# Patient Record
Sex: Male | Born: 1950 | Race: White | Hispanic: No | Marital: Married | Smoking: Current every day smoker
Health system: Southern US, Community
[De-identification: ages and names within clinical notes are randomized; demographics above are authoritative.]

## PROBLEM LIST (undated history)

## (undated) DIAGNOSIS — Z720 Tobacco use: Secondary | ICD-10-CM

## (undated) DIAGNOSIS — Z9289 Personal history of other medical treatment: Secondary | ICD-10-CM

## (undated) DIAGNOSIS — I4510 Unspecified right bundle-branch block: Secondary | ICD-10-CM

## (undated) DIAGNOSIS — K219 Gastro-esophageal reflux disease without esophagitis: Secondary | ICD-10-CM

## (undated) DIAGNOSIS — N200 Calculus of kidney: Secondary | ICD-10-CM

## (undated) DIAGNOSIS — K8309 Other cholangitis: Secondary | ICD-10-CM

## (undated) DIAGNOSIS — M1990 Unspecified osteoarthritis, unspecified site: Secondary | ICD-10-CM

## (undated) DIAGNOSIS — R55 Syncope and collapse: Secondary | ICD-10-CM

## (undated) DIAGNOSIS — E785 Hyperlipidemia, unspecified: Secondary | ICD-10-CM

## (undated) DIAGNOSIS — I2510 Atherosclerotic heart disease of native coronary artery without angina pectoris: Secondary | ICD-10-CM

## (undated) DIAGNOSIS — E119 Type 2 diabetes mellitus without complications: Secondary | ICD-10-CM

## (undated) DIAGNOSIS — T63311A Toxic effect of venom of black widow spider, accidental (unintentional), initial encounter: Secondary | ICD-10-CM

## (undated) DIAGNOSIS — R7309 Other abnormal glucose: Secondary | ICD-10-CM

## (undated) HISTORY — PX: EYE SURGERY: SHX253

## (undated) HISTORY — DX: Hyperlipidemia, unspecified: E78.5

## (undated) HISTORY — DX: Atherosclerotic heart disease of native coronary artery without angina pectoris: I25.10

## (undated) HISTORY — DX: Personal history of other medical treatment: Z92.89

## (undated) HISTORY — DX: Morbid (severe) obesity due to excess calories: E66.01

## (undated) HISTORY — DX: Type 2 diabetes mellitus without complications: E11.9

## (undated) HISTORY — DX: Unspecified right bundle-branch block: I45.10

## (undated) HISTORY — DX: Calculus of kidney: N20.0

## (undated) HISTORY — DX: Tobacco use: Z72.0

## (undated) HISTORY — DX: Gastro-esophageal reflux disease without esophagitis: K21.9

## (undated) HISTORY — DX: Other abnormal glucose: R73.09

## (undated) HISTORY — DX: Toxic effect of venom of black widow spider, accidental (unintentional), initial encounter: T63.311A

## (undated) HISTORY — DX: Other cholangitis: K83.09

## (undated) HISTORY — DX: Unspecified osteoarthritis, unspecified site: M19.90

---

## 1977-08-23 DIAGNOSIS — T63311A Toxic effect of venom of black widow spider, accidental (unintentional), initial encounter: Secondary | ICD-10-CM

## 1977-08-23 HISTORY — DX: Toxic effect of venom of black widow spider, accidental (unintentional), initial encounter: T63.311A

## 1997-12-21 DIAGNOSIS — E785 Hyperlipidemia, unspecified: Secondary | ICD-10-CM | POA: Insufficient documentation

## 1997-12-21 DIAGNOSIS — H9190 Unspecified hearing loss, unspecified ear: Secondary | ICD-10-CM | POA: Insufficient documentation

## 1997-12-21 DIAGNOSIS — E782 Mixed hyperlipidemia: Secondary | ICD-10-CM | POA: Insufficient documentation

## 1997-12-21 HISTORY — DX: Hyperlipidemia, unspecified: E78.5

## 1998-05-09 ENCOUNTER — Emergency Department (HOSPITAL_COMMUNITY): Admission: EM | Admit: 1998-05-09 | Discharge: 1998-05-09 | Payer: Self-pay | Admitting: Emergency Medicine

## 1998-07-23 HISTORY — PX: OTHER SURGICAL HISTORY: SHX169

## 1998-08-12 ENCOUNTER — Ambulatory Visit (HOSPITAL_COMMUNITY): Admission: RE | Admit: 1998-08-12 | Discharge: 1998-08-12 | Payer: Self-pay | Admitting: Family Medicine

## 1998-08-12 ENCOUNTER — Encounter: Payer: Self-pay | Admitting: Family Medicine

## 2004-05-23 ENCOUNTER — Encounter: Payer: Self-pay | Admitting: Family Medicine

## 2004-05-23 LAB — CONVERTED CEMR LAB: Hgb A1c MFr Bld: 5.5 %

## 2004-05-27 DIAGNOSIS — E119 Type 2 diabetes mellitus without complications: Secondary | ICD-10-CM | POA: Insufficient documentation

## 2004-06-08 ENCOUNTER — Observation Stay (HOSPITAL_COMMUNITY): Admission: EM | Admit: 2004-06-08 | Discharge: 2004-06-09 | Payer: Self-pay | Admitting: Emergency Medicine

## 2004-06-08 HISTORY — PX: OTHER SURGICAL HISTORY: SHX169

## 2006-07-29 ENCOUNTER — Ambulatory Visit: Payer: Self-pay | Admitting: Family Medicine

## 2006-08-23 ENCOUNTER — Encounter: Payer: Self-pay | Admitting: Family Medicine

## 2006-08-23 LAB — CONVERTED CEMR LAB: PSA: 0.29 ng/mL

## 2006-08-31 ENCOUNTER — Ambulatory Visit: Payer: Self-pay | Admitting: Family Medicine

## 2006-09-01 ENCOUNTER — Encounter: Payer: Self-pay | Admitting: Family Medicine

## 2006-09-01 LAB — CONVERTED CEMR LAB: Glucose, Bld: 84 mg/dL (ref 70–99)

## 2006-09-21 ENCOUNTER — Ambulatory Visit: Payer: Self-pay | Admitting: Family Medicine

## 2006-10-26 DIAGNOSIS — G5600 Carpal tunnel syndrome, unspecified upper limb: Secondary | ICD-10-CM | POA: Insufficient documentation

## 2007-01-13 ENCOUNTER — Ambulatory Visit: Payer: Self-pay | Admitting: Family Medicine

## 2007-01-13 DIAGNOSIS — J209 Acute bronchitis, unspecified: Secondary | ICD-10-CM

## 2007-07-11 ENCOUNTER — Ambulatory Visit (HOSPITAL_COMMUNITY): Admission: RE | Admit: 2007-07-11 | Discharge: 2007-07-11 | Payer: Self-pay | Admitting: Specialist

## 2007-12-06 ENCOUNTER — Encounter (INDEPENDENT_AMBULATORY_CARE_PROVIDER_SITE_OTHER): Payer: Self-pay | Admitting: Internal Medicine

## 2008-05-10 DIAGNOSIS — K625 Hemorrhage of anus and rectum: Secondary | ICD-10-CM

## 2008-05-15 ENCOUNTER — Ambulatory Visit: Payer: Self-pay | Admitting: Family Medicine

## 2008-05-15 DIAGNOSIS — N200 Calculus of kidney: Secondary | ICD-10-CM

## 2008-05-29 LAB — CONVERTED CEMR LAB
ALT: 28 units/L (ref 0–53)
AST: 21 units/L (ref 0–37)
Albumin: 4.2 g/dL (ref 3.5–5.2)
Alkaline Phosphatase: 65 units/L (ref 39–117)
BUN: 15 mg/dL (ref 6–23)
Basophils Absolute: 0.1 10*3/uL (ref 0.0–0.1)
Basophils Relative: 1 % (ref 0.0–3.0)
Bilirubin, Direct: 0.2 mg/dL (ref 0.0–0.3)
CO2: 30 meq/L (ref 19–32)
Calcium: 9.6 mg/dL (ref 8.4–10.5)
Chloride: 111 meq/L (ref 96–112)
Cholesterol: 173 mg/dL (ref 0–200)
Creatinine, Ser: 1.1 mg/dL (ref 0.4–1.5)
Eosinophils Absolute: 0.4 10*3/uL (ref 0.0–0.7)
Eosinophils Relative: 5.5 % — ABNORMAL HIGH (ref 0.0–5.0)
GFR calc Af Amer: 88 mL/min
GFR calc non Af Amer: 73 mL/min
Glucose, Bld: 105 mg/dL — ABNORMAL HIGH (ref 70–99)
HCT: 48.4 % (ref 39.0–52.0)
HDL: 28.8 mg/dL — ABNORMAL LOW (ref >39.0)
Hemoglobin: 17.3 g/dL — ABNORMAL HIGH (ref 13.0–17.0)
LDL Cholesterol: 125 mg/dL — ABNORMAL HIGH (ref 0–99)
Lymphocytes Relative: 18.4 % (ref 12.0–46.0)
MCHC: 35.6 g/dL (ref 30.0–36.0)
MCV: 94.9 fL (ref 78.0–100.0)
Monocytes Absolute: 0.7 10*3/uL (ref 0.1–1.0)
Monocytes Relative: 9.2 % (ref 3.0–12.0)
Neutro Abs: 4.6 10*3/uL (ref 1.4–7.7)
Neutrophils Relative %: 65.9 % (ref 43.0–77.0)
PSA: 0.54 ng/mL (ref 0.10–4.00)
Platelets: 144 10*3/uL — ABNORMAL LOW (ref 150–400)
Potassium: 4.7 meq/L (ref 3.5–5.1)
RBC: 5.11 M/uL (ref 4.22–5.81)
RDW: 12.2 % (ref 11.5–14.6)
Sodium: 143 meq/L (ref 135–145)
TSH: 1.85 microintl units/mL (ref 0.35–5.50)
Total Bilirubin: 1 mg/dL (ref 0.3–1.2)
Total CHOL/HDL Ratio: 6
Total Protein: 7.1 g/dL (ref 6.0–8.3)
Triglycerides: 95 mg/dL (ref 0–149)
VLDL: 19 mg/dL (ref 0–40)
WBC: 7.1 10*3/uL (ref 4.5–10.5)

## 2008-05-30 ENCOUNTER — Encounter: Payer: Self-pay | Admitting: Family Medicine

## 2008-05-30 DIAGNOSIS — F17200 Nicotine dependence, unspecified, uncomplicated: Secondary | ICD-10-CM

## 2008-06-03 ENCOUNTER — Encounter: Admission: RE | Admit: 2008-06-03 | Discharge: 2008-06-03 | Payer: Self-pay | Admitting: Internal Medicine

## 2008-11-01 ENCOUNTER — Inpatient Hospital Stay (HOSPITAL_COMMUNITY): Admission: EM | Admit: 2008-11-01 | Discharge: 2008-11-05 | Payer: Self-pay | Admitting: Emergency Medicine

## 2008-11-01 ENCOUNTER — Encounter (INDEPENDENT_AMBULATORY_CARE_PROVIDER_SITE_OTHER): Payer: Self-pay | Admitting: General Surgery

## 2008-11-25 ENCOUNTER — Encounter (INDEPENDENT_AMBULATORY_CARE_PROVIDER_SITE_OTHER): Payer: Self-pay | Admitting: Internal Medicine

## 2009-01-27 ENCOUNTER — Encounter (INDEPENDENT_AMBULATORY_CARE_PROVIDER_SITE_OTHER): Payer: Self-pay | Admitting: Internal Medicine

## 2009-02-13 ENCOUNTER — Ambulatory Visit (HOSPITAL_COMMUNITY): Admission: RE | Admit: 2009-02-13 | Discharge: 2009-02-14 | Payer: Self-pay | Admitting: Gastroenterology

## 2009-03-18 ENCOUNTER — Encounter (INDEPENDENT_AMBULATORY_CARE_PROVIDER_SITE_OTHER): Payer: Self-pay | Admitting: Internal Medicine

## 2009-04-10 ENCOUNTER — Ambulatory Visit (HOSPITAL_COMMUNITY): Admission: RE | Admit: 2009-04-10 | Discharge: 2009-04-10 | Payer: Self-pay | Admitting: Gastroenterology

## 2010-07-23 DIAGNOSIS — K8309 Other cholangitis: Secondary | ICD-10-CM

## 2010-07-23 HISTORY — DX: Other cholangitis: K83.09

## 2010-08-11 ENCOUNTER — Inpatient Hospital Stay (HOSPITAL_COMMUNITY)
Admission: EM | Admit: 2010-08-11 | Discharge: 2010-08-16 | Payer: Self-pay | Source: Home / Self Care | Attending: Internal Medicine | Admitting: Internal Medicine

## 2010-08-24 ENCOUNTER — Encounter: Payer: Self-pay | Admitting: Family Medicine

## 2010-09-19 NOTE — H&P (Signed)
NAME:  Carl Barnes, Carl Barnes NO.:  [id number]  MEDICAL RECORD NO.:  [id number]          PATIENT TYPE:  EMS  LOCATION:  MAJO                         FACILITY:  [facility]  PHYSICIAN:  Michiel Cowboy, MDDATE OF BIRTH:  03/18/1951  DATE OF ADMISSION:  08/12/2010 DATE OF DISCHARGE:                             HISTORY & PHYSICAL   PRIMARY PROVIDER:  Dr. Ether Griffins, she is not at [facility].  No primary physician in [place].  The patient has been seen by [facility].  CHIEF COMPLAINT:  Abdominal pain and feeling weak.  The patient is a 60 year old gentleman, status post cholecystectomy, but recurrent biliary obstruction requiring ERCP, history of kidney stones and gastroesophageal reflux.  The patient had been doing well up until yesterday when he developed low grade fevers, chills, intermittent left upper quadrant pain that rapidly resolved, pains and aches all over his body, myalgias and overall malaise, nausea and vomiting, although no diarrhea, no headache.  He had a little bit of sneezing and mild cough, but otherwise no other complaints.  Nobody in his family has been ill. He presented to the emergency department.  He states that the pain that he experienced yesterday did not seem to be similar to the pain he felt before when he had a biliary obstruction.  It did not seem to be similar to his biliary colic.  REVIEW OF SYSTEMS:  No chest pains, no shortness of breath, no diarrhea, no constipation.  No neurological complaints.  Otherwise, review of systems are negative.  PAST MEDICAL HISTORY: 1. Status post cholecystectomy. 2. Status post ERCP in August 2010, secondary to multiple common bile     duct stones. 3. History of GERD. 4. Remote history of kidney stone.  SOCIAL HISTORY:  The patient does not abuse drugs, but he does smoke about half pack a day.  He drinks about three drinks per week with his last drink being on Thursday.  FAMILY HISTORY:  Significant  for hypertension.  ALLERGIES:  CODEINE MAKES HIM NAUSEOUS.  MEDICATIONS:  He does not take any medications or supplements.  PHYSICAL EXAMINATION:  VITALS:  Temperature 97.9, blood pressure 97/55. His blood pressure reached 119/71, but it has drifted down since then, the latest one was 91/58.  Pulse 85, respirations 20.  Saturating 97% on room air. GENERAL:  The patient appears to be in no acute distress. HEENT:  Head nontraumatic.  Slightly dry mucous membranes.  Normal skin turgor. LUNGS:  Clear to auscultation bilaterally. HEART:  Regular rate and rhythm.  No murmurs appreciated. ABDOMEN:  Slightly obese, completely nontender, nondistended.  Normal bowel sounds appreciated.  LOWER EXTREMITIES:  Without clubbing, cyanosis or edema. NEUROLOGICALLY:  Grossly intact. SKIN:  Somewhat jaundiced, but mildly so.  LABORATORY DATA:  White blood cell count 14.6, hemoglobin 16.4, platelets 269.  Sodium 136, potassium 4.0, creatinine 1.6, INR 1.76, total bili of 5.2, direct of 3.1, AST 76, ALT 133, alk phos 79, albumin3.1, calcium 9.7, lactic acid 1.4.  UA is significant for nitrates and evidence of infection.  Blood cultures pending.  CT scan showing mild biliary dilatation, but otherwise no acute findings.  EKG showing heart rate of 75, perhaps early repolarization seen in lead V2.  Otherwise, no significant changes from prior.  ASSESSMENT/PLAN:  This is a 60 year old gentleman with a prior history of cholecystectomy, but recurrent biliary stones, who presented with mild abdominal pain, but overall malaise and abdominal pain completely resolved right now.  He was found to have a urinary tract infection and slightly dilated biliary ducts.  1. Abnormal liver function tests.  He has had transaminitis for quite     some time.  Interestingly, his alkaline phosphatase has not been     elevated.  He never really had a complete obstructive picture.  His     albumin had been low in the past and  today his INR is also high, as     well as he had some low platelets.  I am wondering if he has some     underlying liver disease.  He has had hepatic steatosis showing on     an ultrasound from a few years back.  We will for completion check     hepatitis serologies for his platelets and INR.  He may need to be     investigated by hematology as his platelets have been low for quite     some time now, although 69 is the lowest number it has gotten. 2. Urinary tract infection may have caused his current symptoms of     malaise.  We will cover with Zosyn in case he does have any biliary     system infection as well that should cover both urinary tract     infection and GI system.  We will give IV fluids and follow     him clinically. 3. History of biliary stones.  At this point, he has no Murphy's sign.     His abdomen is nontender.  He looks very comfortable.  He has no     fever.  I doubt that he has cholangitis, but his liver function     test abnormalities are surely there.  He will need a GI consult in     the a.m.  Dr. Matthias Hughs knows about him. 4. Slightly elevated creatinine.  We will give IV fluids and follow.     He does appear to be slightly dehydrated. 5. Generalized malaise and fatigue.  I wonder if this is related to     dehydration, though a viral infection could not be completely     excluded. 6. Hypertension.  We will give IV fluids and see how he responds.  We     will monitor in stepdown.  He does not appear to be toxic.  He is     not febrile or tachycardic at this point. 7. Prophylaxis.  We will write for Protonix and sequential compression     devices.  Be careful with Lovenox given his thrombocytopenia.     Michiel Cowboy, MD     AVD/MEDQ  D:  08/12/2010  T:  08/12/2010  Job:  [id number]  cc:   Bernette Redbird, M.D.  Electronically Signed by Therisa Doyne MD on 09/19/2010 06:12:14 AM

## 2010-09-24 NOTE — Miscellaneous (Signed)
  Clinical Lists Changes  Observations: Added new observation of PAST MED HX: Hyperlipidemia (12/21/1997) [facility] admission 12/11 for cholangitis with ERCP for stone removal per GI (08/24/2010 16:53)      Past History:  Past Medical History: Hyperlipidemia (12/21/1997) [facility] admission 12/11 for cholangitis with ERCP for stone removal per GI

## 2010-10-19 ENCOUNTER — Emergency Department (HOSPITAL_COMMUNITY)
Admission: EM | Admit: 2010-10-19 | Discharge: 2010-10-19 | Disposition: A | Discharge status: Home / Self Care | Payer: 59 | Attending: Emergency Medicine | Admitting: Emergency Medicine

## 2010-10-19 DIAGNOSIS — T63391A Toxic effect of venom of other spider, accidental (unintentional), initial encounter: Secondary | ICD-10-CM | POA: Insufficient documentation

## 2010-10-19 DIAGNOSIS — R42 Dizziness and giddiness: Secondary | ICD-10-CM | POA: Insufficient documentation

## 2010-10-19 DIAGNOSIS — T6391XA Toxic effect of contact with unspecified venomous animal, accidental (unintentional), initial encounter: Secondary | ICD-10-CM | POA: Insufficient documentation

## 2010-10-19 DIAGNOSIS — Y929 Unspecified place or not applicable: Secondary | ICD-10-CM | POA: Insufficient documentation

## 2010-10-19 DIAGNOSIS — K219 Gastro-esophageal reflux disease without esophagitis: Secondary | ICD-10-CM | POA: Insufficient documentation

## 2010-10-19 DIAGNOSIS — M79609 Pain in unspecified limb: Secondary | ICD-10-CM | POA: Insufficient documentation

## 2010-10-19 LAB — CBC
HCT: 48.5 % (ref 39.0–52.0)
Hemoglobin: 16.9 g/dL (ref 13.0–17.0)
MCH: 32.6 pg (ref 26.0–34.0)
MCHC: 34.8 g/dL (ref 30.0–36.0)
MCV: 93.6 fL (ref 78.0–100.0)
Platelets: 150 10*3/uL (ref 150–400)
RBC: 5.18 MIL/uL (ref 4.22–5.81)
RDW: 13.4 % (ref 11.5–15.5)
WBC: 8.3 10*3/uL (ref 4.0–10.5)

## 2010-10-19 LAB — DIFFERENTIAL
Basophils Absolute: 0.1 10*3/uL (ref 0.0–0.1)
Basophils Relative: 1 % (ref 0–1)
Eosinophils Absolute: 0.4 10*3/uL (ref 0.0–0.7)
Eosinophils Relative: 5 % (ref 0–5)
Lymphocytes Relative: 21 % (ref 12–46)
Lymphs Abs: 1.7 10*3/uL (ref 0.7–4.0)
Monocytes Absolute: 0.7 10*3/uL (ref 0.1–1.0)
Monocytes Relative: 9 % (ref 3–12)
Neutro Abs: 5.4 10*3/uL (ref 1.7–7.7)
Neutrophils Relative %: 66 % (ref 43–77)

## 2010-10-19 LAB — BASIC METABOLIC PANEL
Calcium: 9.8 mg/dL (ref 8.4–10.5)
Creatinine, Ser: 0.99 mg/dL (ref 0.4–1.5)
GFR calc Af Amer: >60 mL/min (ref >60)
GFR calc non Af Amer: >60 mL/min (ref >60)
Sodium: 141 mEq/L (ref 135–145)

## 2010-11-02 LAB — COMPREHENSIVE METABOLIC PANEL
ALT: 136 U/L — ABNORMAL HIGH (ref 0–53)
ALT: 43 U/L (ref 0–53)
ALT: 61 U/L — ABNORMAL HIGH (ref 0–53)
AST: 21 U/L (ref 0–37)
AST: 23 U/L (ref 0–37)
AST: 24 U/L (ref 0–37)
Albumin: 2.6 g/dL — ABNORMAL LOW (ref 3.5–5.2)
Albumin: 2.6 g/dL — ABNORMAL LOW (ref 3.5–5.2)
Albumin: 2.7 g/dL — ABNORMAL LOW (ref 3.5–5.2)
Alkaline Phosphatase: 72 U/L (ref 39–117)
Alkaline Phosphatase: 73 U/L (ref 39–117)
Alkaline Phosphatase: 80 U/L (ref 39–117)
BUN: 13 mg/dL (ref 6–23)
BUN: 13 mg/dL (ref 6–23)
BUN: 23 mg/dL (ref 6–23)
BUN: 30 mg/dL — ABNORMAL HIGH (ref 6–23)
CO2: 24 mEq/L (ref 19–32)
CO2: 24 mEq/L (ref 19–32)
CO2: 29 mEq/L (ref 19–32)
Calcium: 8.1 mg/dL — ABNORMAL LOW (ref 8.4–10.5)
Calcium: 8.5 mg/dL (ref 8.4–10.5)
Calcium: 8.6 mg/dL (ref 8.4–10.5)
Chloride: 104 mEq/L (ref 96–112)
Chloride: 105 mEq/L (ref 96–112)
Creatinine, Ser: 0.94 mg/dL (ref 0.4–1.5)
Creatinine, Ser: 1.04 mg/dL (ref 0.4–1.5)
GFR calc Af Amer: >60 mL/min (ref >60)
GFR calc Af Amer: >60 mL/min (ref >60)
GFR calc non Af Amer: 45 mL/min — ABNORMAL LOW (ref >60)
GFR calc non Af Amer: >60 mL/min (ref >60)
GFR calc non Af Amer: >60 mL/min (ref >60)
Glucose, Bld: 122 mg/dL — ABNORMAL HIGH (ref 70–99)
Glucose, Bld: 83 mg/dL (ref 70–99)
Potassium: 3.7 mEq/L (ref 3.5–5.1)
Potassium: 4.1 mEq/L (ref 3.5–5.1)
Potassium: 4.4 mEq/L (ref 3.5–5.1)
Sodium: 134 mEq/L — ABNORMAL LOW (ref 135–145)
Sodium: 136 mEq/L (ref 135–145)
Sodium: 138 mEq/L (ref 135–145)
Sodium: 138 mEq/L (ref 135–145)
Total Bilirubin: 2.5 mg/dL — ABNORMAL HIGH (ref 0.3–1.2)
Total Bilirubin: 5.3 mg/dL — ABNORMAL HIGH (ref 0.3–1.2)
Total Protein: 5.3 g/dL — ABNORMAL LOW (ref 6.0–8.3)
Total Protein: 5.7 g/dL — ABNORMAL LOW (ref 6.0–8.3)
Total Protein: 6 g/dL (ref 6.0–8.3)
Total Protein: 6.3 g/dL (ref 6.0–8.3)

## 2010-11-02 LAB — CBC
HCT: 37.2 % — ABNORMAL LOW (ref 39.0–52.0)
HCT: 37.5 % — ABNORMAL LOW (ref 39.0–52.0)
HCT: 38.1 % — ABNORMAL LOW (ref 39.0–52.0)
HCT: 39 % (ref 39.0–52.0)
HCT: 40 % (ref 39.0–52.0)
HCT: 41.3 % (ref 39.0–52.0)
HCT: 47 % (ref 39.0–52.0)
Hemoglobin: 12.6 g/dL — ABNORMAL LOW (ref 13.0–17.0)
Hemoglobin: 13.1 g/dL (ref 13.0–17.0)
Hemoglobin: 14.1 g/dL (ref 13.0–17.0)
Hemoglobin: 16.4 g/dL (ref 13.0–17.0)
MCH: 32.3 pg (ref 26.0–34.0)
MCH: 32.4 pg (ref 26.0–34.0)
MCHC: 33.6 g/dL (ref 30.0–36.0)
MCHC: 34 g/dL (ref 30.0–36.0)
MCHC: 34.1 g/dL (ref 30.0–36.0)
MCHC: 34.1 g/dL (ref 30.0–36.0)
MCHC: 34.9 g/dL (ref 30.0–36.0)
MCV: 94.4 fL (ref 78.0–100.0)
MCV: 94.7 fL (ref 78.0–100.0)
MCV: 95.6 fL (ref 78.0–100.0)
MCV: 95.7 fL (ref 78.0–100.0)
MCV: 96.4 fL (ref 78.0–100.0)
Platelets: 128 10*3/uL — ABNORMAL LOW (ref 150–400)
Platelets: 59 10*3/uL — ABNORMAL LOW (ref 150–400)
Platelets: 80 10*3/uL — ABNORMAL LOW (ref 150–400)
RBC: 3.89 MIL/uL — ABNORMAL LOW (ref 4.22–5.81)
RBC: 3.89 MIL/uL — ABNORMAL LOW (ref 4.22–5.81)
RBC: 4.06 MIL/uL — ABNORMAL LOW (ref 4.22–5.81)
RBC: 4.36 MIL/uL (ref 4.22–5.81)
RDW: 13.3 % (ref 11.5–15.5)
RDW: 13.4 % (ref 11.5–15.5)
RDW: 13.5 % (ref 11.5–15.5)
RDW: 13.7 % (ref 11.5–15.5)
WBC: 10.7 K/uL — ABNORMAL HIGH (ref 4.0–10.5)
WBC: 14.6 10*3/uL — ABNORMAL HIGH (ref 4.0–10.5)
WBC: 6.7 10*3/uL (ref 4.0–10.5)
WBC: 7 10*3/uL (ref 4.0–10.5)
WBC: 8.1 10*3/uL (ref 4.0–10.5)

## 2010-11-02 LAB — DIFFERENTIAL
Basophils Absolute: 0 10*3/uL (ref 0.0–0.1)
Basophils Absolute: 0 10*3/uL (ref 0.0–0.1)
Basophils Absolute: 0 10*3/uL (ref 0.0–0.1)
Basophils Absolute: 0 K/uL (ref 0.0–0.1)
Basophils Absolute: 0.1 10*3/uL (ref 0.0–0.1)
Basophils Relative: 0 % (ref 0–1)
Basophils Relative: 0 % (ref 0–1)
Basophils Relative: 0 % (ref 0–1)
Eosinophils Absolute: 0.2 10*3/uL (ref 0.0–0.7)
Eosinophils Absolute: 0.3 K/uL (ref 0.0–0.7)
Eosinophils Relative: 3 % (ref 0–5)
Eosinophils Relative: 3 % (ref 0–5)
Eosinophils Relative: 3 % (ref 0–5)
Eosinophils Relative: 6 % — ABNORMAL HIGH (ref 0–5)
Lymphocytes Relative: 11 % — ABNORMAL LOW (ref 12–46)
Lymphocytes Relative: 12 % (ref 12–46)
Lymphocytes Relative: 6 % — ABNORMAL LOW (ref 12–46)
Lymphs Abs: 0.6 10*3/uL — ABNORMAL LOW (ref 0.7–4.0)
Monocytes Absolute: 0.5 10*3/uL (ref 0.1–1.0)
Monocytes Absolute: 0.8 K/uL (ref 0.1–1.0)
Monocytes Relative: 14 % — ABNORMAL HIGH (ref 3–12)
Monocytes Relative: 7 % (ref 3–12)
Neutro Abs: 12.8 10*3/uL — ABNORMAL HIGH (ref 1.7–7.7)
Neutro Abs: 4.7 10*3/uL (ref 1.7–7.7)
Neutro Abs: 4.9 10*3/uL (ref 1.7–7.7)
Neutro Abs: 6.3 10*3/uL (ref 1.7–7.7)
Neutro Abs: 8.9 10*3/uL — ABNORMAL HIGH (ref 1.7–7.7)
Neutrophils Relative %: 67 % (ref 43–77)
Neutrophils Relative %: 84 % — ABNORMAL HIGH (ref 43–77)
Neutrophils Relative %: 88 % — ABNORMAL HIGH (ref 43–77)

## 2010-11-02 LAB — LIPID PANEL
Cholesterol: 112 mg/dL (ref 0–200)
HDL: 26 mg/dL — ABNORMAL LOW (ref >39)
LDL Cholesterol: 43 mg/dL (ref 0–99)
Total CHOL/HDL Ratio: 4.3 ratio
Triglycerides: 217 mg/dL — ABNORMAL HIGH (ref <150)
VLDL: 43 mg/dL — ABNORMAL HIGH (ref 0–40)

## 2010-11-02 LAB — BASIC METABOLIC PANEL
BUN: 29 mg/dL — ABNORMAL HIGH (ref 6–23)
Calcium: 8.5 mg/dL (ref 8.4–10.5)
Calcium: 9.7 mg/dL (ref 8.4–10.5)
Chloride: 102 mEq/L (ref 96–112)
Chloride: 107 mEq/L (ref 96–112)
Creatinine, Ser: 0.9 mg/dL (ref 0.4–1.5)
Creatinine, Ser: 1.59 mg/dL — ABNORMAL HIGH (ref 0.4–1.5)
GFR calc Af Amer: 54 mL/min — ABNORMAL LOW (ref >60)
GFR calc Af Amer: >60 mL/min (ref >60)
GFR calc non Af Amer: 45 mL/min — ABNORMAL LOW (ref >60)

## 2010-11-02 LAB — HEPATIC FUNCTION PANEL
AST: 24 U/L (ref 0–37)
Albumin: 2.4 g/dL — ABNORMAL LOW (ref 3.5–5.2)
Albumin: 3.1 g/dL — ABNORMAL LOW (ref 3.5–5.2)
Alkaline Phosphatase: 79 U/L (ref 39–117)
Alkaline Phosphatase: 83 U/L (ref 39–117)
Indirect Bilirubin: 2.1 mg/dL — ABNORMAL HIGH (ref 0.3–0.9)
Total Protein: 5.7 g/dL — ABNORMAL LOW (ref 6.0–8.3)
Total Protein: 6 g/dL (ref 6.0–8.3)

## 2010-11-02 LAB — COMPREHENSIVE METABOLIC PANEL WITH GFR
ALT: 95 U/L — ABNORMAL HIGH (ref 0–53)
AST: 45 U/L — ABNORMAL HIGH (ref 0–37)
Alkaline Phosphatase: 70 U/L (ref 39–117)
CO2: 24 meq/L (ref 19–32)
Chloride: 108 meq/L (ref 96–112)
Creatinine, Ser: 1.16 mg/dL (ref 0.4–1.5)
GFR calc Af Amer: >60 mL/min (ref >60)
GFR calc non Af Amer: >60 mL/min (ref >60)
Potassium: 4 meq/L (ref 3.5–5.1)
Total Bilirubin: 3.5 mg/dL — ABNORMAL HIGH (ref 0.3–1.2)

## 2010-11-02 LAB — URINE MICROSCOPIC-ADD ON

## 2010-11-02 LAB — CULTURE, BLOOD (ROUTINE X 2): Culture  Setup Time: 201112210841

## 2010-11-02 LAB — HEPATITIS A ANTIBODY, IGM: Hep A IgM: NEGATIVE

## 2010-11-02 LAB — URINALYSIS, ROUTINE W REFLEX MICROSCOPIC
Nitrite: POSITIVE — AB
Protein, ur: NEGATIVE mg/dL
Specific Gravity, Urine: 1.027 (ref 1.005–1.030)
Urobilinogen, UA: 1 mg/dL (ref 0.0–1.0)

## 2010-11-02 LAB — GAMMA GT: GGT: 150 U/L — ABNORMAL HIGH (ref 7–51)

## 2010-11-02 LAB — PROTIME-INR
INR: 1.7 — ABNORMAL HIGH (ref 0.00–1.49)
INR: 1.76 — ABNORMAL HIGH (ref 0.00–1.49)
Prothrombin Time: 20.2 seconds — ABNORMAL HIGH (ref 11.6–15.2)
Prothrombin Time: 20.7 seconds — ABNORMAL HIGH (ref 11.6–15.2)

## 2010-11-02 LAB — HEPATITIS B E ANTIBODY: Hep B E Ab: NONREACTIVE

## 2010-11-02 LAB — MAGNESIUM: Magnesium: 2 mg/dL (ref 1.5–2.5)

## 2010-11-02 LAB — LACTIC ACID, PLASMA: Lactic Acid, Venous: 1.4 mmol/L (ref 0.5–2.2)

## 2010-11-02 LAB — APTT: aPTT: 33 seconds (ref 24–37)

## 2010-11-02 LAB — MRSA PCR SCREENING: MRSA by PCR: NEGATIVE

## 2010-11-02 LAB — HEPATITIS B E ANTIGEN: Hep B E Ag: NONREACTIVE

## 2010-11-02 LAB — TYPE AND SCREEN: Antibody Screen: NEGATIVE

## 2010-11-02 LAB — LIPASE, BLOOD: Lipase: 25 U/L (ref 11–59)

## 2010-11-02 LAB — TSH: TSH: 4.449 u[IU]/mL (ref 0.350–4.500)

## 2010-11-02 LAB — HEPATITIS B CORE ANTIBODY, TOTAL: Hep B Core Total Ab: NEGATIVE

## 2010-11-02 LAB — HEPATITIS C ANTIBODY: HCV Ab: NEGATIVE

## 2010-11-02 LAB — PHOSPHORUS: Phosphorus: 2.8 mg/dL (ref 2.3–4.6)

## 2010-11-30 LAB — COMPREHENSIVE METABOLIC PANEL
ALT: 34 U/L (ref 0–53)
ALT: 72 U/L — ABNORMAL HIGH (ref 0–53)
Albumin: 2.9 g/dL — ABNORMAL LOW (ref 3.5–5.2)
Alkaline Phosphatase: 98 U/L (ref 39–117)
BUN: 10 mg/dL (ref 6–23)
CO2: 28 mEq/L (ref 19–32)
Calcium: 8.9 mg/dL (ref 8.4–10.5)
Chloride: 105 mEq/L (ref 96–112)
Chloride: 111 mEq/L (ref 96–112)
Creatinine, Ser: 0.9 mg/dL (ref 0.4–1.5)
GFR calc non Af Amer: >60 mL/min (ref >60)
Glucose, Bld: 100 mg/dL — ABNORMAL HIGH (ref 70–99)
Potassium: 4 mEq/L (ref 3.5–5.1)
Sodium: 136 mEq/L (ref 135–145)
Total Bilirubin: 1.4 mg/dL — ABNORMAL HIGH (ref 0.3–1.2)
Total Bilirubin: 1.5 mg/dL — ABNORMAL HIGH (ref 0.3–1.2)
Total Protein: 5.4 g/dL — ABNORMAL LOW (ref 6.0–8.3)

## 2010-11-30 LAB — CBC
HCT: 42.2 % (ref 39.0–52.0)
Hemoglobin: 14.6 g/dL (ref 13.0–17.0)
Hemoglobin: 15.8 g/dL (ref 13.0–17.0)
MCHC: 34.2 g/dL (ref 30.0–36.0)
MCV: 94.1 fL (ref 78.0–100.0)
Platelets: 111 10*3/uL — ABNORMAL LOW (ref 150–400)
RBC: 4.91 MIL/uL (ref 4.22–5.81)
WBC: 8 10*3/uL (ref 4.0–10.5)

## 2010-12-03 LAB — URINALYSIS, ROUTINE W REFLEX MICROSCOPIC
Hgb urine dipstick: NEGATIVE
Leukocytes, UA: NEGATIVE
Nitrite: NEGATIVE
Specific Gravity, Urine: 1.028 (ref 1.005–1.030)
Urobilinogen, UA: 1 mg/dL (ref 0.0–1.0)

## 2010-12-03 LAB — COMPREHENSIVE METABOLIC PANEL
ALT: 49 U/L (ref 0–53)
ALT: 72 U/L — ABNORMAL HIGH (ref 0–53)
ALT: 88 U/L — ABNORMAL HIGH (ref 0–53)
AST: 27 U/L (ref 0–37)
AST: 62 U/L — ABNORMAL HIGH (ref 0–37)
Albumin: 2.4 g/dL — ABNORMAL LOW (ref 3.5–5.2)
Albumin: 2.7 g/dL — ABNORMAL LOW (ref 3.5–5.2)
Alkaline Phosphatase: 101 U/L (ref 39–117)
Alkaline Phosphatase: 106 U/L (ref 39–117)
BUN: 8 mg/dL (ref 6–23)
BUN: 15 mg/dL (ref 6–23)
CO2: 26 mEq/L (ref 19–32)
CO2: 23 mEq/L (ref 19–32)
CO2: 23 mEq/L (ref 19–32)
CO2: 25 mEq/L (ref 19–32)
Calcium: 8.6 mg/dL (ref 8.4–10.5)
Chloride: 102 mEq/L (ref 96–112)
Chloride: 105 mEq/L (ref 96–112)
Creatinine, Ser: 0.98 mg/dL (ref 0.4–1.5)
Creatinine, Ser: 0.88 mg/dL (ref 0.4–1.5)
GFR calc Af Amer: >60 mL/min (ref >60)
GFR calc Af Amer: >60 mL/min (ref >60)
GFR calc non Af Amer: >60 mL/min (ref >60)
GFR calc non Af Amer: >60 mL/min (ref >60)
GFR calc non Af Amer: >60 mL/min (ref >60)
GFR calc non Af Amer: >60 mL/min (ref >60)
Glucose, Bld: 91 mg/dL (ref 70–99)
Glucose, Bld: 102 mg/dL — ABNORMAL HIGH (ref 70–99)
Potassium: 4 mEq/L (ref 3.5–5.1)
Potassium: 4.3 mEq/L (ref 3.5–5.1)
Sodium: 138 mEq/L (ref 135–145)
Sodium: 135 mEq/L (ref 135–145)
Sodium: 136 mEq/L (ref 135–145)
Sodium: 137 mEq/L (ref 135–145)
Total Bilirubin: 14 mg/dL — ABNORMAL HIGH (ref 0.3–1.2)
Total Bilirubin: 4.4 mg/dL — ABNORMAL HIGH (ref 0.3–1.2)
Total Bilirubin: 9.6 mg/dL — ABNORMAL HIGH (ref 0.3–1.2)
Total Protein: 5.8 g/dL — ABNORMAL LOW (ref 6.0–8.3)
Total Protein: 5.6 g/dL — ABNORMAL LOW (ref 6.0–8.3)

## 2010-12-03 LAB — HEPATIC FUNCTION PANEL
ALT: 69 U/L — ABNORMAL HIGH (ref 0–53)
AST: 47 U/L — ABNORMAL HIGH (ref 0–37)
Albumin: 3.6 g/dL (ref 3.5–5.2)
Bilirubin, Direct: 0.4 mg/dL — ABNORMAL HIGH (ref 0.0–0.3)

## 2010-12-03 LAB — CULTURE, BLOOD (ROUTINE X 2): Culture: NO GROWTH

## 2010-12-03 LAB — CBC
HCT: 36.5 % — ABNORMAL LOW (ref 39.0–52.0)
Hemoglobin: 12.7 g/dL — ABNORMAL LOW (ref 13.0–17.0)
Hemoglobin: 16 g/dL (ref 13.0–17.0)
Platelets: 103 10*3/uL — ABNORMAL LOW (ref 150–400)
Platelets: 128 10*3/uL — ABNORMAL LOW (ref 150–400)
RBC: 3.66 MIL/uL — ABNORMAL LOW (ref 4.22–5.81)
RBC: 3.83 MIL/uL — ABNORMAL LOW (ref 4.22–5.81)
RBC: 4.07 MIL/uL — ABNORMAL LOW (ref 4.22–5.81)
RBC: 4.81 MIL/uL (ref 4.22–5.81)
RDW: 12.8 % (ref 11.5–15.5)
RDW: 13.6 % (ref 11.5–15.5)
WBC: 10.1 10*3/uL (ref 4.0–10.5)
WBC: 6.7 10*3/uL (ref 4.0–10.5)

## 2010-12-03 LAB — POCT CARDIAC MARKERS
CKMB, poc: <1 ng/mL — ABNORMAL LOW (ref 1.0–8.0)
Troponin i, poc: <0.05 ng/mL (ref 0.00–0.09)

## 2010-12-03 LAB — ABO/RH: ABO/RH(D): A NEG

## 2010-12-03 LAB — DIFFERENTIAL
Basophils Absolute: 0 10*3/uL (ref 0.0–0.1)
Lymphocytes Relative: 6 % — ABNORMAL LOW (ref 12–46)
Monocytes Absolute: 0.9 10*3/uL (ref 0.1–1.0)
Neutro Abs: 7.1 10*3/uL (ref 1.7–7.7)

## 2010-12-03 LAB — BASIC METABOLIC PANEL
Calcium: 8.8 mg/dL (ref 8.4–10.5)
GFR calc Af Amer: >60 mL/min (ref >60)
GFR calc non Af Amer: >60 mL/min (ref >60)
Glucose, Bld: 165 mg/dL — ABNORMAL HIGH (ref 70–99)
Sodium: 135 mEq/L (ref 135–145)

## 2010-12-03 LAB — URINE MICROSCOPIC-ADD ON

## 2010-12-03 LAB — TYPE AND SCREEN

## 2011-01-05 NOTE — Discharge Summary (Signed)
NAME:  [name], Carl Barnes                ACCOUNT NO.:  [id number]   MEDICAL RECORD NO.:  [id number]          PATIENT TYPE:  INP   LOCATION:  5153                         FACILITY:  [facility]   PHYSICIAN:  Currie Paris, M.D.DATE OF BIRTH:  26-Sep-1950   DATE OF ADMISSION:  10/31/2008  DATE OF DISCHARGE:                               DISCHARGE SUMMARY   ADDENDUM  The patient also has a JP drain in place.  Upon examination of this at  time of discharge, the output and the drain seems somewhat bilious and  therefore we will leave the JP drain in place and have the patient  follow up with Dr. Johna Sheriff in 1 week instead of 2 weeks for possible  drain removal at that time.      Letha Cape, PA      Currie Paris, M.D.  Electronically Signed    KEO/MEDQ  D:  11/05/2008  T:  11/06/2008  Job:  [id number]   cc:   Lorne Skeens. Hoxworth, M.D.  Dr. Madilyn Fireman

## 2011-01-05 NOTE — Op Note (Signed)
NAME:  Carl Barnes, Carl Barnes                ACCOUNT NO.:  [id number]   MEDICAL RECORD NO.:  [id number]          PATIENT TYPE:  INP   LOCATION:  5153                         FACILITY:  [facility]   PHYSICIAN:  John C. Madilyn Fireman, M.D.    DATE OF BIRTH:  May 30, 1951   DATE OF PROCEDURE:  11/03/2008  DATE OF DISCHARGE:                               OPERATIVE REPORT   SURGEON:  John C. Madilyn Fireman, MD   OPERATIONS:  Endoscopic retrograde cholangiopancreatography with  sphincterotomy, stone extraction, and stent placement.   INDICATIONS FOR PROCEDURE:  Retained common bile duct stones seen at the  time of laparoscopic cholecystectomy.  ERCP yesterday was only partially  successful, but the patient became combative and we could not remove a  fairly large stone that was seen.  Procedure is repeated today under  general anesthesia.   PROCEDURE:  The patient was placed in the prone position and placed on  the pulse monitor and was under continuous OR monitoring.  He was  intubated and sedated with general anesthesia.  The Olympus video side-  viewing endoscope was advanced blindly into the oropharynx, esophagus,  and stomach.  The pylorus was traversed and the papilla of Vater located  on the medial duodenal wall.  There was evidence of yesterday's  sphincterotomy.  The common duct was cannulated with a Wilson-Cook  sphincterotome, and a cholangiogram was obtained.  This showed what  appeared to be single large filling defect.  The guidewire was anchored  and balloon catheter was used to try to remove the stone, but as of  yesterday, this was unsuccessful.  We then used 2 size baskets but had  difficulty trapping the stones and could not keep the stone trapped.  There were one or two times where it was felt to be within the basket  but when the basket was pulled through, the stone was still in the duct.  Finally,  we enlarged this sphincterotomy and then repeated a 12-15 mm  balloon sweep and removed 2 or 3 dark,  black, hard fragments but the  main body of the stone remained in place.  Once more, we used a basket  catheter and eventually used a 4, 5, and 6-cm catheters all without  further success in removing the stone.  Finally, it was elected to place  a plastic stent for drainage and this was done with a 8.5 French 5 cm  stent with good bile drainage at the time of the termination of the  procedure.  The scope was then withdrawn, and the patient returned to  the recovery room in stable condition.  He tolerated the procedure well.  There were no immediate complications.   IMPRESSION:  Retained common bile duct stone, could not completely  remove all stones status post stent placement.   PLAN:  We will treat with Actigall and repeat ERCP in 2-3 months and try  to remove the remaining stones.           ______________________________  Everardo All Madilyn Fireman, M.D.     JCH/MEDQ  D:  11/03/2008  T:  11/04/2008  Job:  [id number]   cc:   Lorne Skeens. Hoxworth, M.D.

## 2011-01-05 NOTE — Op Note (Signed)
NAME:  Carl Barnes, Carl Barnes                ACCOUNT NO.:  [id number]   MEDICAL RECORD NO.:  [id number]          PATIENT TYPE:  OBV   LOCATION:  5120                         FACILITY:  [facility]   PHYSICIAN:  Sharlet Salina T. Hoxworth, M.D.DATE OF BIRTH:  Nov 29, 1950   DATE OF PROCEDURE:  11/01/2008  DATE OF DISCHARGE:                               OPERATIVE REPORT   PREOPERATIVE DIAGNOSES:  Cholelithiasis and acute cholecystitis.   POSTOPERATIVE DIAGNOSES:  Cholelithiasis and acute cholecystitis.   SURGICAL PROCEDURES:  Laparoscopic cholecystectomy with intraoperative  cholangiogram.   SURGEON:  Sharlet Salina T. Hoxworth, MD   ASSISTANT:  Gabrielle Dare. Janee Morn, MD   ANESTHESIA:  General.   BRIEF HISTORY:  Carl Barnes is a 60 year old male who presents with what  he states just about a week of intermittent epigastric and right upper  quadrant pain.  This became more constant and severe over the past 24  hours and he presented to the emergency room.  Workup included a  gallbladder ultrasound showing cholelithiasis and probably some  thickening of the gallbladder wall consistent with cholecystitis.  CT  scan of the abdomen revealed an abnormal-appearing gallbladder with some  thickening and probable stones and also there was an indeterminate  approximately 2-3 cm posterior right liver lesion.  The patient  continues to have pain.  A HIDA scan was performed earlier this morning  that showed nonvisualization of the gallbladder after an hour and a  half.  I have recommended proceeding with laparoscopic cholecystectomy  with cholangiogram for apparent acute cholecystitis.  He also has mildly  dilated common bile duct and mildly elevated LFTs and cholangiogram will  be obtained to rule out common duct stones.  The nature of procedure,  indications, risks of bleeding, infection, bile leak, bile duct injury,  possible need for open procedure were discussed and understood.  He was  now brought to the operating room  for this procedure.   DESCRIPTION OF OPERATION:  The patient was brought to the operating  room, placed in supine position on the operating table and general  endotracheal anesthesia was induced.  The abdomen was widely sterilely  prepped and draped.  He received preoperative IV antibiotics.  Correct  patient procedure were verified.  A curvilinear incision was made  beneath the umbilicus.  There was a small umbilical hernia and I  dissected the umbilical skin up off the hernia sac.  The hernia sac was  excised.  The fascial edge was exposed in all directions.  Two 0 Vicryl  sutures were placed on either end of the transverse opening and the  Hasson trocar inserted, pneumoperitoneum established.  Under direct  vision, an 11-mm trocar was placed in the subxiphoid area and two 5-mm  trocar was placed on the right subcostal margin.  The omentum was  adherent up to the gallbladder and as we stripped this away, the very  top of the fundus of the gallbladder was essentially necrotic and  severely, acutely inflamed.  We were able to grasp this and elevated and  then the gallbladder was both acutely and severely subacutely inflamed  with  induration and very firm.  However, we were able to strip the  omentum off of somewhat more normal-appearing body of the gallbladder  and then this was able to be grasped and elevated.  Beginning of what  appeared to be well up on the gallbladder wall, we dissected through  some peritoneum and fibrofatty tissue.  There was essentially encasing  the entire gallbladder along with severe inflammation and dissected down  onto the gallbladder wall.  This was then carefully followed distally  staying on the gallbladder wall with slow careful dissection.  However,  as I continued to dissect distally even though we were what appeared to  be well up on the liver, the duodenum was adherent up to the area and I  was concerned that possibly we were dissecting down onto the  common bile  duct.  We went back up onto what look-like gallbladder wall and  dissecting into the gallbladder wall over toward the liver, I was able  to encircle a dilated cystic duct.  The gallbladder was actually very  foreshortened, pulling the common bile duct up and we had dissected down  for about a centimeter or centimeter and half onto the anterior wall of  the common bile duct, which was dilated, but there did not appear to be  any injury to the common bile duct at all.  At this point, we were able  to encircle a very short dilated cystic duct.  I then obtained an  operative cholangiogram by making a small opening up on the cystic duct  toward the gallbladder and placing a Reddick catheter with slight  inflation on the balloon and a clip.  The cholangiogram revealed a  moderately dilated intrahepatic and common bile duct with no flow into  the duodenum and a large distal common bile duct stone.  I then  attempted a transcystic duct exploration with a Fogarty catheter, which  was able to be easily passed through the cystic duct into the common  bile duct and distally through the ampulla and with several pull  throughs, I was not able to retrieve the stone, which appeared quite  large.  I then repeated the cholangiogram, which showed the stone have  been pulled back into the common hepatic duct just proximal to the  cystic duct, but I was unable to retrieve it.  I elected to leave this  for removal with ERCP.  The cystic duct was little bit too broad to clip  and therefore, I divided it up at the gallbladder and then with Dr.  Janee Morn retractor it laterally, it was encircled with a PDS Endoloop  and securely tied, essentially flush with the common bile duct.  Following this, the gallbladder was dissected free from its bed.  It was  extremely indurated, hard, chronically and acutely inflamed, but the  dissection progressed relatively well.  It was detached from the liver,  placed  an EndoCatch bag and brought out through the umbilicus.  The  right upper quadrant was then thoroughly irrigated and hemostasis  obtained in the gallbladder bed.  I did leave a closed suction drain in  the gallbladder bed and down to [employer].  Surgicel pack was left  in the gallbladder bed.  Following this, all CO2 was evacuated and  trocar was removed.  The fascial defect in the umbilicus was closed with  the previously placed sutures plus a couple of additional 0 Prolenes  placed transversely.  Skin incisions were closed with staples.  Sponge  and needle counts were correct.  Dry dressings were applied.  The  patient was taken to recovery in good condition.      Lorne Skeens. Hoxworth, M.D.  Electronically Signed     BTH/MEDQ  D:  11/01/2008  T:  11/02/2008  Job:  [id number]

## 2011-01-05 NOTE — H&P (Signed)
NAME:  Carl Barnes, Carl Barnes NO.:  [id number]   MEDICAL RECORD NO.:  [id number]          PATIENT TYPE:  EMS   LOCATION:  MAJO                         FACILITY:  [facility]   PHYSICIAN:  Lennie Muckle, MD      DATE OF BIRTH:  March 10, 1951   DATE OF ADMISSION:  10/31/2008  DATE OF DISCHARGE:                              HISTORY & PHYSICAL   CHIEF COMPLAINT:  Chest and abdominal pain.   HISTORY OF PRESENT ILLNESS:  [name] is a 60 year old male who had  onset of right lower quadrant pain as well as chest tightness and back  pain on Monday.  This was approximately 5 days ago.  He had been around  a sick child at home and accompanies that.  He had associated nausea and  vomiting on Monday.  He had no aggravating factors.  He was better when  he received the narcotics in the emergency department.  He has had no  fevers or chills at home.  He has had some arthralgias and headache.  CT  scan was performed, which revealed some hyperemia around the gallbladder  fossa and a mass in the posterior right lobe of the liver.  Ultrasound  was also performed, which showed thickened wall.  No Murphy sign, but he  did have stones and had intra and extrahepatic ductal dilatation.  He  states he called his primary care doctor yesterday, and was given Cipro  as well as narcotics and Phenergan.  He has had multiple episodes of  having doubled over due to his pain.   PAST MEDICAL HISTORY:  Nephrolithiasis.   FAMILY HISTORY:  Pancreatic cancer.   SOCIAL HISTORY:  Smokes approximately a pack a day.  Occasional alcohol.   ALLERGIES:  DEMEROL causes hallucinations.   MEDICATIONS:  Phenergan, Cipro, and pain medicine.   REVIEW OF SYSTEMS:  Negative.   PHYSICAL EXAMINATION:  VITAL SIGNS:  Temperature is 100.1, pulse 70,  respiratory rate 20, and blood pressure 112/68.  HEENT:  Head is normocephalic.  Sclerae are clear.  Extraocular muscles  are intact.  Oral mucosa is moist.  CHEST:  Clear to  auscultation bilaterally.  CARDIOVASCULAR:  Regular rate and rhythm.  ABDOMEN:  Protuberant  He has some tenderness to palpation in the right  upper quadrant and in the right lower quadrant.  No pain in the left  lower quadrant.  No peritoneal signs.  EXTREMITIES:  No deformity.  SKIN:  No jaundice or rashes are seen.  NEUROLOGIC:  I see no deficits on examination.   IMAGING:  CT revealed there is a somewhat thickened wall with some edema  noted around the gallbladder.  There is a lesion on the right posterior  lobe of the liver.  Ultrasound is also with some mild thickening of the  gallbladder wall.   LABORATORY DATA:  White count is normal at 8.6, hemoglobin and  hematocrit 16 and 48.  Bilirubin is mildly elevated at 11.3.  Liver  enzymes, AST and ALT are 47 and 69.  BUN and creatinine 12 and 1.2.   ASSESSMENT AND PLAN:  Abdominal pain with perhaps an etiology of  cholecystitis.  We will plan on admitting the patient for observation.  We will obtain a HIDA scan to see if this indeed does not feel more  towards cholelithiasis.  I discussed with the patient and his wife that  his mass lesion  is not an emergency and therefore could wait until  after his surgery.  If his HIDA is negative, then we will likely proceed  to the operating room.  I will discuss the case with Dr. Johna Sheriff, and  see if he has any thoughts on the process.  I do not think he needs any  coverage with antibiotics at the present time, but we will wait on his  HIDA.  We will attempt to have VIR assess with possible liver biopsy.      Lennie Muckle, MD  Electronically Signed     ALA/MEDQ  D:  11/01/2008  T:  11/01/2008  Job:  [id number]

## 2011-01-05 NOTE — Discharge Summary (Signed)
NAME:  Carl Barnes, Carl Barnes                ACCOUNT NO.:  [id number]   MEDICAL RECORD NO.:  [id number]          PATIENT TYPE:  INP   LOCATION:  5153                         FACILITY:  [facility]   PHYSICIAN:  Currie Paris, M.D.DATE OF BIRTH:  08-Feb-1951   DATE OF ADMISSION:  10/31/2008  DATE OF DISCHARGE:  11/05/2008                               DISCHARGE SUMMARY   DISCHARGING PHYSICIAN:  Currie Paris, M.D.   PROCEDURES:  Laparoscopic cholecystectomy with intraoperative  cholangiogram by Dr. Johna Sheriff on November 01, 2008, as well as an ERCP with  sphincterotomy, stone extraction, and stent placement by Dr. Madilyn Fireman on  November 03, 2008.   CONSULTANTS:  John C. Madilyn Fireman, M.D., Gastroenterology.   REASON FOR ADMISSION:  [name] is a 60 year old male who had an acute  onset of right lower quadrant abdominal pain with chest tightness and  pain in his back.  This was approximately 5 days prior to admission.  He  had been around a sick child and so associated his nausea and vomiting  with GI above.  However, he continued to have pain and therefore  presented to the emergency department where he had a CT scan performed,  which revealed some hyperemia around the gallbladder fossa and a mass in  the posterior right lobe of the liver.  He then had an ultrasound, which  showed a thickened gallbladder wall with stones and some intra and  extrahepatic ductal dilation.  At this time, we will call to admit the  patient.  Please admitting history and physical for further details.   ADMITTING DIAGNOSES:  1. Acute cholecystitis.  2. Hepatic lesion found on CT scan.   HOSPITAL COURSE:  At this time, the patient was admitted and placed on  IV Zosyn and made n.p.o.  Later that day, the patient was taken to the  operating room where a laparoscopic cholecystectomy was performed.  An  IOC was performed, which showed a common bile duct stone.  The patient  tolerated this procedure well and status post  procedure, the  Gastroenterology was consulted for a postoperative ERCP.  On  postoperative day 1, the patient was having a minimal pain and doing  fairly well.  He then underwent his ERCP, through which the stones could  not be extracted due to the patient bucking and being combative during  the procedure.  At this time, on the following day, another ERCP was  scheduled, for which he underwent this with general endotracheal  anesthesia.  At this time, the ERCP went fairly well and several of the  stones were extracted; however, there was one large stone that could not  be removed, and at this time, a stent was placed.  Also of note on this  day, the patient's total bilirubin had increased to 14.  By the  following day, which was postoperative day 3, the patient was doing well  without any complaints except for soreness.  At this time, he was  tolerating a clear liquid diet.  All of his liver function tests were  normal except for his total bilirubin, which  at this point had come down  to 4.4.  At this time, the patient's diet was advanced as tolerated and  by the following day postoperative day 4, the patient was otherwise  doing well and tolerating a regular diet with a total bilirubin now of  3.4.  At this time, the patient was felt stable for discharge home by  the Surgery and Gastroenterology.   On admission, the patient was found to have a liver lesion on CT scan  and during his hospitalization, the MRI was done to further clarify this  etiology.  Currently, at the time of discharge, the MRI has been done,  however, not read and results are pending.   DISCHARGE DIAGNOSES:  1. Acute cholecystitis.  2. Choledocholithiasis.  3. Cholelithiasis.  4. Status post laparoscopic cholecystectomy with intraoperative      cholangiogram.  5. Status post 2 ERCPs.  6. Liver lesion, unknown pathology.   DISCHARGE MEDICATIONS:  The patient is informed that he may resume  taking his Protonix  40 mg p.o. daily as needed as well as his Phenergan  25 mg as needed for nausea.  He is informed to stop taking his Cipro 500  mg.  He is given a prescription for Percocet 5/325 1-2 p.o. q.4 h.  p.r.n. pain.   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  He does not need to do any heavy lifting greater than 15 pounds for the  next 2 weeks.  He may shower, however, he is not to bathe for the next 2  weeks.  He can increase his activities slowly and he may walk up steps.  He has no dietary restrictions and no wound care.  He is to call our  office for fever greater than 101.5 or worsening abdominal pain.  He is  to return to see Dr. Madilyn Fireman with [facility] on November 25, 2008,  and he is to call to be seen by Dr. Johna Sheriff in the office in 2 weeks.  Of note, [facility], Dr. Johna Sheriff, to follow up on MRI  results for the patient's liver lesion.      Carl Cape, PA      Currie Paris, M.D.  Electronically Signed    KEO/MEDQ  D:  11/05/2008  T:  11/06/2008  Job:  [id number]   cc:   Everardo All. Madilyn Fireman, M.D.  Lorne Skeens. Hoxworth, M.D.

## 2011-01-05 NOTE — Op Note (Signed)
NAME:  Carl Barnes, Carl Barnes                ACCOUNT NO.:  [id number]   MEDICAL RECORD NO.:  [id number]          PATIENT TYPE:  AMB   LOCATION:  ENDO                         FACILITY:  [facility]   PHYSICIAN:  John C. Madilyn Fireman, M.D.    DATE OF BIRTH:  12-29-1950   DATE OF PROCEDURE:  04/10/2009  DATE OF DISCHARGE:                               OPERATIVE REPORT   PROCEDURE:  Endoscopic retrograde cholangiopancreatography with stent  removal and stone extraction.   INDICATIONS FOR PROCEDURE:  Retained common bile duct stones, unable to  remove them all on the previous procedure 2 months ago.   SURGEON:  John C. Madilyn Fireman, M.D.   PROCEDURE:  The patient was placed in the prone position and placed on  the pulse monitor with continuous low-flow oxygen delivered by nasal  cannula.  He was sedated under general anesthesia per anesthesiology.  The Olympus side-viewing endoscope was advanced blindly in the  oropharynx, esophagus, and stomach.  The pylorus was traversed and the  papillae vallatae located on the medial duodenal wall.  There was a  blue,  previously placed 5-cm stent placed and this was removed with  snare.  The sphincterotome was then introduced cholangiogram obtained  showed multiple fragments of stones throughout the common bile duct and  common hepatic duct.  The guidewire was advanced and the 15-mm balloon  catheter was advanced high into the common hepatic duct and dragged  down.  Multiple large crumbly stone fragments were retrieved on several  balloon sweeps.  Finally, on one of the sweeps, a hard black stone  looking distinctly different from the other stones came through the  duct.  This likely represents a single stone that was felt to be  retained on the previous two procedures.  After this was removed 2 or 3  more sweeps delivered only tiny debris and the last two balloon sweeps  revealed no further debris or stones.  An occlusion cholangiogram pinned  to the procedure revealed no  obvious further filling defects.  The scope  was then withdrawn and the patient returned to the recovery room in  stable condition.  He tolerated the procedure well.  There were no  immediate complications.   IMPRESSION:  1. Multiple common bile duct stones removed after stent removed.   PLAN:  Will monitor clinically for evidence of recurrence of any stones.  To duct appears to be clean of significant stones at present.           ______________________________  Everardo All. Madilyn Fireman, M.D.     JCH/MEDQ  D:  04/10/2009  T:  04/10/2009  Job:  [id number]

## 2011-01-05 NOTE — Consult Note (Signed)
NAME:  Carl Barnes, Carl Barnes                ACCOUNT NO.:  [id number]   MEDICAL RECORD NO.:  [id number]          PATIENT TYPE:  INP   LOCATION:                               FACILITY:  [facility]   PHYSICIAN:  John C. Madilyn Fireman, M.D.    DATE OF BIRTH:  June 16, 1951   DATE OF CONSULTATION:  11/01/2008  DATE OF DISCHARGE:                                 CONSULTATION   We were asked to see Carl Barnes today in consultation for retained common  bile duct stone by Dr. Johna Sheriff of the surgery service.   HISTORY OF PRESENT ILLNESS:  This is a 60 year old gentleman who had  right lower quadrant pain and chest tightness for about 5 days before he  came to the ED today, November 01, 2008.  He was found to have cystic duct  obstruction and is now immediately status post cholecystectomy.  During  cholecystectomy, he was found to have a retained distal common bile duct  stone on IOC.   His past medical history is significant for:  1. Nephrolithiasis.  2. Osteoarthritis.  3. Hyperlipidemia.  4. He is status post knee surgery.   CURRENT MEDICATIONS:  Cipro, Protonix, and Phenergan.   He has an allergy to CODEINE.   Social history, family history, and review of systems is unobtainable as  the patient is currently found asleep in PACU.   PHYSICAL EXAMINATION:  GENERAL:  The patient is very drowsy, awakens  momentarily when I shake him and speak to him.  HEART:  Regular rate and rhythm.  LUNGS:  Clear.  ABDOMEN:  Distended and quiet.  VITAL SIGNS:  Pulse 79, blood pressure is 141/90.   LABORATORY DATA:  Hemoglobin of 16.0, hematocrit 45.3, white count 8.6,  platelets 100,000.  LFTs are as follows, AST 47, ALT 69, alk phos 82,  total bilirubin 1.3.  BMETs within normal limits other than a glucose of  165.   CT scan done today shows:  1. Acute cholecystitis with stones.  2. Dilated CBD between 9-10 mm.  3. Right hepatic lobe mass.  4. 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  5. Possible esophagitis.  6.  Diverticulosis.   This gentleman also had an ultrasound today that showed a CBD at 9-10  mm, and he had a positive IOC during surgery.   ASSESSMENT:  Dr. Dorena Cookey has seen and examined the patient, collected  a history, and reviewed his chart.  His impression is, this is a 60-year-  old gentleman with retained common bile duct stones status post  cholecystectomy.   Plan for endoscopic retrograde cholangiopancreatography tomorrow at  11:00 a.m. pending the patient's consent.  We will talk to the family  and the patient when he is more alert.  Thanks very much for this  consultation.      Stephani Police, PA    ______________________________  Everardo All Madilyn Fireman, M.D.    MLY/MEDQ  D:  11/01/2008  T:  11/02/2008  Job:  [id number]   cc:   Lorne Skeens. Hoxworth, M.D.

## 2011-01-05 NOTE — Op Note (Signed)
NAME:  Carl Barnes, Carl Barnes                ACCOUNT NO.:  [id number]   MEDICAL RECORD NO.:  [id number]          PATIENT TYPE:  OIB   LOCATION:  1343                         FACILITY:  [facility]   PHYSICIAN:  John C. Madilyn Fireman, M.D.    DATE OF BIRTH:  November 30, 1950   DATE OF PROCEDURE:  02/13/2009  DATE OF DISCHARGE:                               OPERATIVE REPORT   Endoscopic retrograde cholangiopancreatography with sphincterotomy,  stone extraction and stent placement.   INDICATIONS FOR PROCEDURE:  Common bile duct stone unable to extract all  on previous ERCP 2 months ago.  The patient has had a stent placed and  has been treated with Actigall for 2 months.  The procedure is to  attempt to clear the duct of residual stones.   PROCEDURE:  The patient was placed in the prone position and placed on a  pulse monitor with continuous low-flow oxygen delivered by nasal  cannula.  He was sedated with general anesthesia.  The Olympus side-  viewing endoscope was advanced blindly into the oropharynx, esophagus  and stomach.  For some reason there was extreme difficulty in getting  the scope to advance through the pylorus and approximately 45 minutes  were spent doing this, eventually switching to a larger caliber side-  viewing scope to get this accomplished.  The papilla of Vater was  located on the medial duodenal wall.  The existing stent had migrated  distally and the majority of it was out in the duodenal lumen.  It was  grasped with the snare and removed.  A Wilson-Cook sphincterotome was  then advanced into the duct and a cholangiogram obtained.  This showed  multiple irregular filling defects throughout the common bile duct and  common hepatic duct with intra and extrahepatic dilatation.  The  sphincterotomy was extended significantly and an 18 mm adjustable  balloon was passed in multiple sweeps delivering a large volume of some  sludgy stones and debris.  This took multiple sweeps and finally on the  last cholangiogram there appeared to be one small harder stone,  approximately 3-4 mm that had been persistent despite success in  clearing the duct of other stones and debris.  I was anticipating using  a basket catheter try to extract this, but suddenly there appeared to be  frank hemobilia with blood streaming from the papilla.  This eventually  clotted, but obscured visualization the rest the way and I was hesitant  to perform any more therapeutic maneuvers due to the bleeding.  At this  point, I elected to place another 5 cm, 10-French stent in place of the  old, both to ensure drainage into the cyst and possible hemostasis.  This was accomplished and there was bile that seemed to exit from the  stent and once I  washed off all of the old blood I did not see any  further evidence of active bleeding.  The scope was then withdrawn and  the patient returned to the recovery room in stable condition.  He  tolerated the procedure well and there were no immediate complications.   IMPRESSION:  Multiple common duct stones with probably one remaining.  The therapeutic maneuvers limited by hemobilia which seemed to stop by  the time of the procedure.   PLAN/:  We will monitor liver function tests and follow and probably  continued Actigall while performing MRCP or ERCP at some point in the  future.  Since he has had difficulty with sedation in the past we may  perform an MRCP and if negative, simply remove the stent without an ERCP  and do it under conscious sedation rather general anesthesia which he  has required past.           ______________________________  Everardo All. Madilyn Fireman, M.D.     JCH/MEDQ  D:  02/13/2009  T:  02/13/2009  Job:  [id number]

## 2011-01-05 NOTE — Op Note (Signed)
NAME:  [name], Carl Barnes                ACCOUNT NO.:  [id number]   MEDICAL RECORD NO.:  [id number]          PATIENT TYPE:  OBV   LOCATION:  5120                         FACILITY:  [facility]   PHYSICIAN:  John C. Madilyn Fireman, M.D.    DATE OF BIRTH:  February 08, 1951   DATE OF PROCEDURE:  11/02/2008  DATE OF DISCHARGE:                               OPERATIVE REPORT   PROCEDURE:  Endoscopic retrograde cholangiopancreatography with  sphincterotomy.   INDICATION FOR PROCEDURE:  Common bile duct stones seen on  intraoperative cholangiogram at the time of cholecystectomy.   PROCEDURE:  The patient was placed in the left lateral decubitus  position and placed on the pulse monitor with continuous low-flow oxygen  delivered by nasal cannula.  He was sedated with 150 mcg IV fentanyl, 14  mg IV Versed, and 25 mg IV Benadryl.  The Olympus video side-viewing  endoscope was advanced blindly into the oropharynx, esophagus, and  stomach.  The pylorus was traversed and papilla of Vater located on the  medial duodenal wall.  It had somewhat normal appearance and bile was  seen to exit from it.  It was cannulated with Wilson-Cook  sphincterotome.  Initial passage of the wire and dye entered the  pancreatic duct.  With repositioning, the common bile duct was  selectively cannulated and showed a tapered, dilated common hepatic duct  with a single 5-6 mm mobile filling defect.  The guidewire was passed  high into the common hepatic duct and a large sphincterotomy was  performed.  Using an adjustable 12-15 mm balloon catheter, I made  attempts to remove the stone, but would not come through the papilla.  I  then enlarged the sphincterotomy and continued balloon extraction  attempts, but they were unsuccessful.  The patient became increasingly  combative and difficult to keep still during the procedure.  We tried to  advance a  ERCP stone basket over the guidewire, but it would not  advance very far and the patient began to  became more combative.  I  decided, it would probably be easier to place temporary stent and come  back to do a repeat study later.  Therefore, the basket was removed and  we made preparations to advance the stent.  However, the patient became  increasingly combative and at the time that we were about to pass the  stent with the guide catheter in the duct, the patient came off the  table with his head turned sharply to the left, we could no longer  control him and at that point the procedure had to be aborted.  The  scope was then withdrawn and the patient returned to the recovery room  in stable condition.  He did not tolerate the procedure well in the  latter part.  There were no immediate complications.   IMPRESSION:  Common duct stone, status post large sphincterotomy with  unsuccessful attempts of removing stone.   PLAN:  We will observe overnight on antibiotics.  Check liver function  tests tomorrow and probably repeat ERCP tomorrow possibly with general  anesthesia assisting.  ______________________________  Everardo All Madilyn Fireman, M.D.     JCH/MEDQ  D:  11/02/2008  T:  11/03/2008  Job:  [id number]

## 2011-01-08 NOTE — Discharge Summary (Signed)
NAMEMarland Barnes  KRATOS, RUSCITTI                ACCOUNT NO.:  [id number]   MEDICAL RECORD NO.:  [id number]          PATIENT TYPE:  INP   LOCATION:  2035                         FACILITY:  [facility]   PHYSICIAN:  Learta Codding, M.D. LHCDATE OF BIRTH:  [birth year]   DATE OF ADMISSION:  06/07/2004  DATE OF DISCHARGE:  06/09/2004                                 DISCHARGE SUMMARY   PROCEDURES:  1.  Adenosine Cardiolite.  2.  CT of the chest.   HOSPITAL COURSE:  [name] is a 60 year old male with no known history of  coronary artery disease.  He had significant substernal chest pain on the  day of admission that lasted about six hours.  He had some improvement with  pain medications, but no improvement with nitroglycerin.  He was admitted  for further evaluation and treatment.   His enzymes were negative for MI.  He continued to complain of some chest  burning at a 3/10.  A Cardiolite was performed which showed no scar or  ischemia and an EF of 66%.  A D-dimer had been ordered and was slightly  elevated at 0.66 so a chest CT was performed as well.  The chest CT showed  no pulmonary embolus.  There was a scar/atelectasis in the right perihilar  lung with mildly prominent right hilar lymph nodes and he will need a follow-  up CT in three months.   The patient stated that the pain which had initially been in the substernal  area was now mainly focused in the right upper quadrant.  He had an amylase  and a lipase and LFTs performed.  The amylase and lipase were within normal  limits.  His total bilirubin was 1.7 with an indirect bilirubin of 1.3 and a  direct bilirubin of 0.4.  Alkaline phosphatase was normal at 71 but SGOT and  SGPT were mildly elevated at 54 and 75, respectively.  Albumin was 5.9,  total protein 3.3.  His abdomen is not tender and he has active bowel  sounds.   [name] also ran a fever overnight with a temperature that reached 101.6.  However, there was no infiltrate on the chest CT  and the urinalysis was  negative and the fever resolved to 98.4 the next morning.   As part of his evaluation he had a lipid profile performed which showed an  LDL of 114 and an HDL of 35, other values within normal limits.  Diet  changes are recommended.  Additionally, he was hyperglycemic with a blood  sugar one that was 155 and one that was fasting at 124.  A hemoglobin A1C  was within normal limits at 5.5 but the patient was advised to avoid  processed sugars and follow up with Dr. Hetty Ely.   By June 09, 2004 [name] was ambulating without chest pain or shortness  of breath.  He is tentatively considered stable for discharge on June 09, 2004 and is to follow up with Dr. Hetty Ely and with Dr. Andee Lineman on a p.r.n.  basis.   DISCHARGE DIAGNOSES:  1.  Chest pain.  No significant abnormality by Cardiolite or by chest CT R/O      esophagitis vs early pericarditis.  2.  Ongoing tobacco use.  3.  Mild obesity.  4.  History of sinusitis.  5.  Mild hyperlipidemia.  6.  Allergy or intolerance to codeine.  7.  Hyperglycemia with a normal hemoglobin A1C.  8.  History of nephrolithiasis.  9.  History of left knee surgery.  10. Status post black widow spider bite.  11. Osteoarthritis.   DISCHARGE INSTRUCTIONS:  His activity level is to be as tolerated.  He is to  stick to a low fat and sugar diet.  He is to follow up with Dr. Andee Lineman on a  p.r.n. basis and he is to see Dr. Hetty Ely within about a week.   DISCHARGE MEDICATIONS:  1.  Aspirin 81 mg daily.  2.  Protonix 40 mg daily.      Rhon   RB/MEDQ  D:  06/09/2004  T:  06/09/2004  Job:  [id number]   cc:   Laurita Quint, M.D.  [address] [place]  [place] [address]  Fax: [phone]

## 2011-01-08 NOTE — H&P (Signed)
NAME:  NAVEEN, CLARDY NO.:  [id number]   MEDICAL RECORD NO.:  [id number]          PATIENT TYPE:  EMS   LOCATION:  MAJO                         FACILITY:  [facility]   PHYSICIAN:  Verne Grain, MD   DATE OF BIRTH:  1951-06-01   DATE OF ADMISSION:  06/07/2004  DATE OF DISCHARGE:                                HISTORY & PHYSICAL   Primary cardiologist:  None.  Primary care physician:  Laurita Quint, M.D. [facility]).   CHIEF COMPLAINT:  Chest pain.   HISTORY OF PRESENT ILLNESS:  A 60 year old male ongoing smoker (no  hypertension, no hyperlipidemia, no diabetes, no family history of coronary  artery disease), mildly overweight, reports walking extensively at work and  playing with grandkids.  Denies shortness of breath, denies chest pain at  baseline.  Was feeling great until this morning.  Went to the park with  grandkids and had a hot dog and hamburger, after which the patient had the  onset of epigastric discomfort that moved up into the chest.  The patient  describes the discomfort that moved into the chest as feeling like  pressure/heaviness that was present for six hours and was unaffected by  attempts at nitroglycerin therapy, including nitroglycerin drip.  There was  some improvement with morphine and eventual resolution of discomfort with  Dilaudid.  The patient reports after six hours the pain has for the most  part resolved, but there is some residual discomfort in the pit of  stomach.  The patient has two sets of cardiac markers are negative but,  interestingly, has an EKG that shows evidence of a right bundle branch block  with right axis deviation and nondiagnostic ST-T wave abnormalities.  The  duration of these abnormalities is unclear as the patient has no previous  EKG for comparison.  The patient again reports no shortness of breath with  the chest pain or afterwards.  He reports that his back is somewhat sore  after lying in the  ER stretcher for several hours, and he is otherwise  without complaint currently.  He did have some mild nausea and diaphoresis  with the chest discomfort but denied any shortness of breath or respiratory  complaint at any time.   ALLERGIES/ADVERSE REACTIONS:  CODEINE causes nausea.   MEDICATIONS:  Aspirin 81 mg p.o. daily.   PAST MEDICAL HISTORY:  1.  Ongoing tobacco use (11 cigarettes per day times 20 years).  2.  Mild to moderately overweight.  3.  History of sinusitis.  4.  No hypertension, no hyperlipidemia, no diabetes, no family history of      coronary artery disease, no cardiac history, no history of cardiac      catheterization, stress test, or other cardiac evaluation.   SOCIAL HISTORY:  The patient lives in [place] with his wife of nine  years.  He works for the [employer].  He has ongoing tobacco use at  11 cigarettes per day x20 years (per patient).  He reports consuming liquor,  approximately one fifth every two weeks.  He reports no drug use,  specifically denies heroin, crack cocaine, or other illicit drugs.   FAMILY HISTORY:  The patient's mother is alive at age 69 with some  respiratory problems and history of pneumonia, and the patient's father is  alive at age 38 with no known health problems.  The patient has two  brothers, an older brother who is age 27 who is on disability with some sort  of encephalitis/TIA syndrome, and another brother who is also on disability  for a back injury at age 51, neither of which have any cardiac problems.   REVIEW OF SYSTEMS:  The patient reports some mild diaphoresis with the onset  of his chest pain but otherwise denies any fevers, chills.  Reports no acute  auditory or visual changes.  He reports no new rashes or lesions.  Denies  any shortness of breath, dyspnea on exertion, orthopnea, PND, edema,  palpitations, presyncope, syncope, cough, or wheezing.  He does occasionally  have somewhat of a cough, but this is  not unchanged from his baseline and is  for the most part nonproductive.  The patient has no bowel or bladder  complaints.  His neuropsychiatric status is stable.  He did have mild nausea  with the onset of his chest pain but denies any vomiting or diarrhea.  The  patient reports no heat or cold intolerance.  Reports no acute hair changes  to suggest endocrinologic abnormality.  All other systems are negative.   PHYSICAL EXAMINATION:  VITAL SIGNS:  Temperature 97, heart rate 56-64,  respiratory rate 16-24, blood pressure 102/59 to 139/98, oxygen saturation  100% on 2 L nasal cannula.  GENERAL:  The patient is alert, answers questions appropriately.  He is  pleasant and cooperative.  HEENT:  He is normocephalic, atraumatic.  Extraocular eye movements are  intact.  Oropharynx is pink and moist without lesions.  NECK:  Supple.  There are no bruits.  There is no jugular venous distention.  CARDIOVASCULAR:  Regular S1 and a regular S2.  There is no murmur.  CHEST:  Lung fields are occasionally clear bilaterally.  There is an  occasional scattered rhonchi that appears to clear with cough.  SKIN:  No acute rash or other lesion.  ABDOMEN:  Obese, soft, nontender, nondistended, positive bowel sounds.  EXTREMITIES:  Femoral examination reveals 2+ femoral pulse with no evidence  of femoral bruits.  There is no edema in the lower extremities.  Distal  pulses are 2+ and symmetric in all four extremities.  NEUROLOGIC:  Brief neurologic exam is grossly nonfocal as the patient is  able to move all four extremities without difficulty.  The patient is alert  and oriented x3.   Chest x-ray:  Mild bibasilar interstitial edema/infiltrates, and  borderline cardiomegaly, without priors for comparison.  EKG:  Sinus  rhythm with a rate of 40, right bundle branch block with right axis  deviation, ST-T wave abnormalities, and poor R-wave progression are also  noted.  LABORATORY DATA:  White blood cell count  9.5, hematocrit 44, platelet count  152.  Sodium 134, potassium 3.7, chloride 107, bicarb 22, BUN 12, creatinine  0.9, glucose 155.  CK-MB 2.0, 2.6, troponin I less than 0.05 x2, myoglobin  73/83.  Calcium 8.8.   ASSESSMENT AND PLAN:  A 60 year old male with ongoing tobacco (no  hypertension, no hyperlipidemia, no diabetes, no family history of coronary  artery disease) with epigastric discomfort that became chest discomfort  following consumption of hot dog and hamburger earlier this morning with  discomfort lasting for six hours without relief from nitrates and findings  of cardiac markers that have been negative x2 so far.  Electrocardiogram  with right bundle branch block and right axis deviation.  No shortness of  breath.  Blood glucose 155 on Chem-7.  No prior laboratory values or  electrocardiograms for comparison.  Chest x-ray report mild bibasilar  interstitial edema/infiltrates with borderline cardiomegaly with no previous  chest x-rays for comparison.   1.  Chest discomfort.  The patient's only risk factors are      gender/age/tobacco.  He has some features of his pain that sound      typical, but preceding epigastric discomfort associated with food and      pain refractory to nitroglycerin drip for six hours with two sets of      negative cardiac markers argue somewhat for a noncardiac/perhaps      gastrointestinal source of discomfort.  We will complete rule out      myocardial infarction with serial cardiac markers, EKG, telemetry.  It      is interesting that the patient has a right axis deviation with right      bundle branch block that could possibly be related to underlying lung      disease from ongoing tobacco use or other etiology; however, the patient      currently reports no acute changes in his respiratory status.  He does      not report sudden onset of symptoms.  He has no pleuritic chest pain.      He had had no lengthy trips.  He does not endorse other  symptoms that      could be attributable to pulmonary embolism.  His oxygen saturation is      100% on 2 L nasal cannula in the ER.  There is no evidence of tachypnea.      Overall I believe that there is a low suspicion for pulmonary embolism      in this patient.  We can check a D-dimer with next set of cardiac      markers to further support this assertion.  Will also check an amylase,      lipase, and LFTs with next set of cardiac markers to again evaluate for      possible gastrointestinal source of discomfort or atypical presentation      of a gastrointestinal source of chest discomfort.  Will keep the patient      NPO in anticipation of a treadmill stress echocardiogram, which will      allow risk stratification from an ischemic heart disease standpoint as      well as an evaluation of the patient's myocardial structure and function      to further investigate the finding of right bundle branch block/right      axis deviation as previously described. 2.  Tobacco.  We will continue to encourage the importance of tobacco      cessation.  3.  Obesity with blood glucose of 155 on Chem-7 (no priors for comparison).      We will check a hemoglobin A1C with next set of laboratory values to      rule out occult hyperglycemia.  Continue to encourage weight loss and      activity following risk stratification with treadmill stress echo as      described above.  4.  Prevention.  Will also check a lipid profile with morning labs, again to  exclude any occult hyperlipidemia that the patient is unaware of.       DDH/MEDQ  D:  06/08/2004  T:  06/08/2004  Job:  [id number]   cc:   Laurita Quint, M.D.  [address] [place]  [place] [address]  Fax: [phone]

## 2011-01-11 ENCOUNTER — Ambulatory Visit (HOSPITAL_COMMUNITY)
Admission: RE | Admit: 2011-01-11 | Discharge: 2011-01-11 | Disposition: A | Discharge status: Home / Self Care | Payer: 59 | Source: Ambulatory Visit | Attending: Specialist | Admitting: Specialist

## 2011-01-11 ENCOUNTER — Other Ambulatory Visit (HOSPITAL_COMMUNITY): Payer: Self-pay | Admitting: Specialist

## 2011-01-11 DIAGNOSIS — Z01818 Encounter for other preprocedural examination: Secondary | ICD-10-CM | POA: Insufficient documentation

## 2011-01-11 DIAGNOSIS — Z77018 Contact with and (suspected) exposure to other hazardous metals: Secondary | ICD-10-CM

## 2011-01-22 HISTORY — PX: CHOLECYSTECTOMY: SHX55

## 2011-05-10 ENCOUNTER — Ambulatory Visit: Payer: Self-pay

## 2011-05-10 ENCOUNTER — Other Ambulatory Visit: Payer: Self-pay | Admitting: Occupational Medicine

## 2011-05-10 DIAGNOSIS — R52 Pain, unspecified: Secondary | ICD-10-CM

## 2012-02-03 ENCOUNTER — Encounter: Payer: Self-pay | Admitting: Family Medicine

## 2012-02-07 ENCOUNTER — Encounter: Payer: Self-pay | Admitting: Family Medicine

## 2012-02-07 ENCOUNTER — Ambulatory Visit (INDEPENDENT_AMBULATORY_CARE_PROVIDER_SITE_OTHER): Payer: 59 | Admitting: Family Medicine

## 2012-02-07 ENCOUNTER — Encounter: Payer: Self-pay | Admitting: Internal Medicine

## 2012-02-07 VITALS — BP 102/58 | HR 68 | Temp 98.2°F | Ht 68.75 in | Wt 224.0 lb

## 2012-02-07 DIAGNOSIS — E785 Hyperlipidemia, unspecified: Secondary | ICD-10-CM

## 2012-02-07 DIAGNOSIS — Z0000 Encounter for general adult medical examination without abnormal findings: Secondary | ICD-10-CM

## 2012-02-07 DIAGNOSIS — F17200 Nicotine dependence, unspecified, uncomplicated: Secondary | ICD-10-CM

## 2012-02-07 DIAGNOSIS — Z131 Encounter for screening for diabetes mellitus: Secondary | ICD-10-CM

## 2012-02-07 DIAGNOSIS — E7800 Pure hypercholesterolemia, unspecified: Secondary | ICD-10-CM

## 2012-02-07 DIAGNOSIS — Z1211 Encounter for screening for malignant neoplasm of colon: Secondary | ICD-10-CM

## 2012-02-07 NOTE — Progress Notes (Signed)
CPE- See plan.  Routine anticipatory guidance given to patient.  See health maintenance. Tetanus shot done 2012 in hospitalization.   He'll check on zostavax injection, he may have had prev.  PNA shot done in 2012 in hospitalization.   Flu shot done yearly.   D/w patient [id number] for colon cancer screening, including IFOB vs. colonoscopy.  Risks and benefits of both were discussed and patient voiced understanding.  Pt elects for: colonoscopy.  Prostate cancer screening.  PSA options were discussed along with recent recs.  No clear indication for psa at this point, since patient is low risk and there is no FH of prosate CA.  He declined testing of PSA.  We can consider rechecking next year.    Planning on retiring soon from [employer].    PMH and SH reviewed  Meds, vitals, and allergies reviewed.   ROS: See HPI.  Otherwise negative.    GEN: nad, alert and oriented HEENT: mucous membranes moist NECK: supple w/o LA CV: rrr. PULM: ctab, no inc wob ABD: soft, +bs EXT: no edema SKIN: no acute rash Prostate gland firm and smooth, no enlargement, nodularity, tenderness, mass, asymmetry or induration.

## 2012-02-07 NOTE — Patient Instructions (Addendum)
See Shirlee Limerick about your referral before you leave today. Check to see if you have had the shingles shot.  If you haven't had the shot, check with your insurance to see if they will cover it.   Come back for fasting labs.  Set up the lab visit on the way out.   Take care.  Let me know if I can help you quit smoking.

## 2012-02-08 DIAGNOSIS — Z0000 Encounter for general adult medical examination without abnormal findings: Secondary | ICD-10-CM | POA: Insufficient documentation

## 2012-02-08 NOTE — Assessment & Plan Note (Signed)
D/w pt about cessation.  

## 2012-02-08 NOTE — Assessment & Plan Note (Signed)
H/o, return for labs.  D/w pt about diet,exercise, weight.

## 2012-02-14 ENCOUNTER — Other Ambulatory Visit: Payer: Self-pay

## 2012-02-18 ENCOUNTER — Encounter: Payer: Self-pay | Admitting: *Deleted

## 2012-02-18 ENCOUNTER — Other Ambulatory Visit (INDEPENDENT_AMBULATORY_CARE_PROVIDER_SITE_OTHER): Payer: 59

## 2012-02-18 DIAGNOSIS — Z131 Encounter for screening for diabetes mellitus: Secondary | ICD-10-CM

## 2012-02-18 DIAGNOSIS — E7800 Pure hypercholesterolemia, unspecified: Secondary | ICD-10-CM

## 2012-02-18 LAB — GLUCOSE, RANDOM: Glucose, Bld: 99 mg/dL (ref 70–99)

## 2012-02-18 LAB — LIPID PANEL
LDL Cholesterol: 106 mg/dL — ABNORMAL HIGH (ref 0–99)
Total CHOL/HDL Ratio: 5
Triglycerides: 93 mg/dL (ref 0.0–149.0)

## 2012-03-28 ENCOUNTER — Ambulatory Visit (AMBULATORY_SURGERY_CENTER): Payer: 59

## 2012-03-28 VITALS — Ht 69.0 in | Wt 222.0 lb

## 2012-03-28 DIAGNOSIS — Z1211 Encounter for screening for malignant neoplasm of colon: Secondary | ICD-10-CM

## 2012-03-28 MED ORDER — MOVIPREP 100 G PO SOLR: 1.0000 | Freq: Once | ORAL | Status: DC

## 2012-04-11 ENCOUNTER — Ambulatory Visit (AMBULATORY_SURGERY_CENTER): Payer: 59 | Admitting: Internal Medicine

## 2012-04-11 ENCOUNTER — Encounter: Payer: Self-pay | Admitting: Internal Medicine

## 2012-04-11 VITALS — BP 111/61 | HR 61 | Temp 97.9°F | Resp 24 | Ht 69.0 in | Wt 222.0 lb

## 2012-04-11 DIAGNOSIS — Z1211 Encounter for screening for malignant neoplasm of colon: Secondary | ICD-10-CM

## 2012-04-11 DIAGNOSIS — K648 Other hemorrhoids: Secondary | ICD-10-CM

## 2012-04-11 DIAGNOSIS — D126 Benign neoplasm of colon, unspecified: Secondary | ICD-10-CM

## 2012-04-11 DIAGNOSIS — K5730 Diverticulosis of large intestine without perforation or abscess without bleeding: Secondary | ICD-10-CM

## 2012-04-11 MED ORDER — SODIUM CHLORIDE 0.9 % IV SOLN: 500.0000 mL | INTRAVENOUS | Status: DC

## 2012-04-11 NOTE — Patient Instructions (Addendum)
Two very small polyps were removed - I will let you know the results. You also have diverticulosis and small hemorrhoids.  Please read the handouts provided.  Thank you for choosing me and  [facility].  Iva Boop, MD, FACG YOU HAD AN ENDOSCOPIC PROCEDURE TODAY AT THE [facility]: Refer to the procedure report that was given to you for any specific questions about what was found during the examination.  If the procedure report does not answer your questions, please call your gastroenterologist to clarify.  If you requested that your care partner not be given the details of your procedure findings, then the procedure report has been included in a sealed envelope for you to review at your convenience later.  YOU SHOULD EXPECT: Some feelings of bloating in the abdomen. Passage of more gas than usual.  Walking can help get rid of the air that was put into your GI tract during the procedure and reduce the bloating. If you had a lower endoscopy (such as a colonoscopy or flexible sigmoidoscopy) you may notice spotting of blood in your stool or on the toilet paper. If you underwent a bowel prep for your procedure, then you may not have a normal bowel movement for a few days.  DIET: Your first meal following the procedure should be a light meal and then it is ok to progress to your normal diet.  A half-sandwich or bowl of soup is an example of a good first meal.  Heavy or fried foods are harder to digest and may make you feel nauseous or bloated.  Likewise meals heavy in dairy and vegetables can cause extra gas to form and this can also increase the bloating.  Drink plenty of fluids but you should avoid alcoholic beverages for 24 hours.  ACTIVITY: Your care partner should take you home directly after the procedure.  You should plan to take it easy, moving slowly for the rest of the day.  You can resume normal activity the day after the procedure however you should NOT DRIVE or use  heavy machinery for 24 hours (because of the sedation medicines used during the test).    SYMPTOMS TO REPORT IMMEDIATELY: A gastroenterologist can be reached at any hour.  During normal business hours, 8:30 AM to 5:00 PM Monday through Friday, call [phone].  After hours and on weekends, please call the GI answering service at [phone] who will take a message and have the physician on call contact you.   Following lower endoscopy (colonoscopy or flexible sigmoidoscopy):  Excessive amounts of blood in the stool  Significant tenderness or worsening of abdominal pains  Swelling of the abdomen that is new, acute  Fever of 100F or higher  Following upper endoscopy (EGD)  Vomiting of blood or coffee ground material  New chest pain or pain under the shoulder blades  Painful or persistently difficult swallowing  New shortness of breath  Fever of 100F or higher  Black, tarry-looking stools  FOLLOW UP: If any biopsies were taken you will be contacted by phone or by letter within the next 1-3 weeks.  Call your gastroenterologist if you have not heard about the biopsies in 3 weeks.  Our staff will call the home number listed on your records the next business day following your procedure to check on you and address any questions or concerns that you may have at that time regarding the information given to you following your procedure. This is a courtesy call and  so if there is no answer at the home number and we have not heard from you through the emergency physician on call, we will assume that you have returned to your regular daily activities without incident.  SIGNATURES/CONFIDENTIALITY: You and/or your care partner have signed paperwork which will be entered into your electronic medical record.  These signatures attest to the fact that that the information above on your After Visit Summary has been reviewed and is understood.  Full responsibility of the confidentiality of this  discharge information lies with you and/or your care-partner.

## 2012-04-11 NOTE — Progress Notes (Signed)
Patient did not experience any of the following events: a burn prior to discharge; a fall within the facility; wrong site/side/patient/procedure/implant event; or a hospital transfer or hospital admission upon discharge from the facility. (G8907) Patient did not have preoperative order for IV antibiotic SSI prophylaxis. (G8918)  

## 2012-04-11 NOTE — Op Note (Signed)
[facility] [address] [place], [address]   COLONOSCOPY PROCEDURE REPORT  PATIENT: Carl Barnes, Carl Barnes  MR#: [id number] BIRTHDATE: Dec 11, 1950 , 61  yrs. old GENDER: Male ENDOSCOPIST: Iva Boop, MD, [facility] REFERRED [id number] Para March, M.D. PROCEDURE DATE:  04/11/2012 PROCEDURE:   Colonoscopy with biopsy ASA CLASS:   Class II INDICATIONS:average risk screening. MEDICATIONS: propofol (Diprivan) 230 mg IV, MAC sedation, administered by CRNA, and These medications were titrated to patient response per physician's verbal order  DESCRIPTION OF PROCEDURE:   After the risks benefits and alternatives of the procedure were thoroughly explained, informed consent was obtained.  A digital rectal exam revealed the prostate was not enlarged.   The LB CF-[id number]  endoscope was introduced through the anus and advanced to the cecum, which was identified by both the appendix and ileocecal valve. No adverse events experienced.   The quality of the prep was excellent, using MoviPrep  The instrument was then slowly withdrawn as the colon was fully examined.      COLON FINDINGS: Two diminutive sessile polyps were found at the cecum and in the descending colon.  A polypectomy was performed with cold forceps.  The resection was complete and the polyp tissue was completely retrieved.   There was severe diverticulosis noted in the sigmoid colon with associated colonic narrowing.   Mild diverticulosis was noted throughout the entire examined colon. The colon mucosa was otherwise normal.   Right colon retroflexion was performed. Retroflexed views revealed internal hemorrhoid.  The time to cecum = 1:09 minutes  Withdrawal time= 11:39 minutes and the procedure completed. COMPLICATIONS: There were no complications. ENDOSCOPIC IMPRESSION: 1.   Two diminutive sessile polyps were found at the cecum and in the descending colon; polypectomy was performed with cold forceps 2.   There  was severe diverticulosis noted in the sigmoid colon 3.   Mild diverticulosis was noted throughout the remainder of the examined colon 4.   The colon mucosa was otherwise normal with excellent prep  RECOMMENDATIONS: Await pathology results  eSigned:  Iva Boop, MD, [facility] 04/11/2012 9:48 AM   [id number] Para March, MD The Patient   PATIENT NAME:  [name], [name] MR#: [id number]

## 2012-04-12 ENCOUNTER — Telehealth: Payer: Self-pay | Admitting: *Deleted

## 2012-04-12 NOTE — Telephone Encounter (Signed)
No answer, message left for the patient. 

## 2012-04-14 ENCOUNTER — Encounter: Payer: Self-pay | Admitting: Family Medicine

## 2012-04-14 DIAGNOSIS — K5790 Diverticulosis of intestine, part unspecified, without perforation or abscess without bleeding: Secondary | ICD-10-CM | POA: Insufficient documentation

## 2012-04-20 ENCOUNTER — Encounter: Payer: Self-pay | Admitting: Internal Medicine

## 2012-04-20 NOTE — Progress Notes (Signed)
Quick Note:  Not polyps - routine repeat colonoscopy in 10 years ______

## 2012-09-03 ENCOUNTER — Encounter: Payer: Self-pay | Admitting: Family Medicine

## 2012-09-03 DIAGNOSIS — K8050 Calculus of bile duct without cholangitis or cholecystitis without obstruction: Secondary | ICD-10-CM | POA: Insufficient documentation

## 2015-10-22 ENCOUNTER — Telehealth: Payer: Self-pay | Admitting: Family Medicine

## 2015-10-22 NOTE — Telephone Encounter (Signed)
Pt has appt with Dr Damita Dunnings on 10/24/15 at 10:30.

## 2015-10-22 NOTE — Telephone Encounter (Signed)
[facility]  Patient Name: Carl Barnes  DOB: 05-Aug-1951    Initial Comment caller states husband got choked last night, was coughing and fainted   Nurse Assessment  Nurse: Wayne Sever, RN, Tillie Rung Date/Time (Eastern Time): 10/22/2015 2:45:33 PM  Confirm and document reason for call. If symptomatic, describe symptoms. You must click the next button to save text entered. ---Wife states he was coughing last night and passed out. He gets choked frequently when eating per wife. Wife states ice went down the wrong way last night and then he turned purple and passed out. Wife states he was fine after that. She states he does this sometimes and has been been evaluated for it.  Has the patient traveled out of the country within the last 30 days? ---Not Applicable  Does the patient have any new or worsening symptoms? ---Yes  Will a triage be completed? ---Yes  Related visit to physician within the last 2 weeks? ---No  Does the PT have any chronic conditions? (i.e. diabetes, asthma, etc.) ---No  Is this a behavioral health or substance abuse call? ---No     Guidelines    Guideline Title Affirmed Question Affirmed Notes  Fainting Simple fainting is a chronic symptom (has occurred multiple times)    Final Disposition User   See PCP within [place], RN, Tillie Rung    Comments  Scheduled caller for appointment on 03/03 at 1030am. He really did not want to come see MD anyway despite this happening multiple times. Encouraged wife to talk him into coming and they agreed to this appointment time and date   Referrals  REFERRED TO PCP OFFICE   Disagree/Comply: Comply

## 2015-10-23 NOTE — Telephone Encounter (Signed)
Will see at OV 

## 2015-10-24 ENCOUNTER — Other Ambulatory Visit (HOSPITAL_COMMUNITY): Payer: Self-pay | Admitting: Family Medicine

## 2015-10-24 ENCOUNTER — Encounter: Payer: Self-pay | Admitting: Family Medicine

## 2015-10-24 ENCOUNTER — Ambulatory Visit (INDEPENDENT_AMBULATORY_CARE_PROVIDER_SITE_OTHER): Payer: Medicare Other | Admitting: Family Medicine

## 2015-10-24 VITALS — BP 136/80 | HR 62 | Temp 97.7°F | Wt 229.2 lb

## 2015-10-24 DIAGNOSIS — T17320A Food in larynx causing asphyxiation, initial encounter: Secondary | ICD-10-CM | POA: Diagnosis not present

## 2015-10-24 DIAGNOSIS — R1310 Dysphagia, unspecified: Secondary | ICD-10-CM

## 2015-10-24 NOTE — Progress Notes (Signed)
Pre visit review using our clinic review tool, if applicable. No additional management support is needed unless otherwise documented below in the visit note.  Has happened mult times.  Most recently was eating supper.  Drank some tea and misswallowed some ice.  Started coughing.  Then woke up on the floor.  Was "out" for a few seconds.  Witnessed, turned purple.  First episode was a few years ago.    He was between the stove and the countertop and wife tried to do the heimlich (she wasn't hearing breathing) but the positioning was difficult.  Then he came to.    Per family, he'll often get "strangled" with eating quickly.  He has frequent indigestion.  Burping frequency. Not vomiting.  Occ regurgitation qhs.  Usually doesn't have feeling of food getting stuck with swallowing.    ROS: See HPI, otherwise noncontributory.  Meds, vitals, and allergies reviewed.   GEN: nad, alert and oriented HEENT: mucous membranes moist, OP wnl NECK: supple w/o LA, no stridor CV: rrr PULM: ctab, no inc wob ABD: soft, +bs

## 2015-10-24 NOTE — Patient Instructions (Signed)
Chew well, don't rush to eat.  Don't lay down right after eating.  Carl Barnes will call about your referral. Take care.  Glad to see you.

## 2015-10-24 NOTE — Assessment & Plan Note (Signed)
No sx now, has had recurrent episodes.  Aspiration precautions and heimlich maneuver's d/w pt.   See AVS.   Will get barium swallow done then update patient.  He does have sig indigestion and if MBS is wnl then we may still need to get GI input.   >20 minutes spent in face to face time with patient, >50% spent in counselling or coordination of care

## 2015-10-30 ENCOUNTER — Ambulatory Visit (HOSPITAL_COMMUNITY)
Admission: RE | Admit: 2015-10-30 | Discharge: 2015-10-30 | Disposition: A | Discharge status: Home / Self Care | Payer: Medicare Other | Source: Ambulatory Visit | Attending: Family Medicine | Admitting: Family Medicine

## 2015-10-30 DIAGNOSIS — T17320A Food in larynx causing asphyxiation, initial encounter: Secondary | ICD-10-CM

## 2015-10-30 DIAGNOSIS — X58XXXA Exposure to other specified factors, initial encounter: Secondary | ICD-10-CM | POA: Insufficient documentation

## 2015-10-30 DIAGNOSIS — R1310 Dysphagia, unspecified: Secondary | ICD-10-CM

## 2016-05-09 ENCOUNTER — Other Ambulatory Visit: Payer: Self-pay | Admitting: Family Medicine

## 2016-05-09 DIAGNOSIS — Z125 Encounter for screening for malignant neoplasm of prostate: Secondary | ICD-10-CM

## 2016-05-09 DIAGNOSIS — E786 Lipoprotein deficiency: Secondary | ICD-10-CM

## 2016-05-11 ENCOUNTER — Other Ambulatory Visit (INDEPENDENT_AMBULATORY_CARE_PROVIDER_SITE_OTHER): Payer: Medicare Other

## 2016-05-11 DIAGNOSIS — E786 Lipoprotein deficiency: Secondary | ICD-10-CM | POA: Diagnosis not present

## 2016-05-11 DIAGNOSIS — Z125 Encounter for screening for malignant neoplasm of prostate: Secondary | ICD-10-CM | POA: Diagnosis not present

## 2016-05-11 LAB — LIPID PANEL
CHOL/HDL RATIO: 6
CHOLESTEROL: 147 mg/dL (ref 0–200)
HDL: 25.3 mg/dL — ABNORMAL LOW (ref >39.00)
NONHDL: 121.56
TRIGLYCERIDES: 207 mg/dL — AB (ref 0.0–149.0)
VLDL: 41.4 mg/dL — AB (ref 0.0–40.0)

## 2016-05-11 LAB — COMPREHENSIVE METABOLIC PANEL
ALBUMIN: 3.8 g/dL (ref 3.5–5.2)
ALT: 20 U/L (ref 0–53)
AST: 14 U/L (ref 0–37)
Alkaline Phosphatase: 63 U/L (ref 39–117)
BUN: 16 mg/dL (ref 6–23)
CALCIUM: 9 mg/dL (ref 8.4–10.5)
CO2: 30 mEq/L (ref 19–32)
CREATININE: 1.08 mg/dL (ref 0.40–1.50)
Chloride: 107 mEq/L (ref 96–112)
GFR: 72.68 mL/min (ref >60.00)
Glucose, Bld: 186 mg/dL — ABNORMAL HIGH (ref 70–99)
Potassium: 4.8 mEq/L (ref 3.5–5.1)
Sodium: 141 mEq/L (ref 135–145)
Total Bilirubin: 0.6 mg/dL (ref 0.2–1.2)
Total Protein: 6.4 g/dL (ref 6.0–8.3)

## 2016-05-11 LAB — LDL CHOLESTEROL, DIRECT: Direct LDL: 93 mg/dL

## 2016-05-11 LAB — PSA: PSA: 0.32 ng/mL (ref 0.10–4.00)

## 2016-05-18 ENCOUNTER — Encounter: Payer: Self-pay | Admitting: Family Medicine

## 2016-05-18 ENCOUNTER — Ambulatory Visit (INDEPENDENT_AMBULATORY_CARE_PROVIDER_SITE_OTHER): Payer: Medicare Other | Admitting: Family Medicine

## 2016-05-18 VITALS — BP 118/70 | HR 64 | Temp 97.3°F | Wt 226.0 lb

## 2016-05-18 DIAGNOSIS — R739 Hyperglycemia, unspecified: Secondary | ICD-10-CM | POA: Diagnosis not present

## 2016-05-18 DIAGNOSIS — R7309 Other abnormal glucose: Secondary | ICD-10-CM

## 2016-05-18 DIAGNOSIS — F17200 Nicotine dependence, unspecified, uncomplicated: Secondary | ICD-10-CM

## 2016-05-18 DIAGNOSIS — T17320S Food in larynx causing asphyxiation, sequela: Secondary | ICD-10-CM | POA: Diagnosis not present

## 2016-05-18 DIAGNOSIS — Z7189 Other specified counseling: Secondary | ICD-10-CM

## 2016-05-18 DIAGNOSIS — R55 Syncope and collapse: Secondary | ICD-10-CM

## 2016-05-18 LAB — CBC WITH DIFFERENTIAL/PLATELET
BASOS ABS: 0 10*3/uL (ref 0.0–0.1)
BASOS PCT: 0.6 % (ref 0.0–3.0)
Eosinophils Absolute: 0.3 10*3/uL (ref 0.0–0.7)
Eosinophils Relative: 4.1 % (ref 0.0–5.0)
HEMATOCRIT: 46.3 % (ref 39.0–52.0)
Hemoglobin: 16.1 g/dL (ref 13.0–17.0)
LYMPHS PCT: 17.6 % (ref 12.0–46.0)
Lymphs Abs: 1.4 10*3/uL (ref 0.7–4.0)
MCHC: 34.7 g/dL (ref 30.0–36.0)
MCV: 93.2 fl (ref 78.0–100.0)
MONOS PCT: 8.5 % (ref 3.0–12.0)
Monocytes Absolute: 0.7 10*3/uL (ref 0.1–1.0)
NEUTROS ABS: 5.6 10*3/uL (ref 1.4–7.7)
Neutrophils Relative %: 69.2 % (ref 43.0–77.0)
PLATELETS: 149 10*3/uL — AB (ref 150.0–400.0)
RBC: 4.97 Mil/uL (ref 4.22–5.81)
RDW: 13.7 % (ref 11.5–15.5)
WBC: 8.1 10*3/uL (ref 4.0–10.5)

## 2016-05-18 LAB — HEMOGLOBIN A1C: Hgb A1c MFr Bld: 8.3 % — ABNORMAL HIGH (ref 4.6–6.5)

## 2016-05-18 NOTE — Progress Notes (Signed)
Pre visit review using our clinic review tool, if applicable. No additional management support is needed unless otherwise documented below in the visit note. 

## 2016-05-18 NOTE — Progress Notes (Signed)
Medicare visit tabled due to syncope hx.    Swallowing is better, no episodes of choking now that he isn't taking liquids and solids at the same time.  D/w pt.   Smoking less, down to 3 cigs a day with nicotine gum.  D/w pt.   If incapacitated, would have his wife designated.    [facility].    Syncope hx: passed out in the shower about 1 month ago.  He prev had episodes of near/syncope with choking and once in the midst of spirometry years ago, but those were different than the most recent episode.  Recently was in the shower, but woke up on the floor.  Was lightheaded before it happened, he could feel that change but wasn't able to sit down before syncope.  No CP, then or since.  No BLE edema.  He can occ get lightheaded with position changes, with yardwork.  This is overall rare.  No vertigo sx.  No B/B incontinence with the recent episode.  He doesn't have heart racing.   Was fasting at the time of the event in the shower. Sugar elevation noted on labs.  D/w pt.   PMH and SH reviewed  ROS: Per HPI unless specifically indicated in ROS section   Meds, vitals, and allergies reviewed.   GEN: nad, alert and oriented HEENT: mucous membranes moist NECK: supple w/o LA CV: rrr.  no murmur PULM: ctab, no inc wob ABD: soft, +bs EXT: no edema SKIN: no acute rash CN 2-12 wnl B, S/S/DTR wnl x4  EKG reviewed.  No old for comparison.

## 2016-05-18 NOTE — Patient Instructions (Signed)
Go to the lab on the way out.  We'll contact you with your lab report. Rosaria Ferries will call about your referrals.  See her on the way out.  Take care.  Glad to see you.

## 2016-05-19 ENCOUNTER — Other Ambulatory Visit: Payer: Self-pay | Admitting: Family Medicine

## 2016-05-19 ENCOUNTER — Encounter: Payer: Self-pay | Admitting: Family Medicine

## 2016-05-19 DIAGNOSIS — R55 Syncope and collapse: Secondary | ICD-10-CM | POA: Insufficient documentation

## 2016-05-19 DIAGNOSIS — Z7189 Other specified counseling: Secondary | ICD-10-CM | POA: Insufficient documentation

## 2016-05-19 DIAGNOSIS — E119 Type 2 diabetes mellitus without complications: Secondary | ICD-10-CM

## 2016-05-19 NOTE — Assessment & Plan Note (Signed)
Improved with swallowing precautions, not taking fluids and solids at same time. Continue as is. He agrees.

## 2016-05-19 NOTE — Assessment & Plan Note (Signed)
Needs evaluation for diabetes. See notes on follow-up labs. Unclear if he had an episode of hypoglycemia or relative hypoglycemia that could've caused syncope in the shower. See notes about syncope.

## 2016-05-19 NOTE — Assessment & Plan Note (Signed)
I am counting this as a single event. It happened about a month ago, no events in the meantime. His previous episodes of syncope or near syncope in the past or either associated with choking or intentional hyperventilation with spirometry. This episode was clearly different. He did not have vertigo symptoms. He felt lightheaded before he passed out. This was in the shower. Unclear if the temperature difference in the shower or if relative hypoglycemia could've caused this to happen. At this point he has no new neurologic findings. Check echo and carotid studies. Refer to cardiology. He has a bundle branch block noted on EKG that limits the interpretation of his EKG but it does not appear to have any acute changes otherwise. He does not have chest pain or shortness of breath and he appears to be okay for outpatient follow-up. I appreciate cardiology help. Check hemoglobin today. Other labs that were previously resulted are discussed with patient.

## 2016-05-19 NOTE — Assessment & Plan Note (Signed)
See above re: counseling for cessation. Continue nicotine gum.

## 2016-05-19 NOTE — Assessment & Plan Note (Signed)
If incapacitated, would have his wife designated.  

## 2016-06-08 ENCOUNTER — Encounter: Payer: Self-pay | Admitting: *Deleted

## 2016-06-08 ENCOUNTER — Encounter: Payer: Medicare Other | Attending: Family Medicine | Admitting: *Deleted

## 2016-06-08 VITALS — BP 120/72 | Ht 70.0 in | Wt 225.2 lb

## 2016-06-08 DIAGNOSIS — E119 Type 2 diabetes mellitus without complications: Secondary | ICD-10-CM | POA: Insufficient documentation

## 2016-06-08 DIAGNOSIS — Z713 Dietary counseling and surveillance: Secondary | ICD-10-CM | POA: Diagnosis present

## 2016-06-08 NOTE — Patient Instructions (Signed)
Check blood sugars 2 x day before breakfast and 2 hrs after supper every day  Exercise: Begin walking  for 10-15 minutes  5 days a week and gradually increase to 30 minutes 5 x week  Eat 3 meals day,   1-2 snacks a day Space meals 4-6 hours apart Limit fried foods and desserts/sweets Avoid sugar sweetened drinks (tea, coffee, juices)  Quit smoking  Bring blood sugar records to the next class  Return for classes on:

## 2016-06-08 NOTE — Progress Notes (Signed)
Diabetes Self-Management Education  Visit Type: First/Initial  Appt. Start Time: 1020 Appt. End Time: 1120  06/08/2016  [name], identified by name and date of birth, is a 65 y.o. male with a diagnosis of Diabetes: Type 2.   ASSESSMENT  Blood pressure 120/72, height 5\' 10"  (1.778 m), weight 225 lb 3.2 oz (102.2 kg). Body mass index is 32.31 kg/m.      Diabetes Self-Management Education - 06/08/16 1156      Visit Information   Visit Type First/Initial     Initial Visit   Diabetes Type Type 2   Are you currently following a meal plan? No   Are you taking your medications as prescribed? Yes   Date Diagnosed 3 weeks ago     Health Coping   How would you rate your overall health? Good     Psychosocial Assessment   Patient Belief/Attitude about Diabetes Other (comment)  "worries me some"   Self-care barriers None   Self-management support Doctor's office;Family   Other persons present Spouse/SO   Patient Concerns Nutrition/Meal planning;Monitoring;Glycemic Control   Special Needs None   Preferred Learning Style Hands on   Learning Readiness Ready   How often do you need to have someone help you when you read instructions, pamphlets, or other written materials from your doctor or pharmacy? 1 - Never   What is the last grade level you completed in school? 12 +     Pre-Education Assessment   Patient understands the diabetes disease and treatment process. Needs Instruction   Patient understands incorporating nutritional management into lifestyle. Needs Instruction   Patient undertands incorporating physical activity into lifestyle. Needs Instruction   Patient understands using medications safely. Needs Instruction   Patient understands monitoring blood glucose, interpreting and using results Needs Instruction   Patient understands prevention, detection, and treatment of acute complications. Needs Instruction   Patient understands prevention, detection, and treatment  of chronic complications. Needs Instruction   Patient understands how to develop strategies to address psychosocial issues. Needs Instruction   Patient understands how to develop strategies to promote health/change behavior. Needs Instruction     Complications   Last HgB A1C per patient/outside source 8.3 %  05/18/16   How often do you check your blood sugar? 1-2 times/day   Postprandial Blood glucose range (mg/dL) >200  Pt just got meter and checked 2 readings in the morning after drinking coffee with sugar. Today's reading was 217 mg/dL.    Have you had a dilated eye exam in the past 12 months? Yes   Have you had a dental exam in the past 12 months? Yes   Are you checking your feet? No     Dietary Intake   Breakfast eggs, oatmeal, bacon, sausage, pancakes, sweet rolls, muffins   Lunch sandwich, hot dog, hamburger, soup   Snack (afternoon) cookie, cheese and crackers   Dinner broiled fish, steak, pork chops, potatoes, bread, broccolli, tomatoes, peas, beans, okra   Beverage(s) water, sugar sweetened tea and coffee, juice     Exercise   Exercise Type ADL's     Patient Education   Previous Diabetes Education No   Disease state  Definition of diabetes, type 1 and 2, and the diagnosis of diabetes   Nutrition management  Role of diet in the treatment of diabetes and the relationship between the three main macronutrients and blood glucose level   Physical activity and exercise  Role of exercise on diabetes management, blood pressure control and cardiac  health.   Monitoring Purpose and frequency of SMBG.;Identified appropriate SMBG and/or A1C goals.   Chronic complications Relationship between chronic complications and blood glucose control   Psychosocial adjustment Role of stress on diabetes;Identified and addressed patients feelings and concerns about diabetes   Personal strategies to promote health Review risk of smoking and offered smoking cessation     Individualized Goals (developed  by patient)   Reducing Risk Improve blood sugars Prevent diabetes complications Quit smoking     Outcomes   Expected Outcomes Demonstrated interest in learning. Expect positive outcomes      Individualized Plan for Diabetes Self-Management Training:   Learning Objective:  Patient will have a greater understanding of diabetes self-management. Patient education plan is to attend individual and/or group sessions per assessed needs and concerns.   Plan:   Patient Instructions  Check blood sugars 2 x day before breakfast and 2 hrs after supper every day Exercise: Begin walking  for 10-15 minutes  5 days a week and gradually increase to 30 minutes 5 x week Eat 3 meals day,   1-2 snacks a day Space meals 4-6 hours apart Limit fried foods and desserts/sweets Avoid sugar sweetened drinks (tea, coffee, juices) Quit smoking Bring blood sugar records to the next class   Expected Outcomes:  Demonstrated interest in learning. Expect positive outcomes  Education material provided: General Meal Planning Guidelines Simple Meal Plan  If problems or questions, patient to contact team via:  Johny Drilling, [place], [place], CDE [phone]  Future DSME appointment:  Thursday June 17, 2016 for Diabetes Class 1

## 2016-06-09 ENCOUNTER — Ambulatory Visit (INDEPENDENT_AMBULATORY_CARE_PROVIDER_SITE_OTHER): Payer: Medicare Other

## 2016-06-09 ENCOUNTER — Other Ambulatory Visit: Payer: Self-pay

## 2016-06-09 ENCOUNTER — Ambulatory Visit: Payer: Medicare Other

## 2016-06-09 DIAGNOSIS — R55 Syncope and collapse: Secondary | ICD-10-CM | POA: Diagnosis not present

## 2016-06-15 ENCOUNTER — Ambulatory Visit (INDEPENDENT_AMBULATORY_CARE_PROVIDER_SITE_OTHER): Payer: Medicare Other | Admitting: Cardiology

## 2016-06-15 ENCOUNTER — Encounter: Payer: Self-pay | Admitting: Cardiology

## 2016-06-15 VITALS — BP 120/77 | HR 63 | Ht 71.0 in | Wt 224.5 lb

## 2016-06-15 DIAGNOSIS — F17200 Nicotine dependence, unspecified, uncomplicated: Secondary | ICD-10-CM

## 2016-06-15 DIAGNOSIS — R9431 Abnormal electrocardiogram [ECG] [EKG]: Secondary | ICD-10-CM | POA: Diagnosis not present

## 2016-06-15 DIAGNOSIS — R55 Syncope and collapse: Secondary | ICD-10-CM

## 2016-06-15 NOTE — Patient Instructions (Signed)
Testing/Procedures: [place]  Your caregiver has ordered a Stress Test with nuclear imaging. The purpose of this test is to evaluate the blood supply to your heart muscle. This procedure is referred to as a "Non-Invasive Stress Test." This is because other than having an IV started in your vein, nothing is inserted or "invades" your body. Cardiac stress tests are done to find areas of poor blood flow to the heart by determining the extent of coronary artery disease (CAD). Some patients exercise on a treadmill, which naturally increases the blood flow to your heart, while others who are  unable to walk on a treadmill due to physical limitations have a pharmacologic/chemical stress agent called Lexiscan . This medicine will mimic walking on a treadmill by temporarily increasing your coronary blood flow.   Please note: these test may take anywhere between 2-4 hours to complete  PLEASE REPORT TO [facility] AT THE FIRST DESK WILL DIRECT YOU WHERE TO GO  Date of Procedure:_Tuesday June 22, 2016 at 08:00AM__  Arrival Time for Procedure:__Arrive at 07:45AM to register___  Instructions regarding medication:   _X___ : Hold diabetes medication morning of procedure   PLEASE NOTIFY THE OFFICE AT LEAST 24 HOURS IN ADVANCE IF YOU ARE UNABLE TO KEEP YOUR APPOINTMENT.  [phone] AND  PLEASE NOTIFY NUCLEAR MEDICINE AT [facility] AT LEAST 24 HOURS IN ADVANCE IF YOU ARE UNABLE TO KEEP YOUR APPOINTMENT. [phone]  How to prepare for your Myoview test:  1. Do not eat or drink after midnight 2. No caffeine for 24 hours prior to test 3. No smoking 24 hours prior to test. 4. Your medication may be taken with water.  If your doctor stopped a medication because of this test, do not take that medication. 5. Ladies, please do not wear dresses.  Skirts or pants are appropriate. Please wear a short sleeve shirt. 6. No perfume, cologne or lotion. 7. Wear comfortable walking  shoes. No heels!   Follow-Up: Your physician recommends that you schedule a follow-up appointment after testing with Dr. Yvone Neu.  It was a pleasure seeing you today here in the office. Please do not hesitate to give [place] a call back if you have any further questions. [place], BSN     Cardiac Nuclear Scanning A cardiac nuclear scan is used to check your heart for problems, such as the following:  A portion of the heart is not getting enough blood.  Part of the heart muscle has died, which happens with a heart attack.  The heart wall is not working normally.  In this test, a radioactive dye (tracer) is injected into your bloodstream. After the tracer has traveled to your heart, a scanning device is used to measure how much of the tracer is absorbed by or distributed to various areas of your heart. LET [facility] CARE PROVIDER KNOW ABOUT:  Any allergies you have.  All medicines you are taking, including vitamins, herbs, eye drops, creams, and over-the-counter medicines.  Previous problems you or members of your family have had with the use of anesthetics.  Any blood disorders you have.  Previous surgeries you have had.  Medical conditions you have.  RISKS AND COMPLICATIONS Generally, this is a safe procedure. However, as with any procedure, problems can occur. Possible problems include:   Serious chest pain.  Rapid heartbeat.  Sensation of warmth in your chest. This usually passes quickly. BEFORE THE PROCEDURE Ask your health care provider about changing or  stopping your regular medicines. PROCEDURE This procedure is usually done at a hospital and takes 2-4 hours.  An IV tube is inserted into one of your veins.  Your health care provider will inject a small amount of radioactive tracer through the tube.  You will then wait for 20-40 minutes while the tracer travels through your bloodstream.  You will lie down on an exam table so images of your  heart can be taken. Images will be taken for about 15-20 minutes.  You will exercise on a treadmill or stationary bike. While you exercise, your heart activity will be monitored with an electrocardiogram (ECG), and your blood pressure will be checked.  If you are unable to exercise, you may be given a medicine to make your heart beat faster.  When blood flow to your heart has peaked, tracer will again be injected through the IV tube.  After 20-40 minutes, you will get back on the exam table and have more images taken of your heart.  When the procedure is over, your IV tube will be removed. AFTER THE PROCEDURE  You will likely be able to leave shortly after the test. Unless your health care provider tells you otherwise, you may return to your normal schedule, including diet, activities, and medicines.  Make sure you find out how and when you will get your test results.   This information is not intended to replace advice given to you by your health care provider. Make sure you discuss any questions you have with your health care provider.   Document Released: 09/03/2004 Document Revised: 08/14/2013 Document Reviewed: 07/18/2013 Elsevier Interactive Patient Education [employer].

## 2016-06-15 NOTE — Progress Notes (Signed)
Cardiology Office Note   Date:  06/15/2016   ID:  Elkin, Vaziri [birth year], MRN [id number]  Referring Doctor:  Elsie Stain, MD   Cardiologist:   Wende Bushy, MD   Reason for consultation:  Chief Complaint  Patient presents with  . OTHER    Syncope and fuzzy feeling. Meds reviewed verbally with pt.      History of Present Illness: Carl Barnes is a 65 y.o. male who presents for Evaluation for episode of loss of consciousness, abnormal EKG.  Patient reports an episode of passing out while in the shower. When he woke up that day, he felt well. He went out to get the newspaper. He recalls taking out some chlorine to the swimming pool. He remembers maybe getting a whiff of chlorine. He remembers walking into the shower. He did not have any symptoms prior to falling down to the floor. No chest pain, no shortness of breath, no lightheadedness. The bathroom was a small area and when he fell down the floor, he moved the toilet away from the sink. He did not hit his head anywhere. There was no bleeding or bruising. He did not seek medical evaluation.   He informed his PCP about that event during his and will physical examination. At that visit, he was also diagnosed to have diabetes.  Since that episode, he has had no recurrence of passing out. 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 He denies chest pain and shortness of breath. Denies PND, orthopnea, edema.   ROS:  Please see the history of present illness. Aside from mentioned under HPI, all other systems are reviewed and negative.     Past Medical History:  Diagnosis Date  . Cholangitis 12/11   [facility] admission - ERCP for stone removal per GI  . Diabetes mellitus without complication ([place])   . Elevated glucose 10/16 to 06/09/2004   [facility] admission, mild elevated cholesterol  . Hyperlipidemia 12/21/1997  . Poisoning by black widow spider bite 1979  . Renal stones     Past Surgical History:  Procedure  Laterality Date  . Cardiolite  06/08/2004   WNL  . Chest CT w/contrast  06/08/04   Negative PE  . CHOLECYSTECTOMY  01/2011   Diseased GB.  [facility]  . Head MRI  12/99   Normal (R/O acoustic neuroma)     reports that he has been smoking Cigarettes.  He has a 6.25 pack-year smoking history. He has quit using smokeless tobacco. He reports that he drinks about 2.4 - 3.6 oz of alcohol per week . He reports that he does not use drugs.   family history includes Cancer in his mother; Heart disease in his brother and paternal grandfather; Hyperlipidemia in his mother; Stroke in his brother.   Outpatient Medications Prior to Visit  Medication Sig Dispense Refill  . aspirin 81 MG tablet Take 81 mg by mouth daily.    . cetirizine (ZYRTEC) 10 MG tablet Take 10 mg by mouth daily.    . nicotine polacrilex (NICORETTE) 4 MG gum Take 4 mg by mouth as needed for smoking cessation.     No facility-administered medications prior to visit.      Allergies: Codeine    PHYSICAL EXAM: VS:  BP 120/77 (BP Location: Left Arm, Patient Position: Sitting, Cuff Size: Large)   Pulse 63   Ht 5\' 11"  (1.803 m)   Wt 224 lb 8 oz (101.8 kg)   BMI 31.31 kg/m  ,  Body mass index is 31.31 kg/m. Wt Readings from Last 3 Encounters:  06/15/16 224 lb 8 oz (101.8 kg)  06/08/16 225 lb 3.2 oz (102.2 kg)  05/18/16 226 lb (102.5 kg)     Orthostatic VS for the past 24 hrs:  BP- Lying Pulse- Lying BP- Sitting Pulse- Sitting BP- Standing at 0 minutes Pulse- Standing at 0 minutes  06/15/16 0914 122/78 62 129/78 64 108/72 65      GENERAL:  well developed, well nourished, obese, not in acute distress HEENT: normocephalic, pink conjunctivae, anicteric sclerae, no xanthelasma, normal dentition, oropharynx clear NECK:  no neck vein engorgement, JVP normal, no hepatojugular reflux, carotid upstroke brisk and symmetric, no bruit, no thyromegaly, no lymphadenopathy LUNGS:  good respiratory effort, clear to auscultation  bilaterally CV:  PMI not displaced, no thrills, no lifts, S1 and S2 within normal limits, no palpable S3 or S4, no murmurs, no rubs, no gallops ABD:  Soft, nontender, nondistended, normoactive bowel sounds, no abdominal aortic bruit, no hepatomegaly, no splenomegaly MS: nontender back, no kyphosis, no scoliosis, no joint deformities EXT:  2+ DP/PT pulses, no edema, no varicosities, no cyanosis, no clubbing SKIN: warm, nondiaphoretic, normal turgor, no ulcers NEUROPSYCH: alert, oriented to person, place, and time, sensory/motor grossly intact, normal mood, appropriate affect  Recent Labs: 05/11/2016: ALT 20; BUN 16; Creatinine, Ser 1.08; Potassium 4.8; Sodium 141 05/18/2016: Hemoglobin 16.1; Platelets 149.0   Lipid Panel    Component Value Date/Time   CHOL 147 05/11/2016 0846   TRIG 207.0 (H) 05/11/2016 0846   HDL 25.30 (L) 05/11/2016 0846   CHOLHDL 6 05/11/2016 0846   VLDL 41.4 (H) 05/11/2016 0846   LDLCALC 106 (H) 02/18/2012 0830   LDLDIRECT 93.0 05/11/2016 0846     Other studies Reviewed:  EKG:  The ekg from 06/15/2016 was personally reviewed by me and it revealed sinus rhythm, 63 BPM. LVH. QRS widening to 160 ms. Abnormal EKG.  Additional studies/ records that were reviewed personally reviewed by me today include: Echo 06/09/2016: Left ventricle: The cavity size was normal. Wall thickness was   increased in a pattern of mild LVH. Systolic function was normal.   The estimated ejection fraction was in the range of 55% to 60%.   Wall motion was normal; there were no regional wall motion   abnormalities. Left ventricular diastolic function parameters   were normal for the patient&'s age. - Mitral valve: There was mild regurgitation.  Carotid ultrasound 06/09/2016: Normal carotid arteries, bilaterally. Patent vertebral arteries with antegrade flow. Normal subclavian arteries, bilaterally.   ASSESSMENT AND PLAN:  Loss of consciousness Based on history and orthostatic vital  signs, there is likely be contribution of orthostatic drop in his blood pressure. Advised about taking care and changing positions, doing changing positions slowly.  Abnormal EKG Risk factors for CAD include age, gender, smoking, diabetes Recommend further evaluation with nuclear stress test. We will attempt an exercise nuclear stress test to check for chronotropic competence. if patient is unable to reach target heart rate, we will switch to a pharmacologic nuclear stress test.  Diabetes Recent diagnosis. Ideal recommended LDL goal because of this diagnosis would be an LDL of less than 70.  Tobacco use We discussed the importance of smoking cessation and different strategies for quitting.    Current medicines are reviewed at length with the patient today.  The patient does not have concerns regarding medicines.  Labs/ tests ordered today include:  Orders Placed This Encounter  Procedures  . NM Myocar Multi  W/Spect W/Wall Motion / EF  . EKG 12-Lead    I had a lengthy and detailed discussion with the patient regarding diagnoses, prognosis, diagnostic options, treatment options , and side effects of medications.   I counseled the patient on importance of lifestyle modification including heart healthy diet, regular physical activity once cardiac clearance is completed, and smoking cessation.   Disposition:   FU with undersigned after tests   I spent at least 46minutes with the patient today and more than 50% of the time was spent counseling the patient and coordinating care.     Signed, Wende Bushy, MD  06/15/2016 10:16 AM    [place]  This note was generated in part with voice recognition software and I apologize for any typographical errors that were not detected and corrected.

## 2016-06-17 ENCOUNTER — Encounter: Payer: Self-pay | Admitting: *Deleted

## 2016-06-17 ENCOUNTER — Encounter: Payer: Medicare Other | Admitting: *Deleted

## 2016-06-17 VITALS — Wt 222.5 lb

## 2016-06-17 DIAGNOSIS — Z713 Dietary counseling and surveillance: Secondary | ICD-10-CM | POA: Diagnosis not present

## 2016-06-17 DIAGNOSIS — E119 Type 2 diabetes mellitus without complications: Secondary | ICD-10-CM

## 2016-06-17 NOTE — Progress Notes (Signed)
Appt. Start Time: 0900 Appt. End Time: 1130  Fax sent to Dr Josefine Class office regarding elevated blood sugars. Pt isn't taking any diabetes medications and doesn't have follow up scheduled with MD.   Class 1 Diabetes Overview - define DM; state own type of DM; identify functions of pancreas and insulin; define insulin deficiency vs insulin resistance  Psychosocial - identify DM as a source of stress; state the effects of stress on BG control; verbalize appropriate stress management techniques; identify personal stress issues   Nutritional Management - describe effects of food on blood glucose; identify sources of carbohydrate, protein and fat; verbalize the importance of balance meals in controlling blood glucose; identify meals as well balanced or not; estimate servings of carbohydrate from menus; use food labels to identify servings size, content of carbohydrate, fiber, protein, fat, saturated fat and sodium; recognize food sources of fat, saturated fat, trans fat, sodium and verbalize goals for intake; describe healthful appropriate food choices when dining out   Exercise - describe the effects of exercise on blood glucose and importance of regular exercise in controlling diabetes; state a plan for personal exercise; verbalize contraindications for exercise  Self-Monitoring - state importance of HBGM and demo procedure accurately; use HBGM results to effectively manage diabetes; identify importance of regular HbA1C testing and goals for results  Acute Complications/Sick Day Guidelines - recognize hyperglycemia and hypoglycemia with causes and effects; identify blood glucose results as high, low or in control; list steps in treating and preventing high and low blood glucose; state appropriate measure to manage blood glucose when ill (need for meds, HBGM plan, when to call physician, need for fluids)  Chronic Complications/Foot, Skin, Eye Dental Care - identify possible long-term complications of  diabetes (retinopathy, neuropathy, nephropathy, cardiovascular disease, infections); explain steps in prevention and treatment of chronic complications; state importance of daily self-foot exams; describe how to examine feet and what to look for; explain appropriate eye and dental care  Lifestyle Changes/Goals & Health/Community Resources - state benefits of making appropriate lifestyle changes; identify habits that need to change (meals, tobacco, alcohol); identify strategies to reduce risk factors for personal health; set goals for proper diabetes care; state need for and frequency of healthcare follow-up; describe appropriate community resources for good health (ADA, web sites, apps)   Pregnancy/Sexual Health - define gestational diabetes; state importance of good blood glucose control and birth control prior to pregnancy; state importance of good blood glucose control in preventing sexual problems (impotence, vaginal dryness, infections, loss of desire); state relationship of blood glucose control and pregnancy outcome; describe risk of maternal and fetal complications  Teaching Materials Used: Class 1 Slides/Notebook Diabetes Booklet ID Card  Medic Alert/Medic ID Forms Sleep Evaluation Exercise Handout Daily Food Record Planning a Balanced Meal Goals for Class 1

## 2016-06-21 ENCOUNTER — Telehealth: Payer: Self-pay | Admitting: Cardiology

## 2016-06-21 NOTE — Telephone Encounter (Signed)
Reviewed myoview instructions w/pt who verbalized understanding.  

## 2016-06-22 ENCOUNTER — Encounter
Admission: RE | Admit: 2016-06-22 | Discharge: 2016-06-22 | Disposition: A | Discharge status: Home / Self Care | Payer: Medicare Other | Source: Ambulatory Visit | Attending: Cardiology | Admitting: Cardiology

## 2016-06-22 DIAGNOSIS — R55 Syncope and collapse: Secondary | ICD-10-CM | POA: Diagnosis present

## 2016-06-22 LAB — NM MYOCAR MULTI W/SPECT W/WALL MOTION / EF
CHL CUP NUCLEAR SRS: 7
CHL CUP RESTING HR STRESS: 60 {beats}/min
CSEPEDS: 30 s
CSEPEW: 7 METS
CSEPHR: 99 %
CSEPPHR: 153 {beats}/min
Exercise duration (min): 6 min
LV dias vol: 75 mL (ref 62–150)
LV sys vol: 37 mL
NUC STRESS TID: 0.85
SDS: 1
SSS: 5

## 2016-06-22 MED ORDER — TECHNETIUM TC 99M TETROFOSMIN IV KIT
31.3290 | PACK | Freq: Once | INTRAVENOUS | Status: AC | PRN
  Administered 2016-06-22: 31.329 via INTRAVENOUS

## 2016-06-22 MED ORDER — TECHNETIUM TC 99M TETROFOSMIN IV KIT
13.9300 | PACK | Freq: Once | INTRAVENOUS | Status: AC | PRN
  Administered 2016-06-22: 13.393 via INTRAVENOUS

## 2016-06-23 ENCOUNTER — Ambulatory Visit (INDEPENDENT_AMBULATORY_CARE_PROVIDER_SITE_OTHER): Payer: Medicare Other | Admitting: Cardiology

## 2016-06-23 ENCOUNTER — Encounter: Payer: Self-pay | Admitting: Cardiology

## 2016-06-23 VITALS — BP 110/70 | HR 73 | Ht 71.0 in | Wt 221.1 lb

## 2016-06-23 DIAGNOSIS — R9439 Abnormal result of other cardiovascular function study: Secondary | ICD-10-CM

## 2016-06-23 DIAGNOSIS — F17200 Nicotine dependence, unspecified, uncomplicated: Secondary | ICD-10-CM

## 2016-06-23 DIAGNOSIS — R9431 Abnormal electrocardiogram [ECG] [EKG]: Secondary | ICD-10-CM

## 2016-06-23 DIAGNOSIS — R55 Syncope and collapse: Secondary | ICD-10-CM | POA: Diagnosis not present

## 2016-06-23 DIAGNOSIS — Z01810 Encounter for preprocedural cardiovascular examination: Secondary | ICD-10-CM | POA: Diagnosis not present

## 2016-06-23 NOTE — Patient Instructions (Addendum)
Testing/Procedures: [facility] Cardiac Cath Instructions   You are scheduled for a Cardiac Cath [id number] July 06, 2016 at 08:30AM  Please arrive at 07:30am on the day of your procedure  Please expect a call from our [facility] to pre-register you  Do not eat/drink anything after midnight  Someone will need to drive you home  It is recommended someone be with you for the first 24 hours after your procedure  Wear clothes that are easy to get on/off and wear slip on shoes if possible   Medications bring a current list of all medications with you  _X__ You may take all of your medications the morning of your procedure with enough water to swallow safely    Day of your procedure: Arrive at the [facility] entrance.  Free valet service is available.  After entering the [facility] please check-in at the registration desk (1st desk on your right) to receive your armband. After receiving your armband someone will escort you to the cardiac cath/special procedures waiting area.  The usual length of stay after your procedure is about 2 to 3 hours.  This can vary.  If you have any questions, please call our office at [phone], or you may call the cardiac cath lab at [facility] directly at [phone]   Follow-Up: Your physician recommends that you schedule a follow-up appointment in: 3 weeks with Dr. Yvone Neu.  It was a pleasure seeing you today here in the office. Please do not hesitate to give [place] a call back if you have any further questions. [place], BSN      Coronary Angioplasty Coronary angioplasty is a procedure to widen a narrowed or blocked blood vessel of the heart (coronary arteries). The artery is usually blocked by cholesterol buildup (plaque) in the lining or walls. When a vessel in the heart becomes partially blocked, there is decreased blood flow to that area. This may lead to chest pain or a heart attack (myocardial infarction).    LET [facility] CARE PROVIDER KNOW ABOUT:  Any allergies you have, including allergies to shellfish or contrast dye.  All medicines you are taking, including vitamins, herbs, eye drops, creams, and over-the-counter medicines.  Previous problems you or members of your family have had with the use of anesthetics.  Any blood disorders you have.  Previous surgeries you have had.  Any previous kidney problems or failure you have had.  Medical conditions you have.  Possibility of pregnancy, if this applies. RISKS AND COMPLICATIONS Generally, this is a safe procedure. However, problems can occur and include:   Injury to the blood vessels, including rupture or bleeding.   Infection, bleeding, or bruising at the catheter site.   Allergic reaction to the dye or contrast used.   Kidney damage from the dye or contrast used.   Blood clots that can lead to a stroke or heart attack.   Bleeding into the abdomen (retroperitoneal bleeding). BEFORE THE PROCEDURE  Do not eat or drink anything after midnight on the night before the procedure or as directed by your health care provider.  Ask your health care provider if you can take a sip of water with any approved medicines the morning of the procedure.   Ask your health care provider about changing or stopping your regular medicines. This is especially important if you are taking diabetes medicines or blood thinners. PROCEDURE  You may be given a medicine through an IV tube to help you relax (sedative)  before and during the procedure.   The area where the catheter will be inserted will be washed and shaved. This is usually done in the groin but may be done in the fold of your arm (near your elbow) or in the wrist.   A medicine will be given to numb the area where the catheter will be inserted (local anesthetic).   The catheter will be inserted into an artery using a guide wire. The catheter will be guided to the location of the  narrowed or blocked artery with a type of X-ray called fluoroscopy.   Dye will then be injected and X-rays taken. The dye will help to show where any narrowing or blockages are located.   Once positioned at the narrowed or blocked portion of the blood vessel, a balloon will be inflated to make the artery wider. Expanding the balloon will crush the plaque into the wall of the vessel and improve the blood flow.   Sometimes the artery may be made wider by removing plaque using a laser or other tools.   When the blood flow is better, the balloon will be deflated and the catheter will be removed.   After the catheter is removed, a special dressing will be placed over the insertion site. AFTER THE PROCEDURE  If the procedure is done through the leg, you will be kept in bed lying flat for 6 hours. You will be told to not bend or cross your legs.   The insertion site will be checked often.   The pulse in your feet or wrist will be checked often.   Additional blood tests, X-rays, and an electrocardiogram (or electrocardiography) may be done.    This information is not intended to replace advice given to you by your health care provider. Make sure you discuss any questions you have with your health care provider.   Document Released: 08/06/2000 Document Revised: 08/30/2014 Document Reviewed: 04/16/2013 Elsevier Interactive Patient Education [employer].

## 2016-06-23 NOTE — Progress Notes (Signed)
Cardiology Office Note   Date:  06/23/2016   ID:  Carl Barnes, Carl Barnes 11-13-1950, MRN [id number]  Referring Doctor:  Elsie Stain, MD   Cardiologist:   Wende Bushy, MD   Reason for consultation:  Chief Complaint  Patient presents with  . Follow-up    NO CP, SOB OR SWELLING. NO OTHER COMPLAINTS.      History of Present Illness: Carl Barnes is a 65 y.o. male who presents for Follow-up after testing   She was seen previously for Evaluation for episode of loss of consciousness, abnormal EKG. Patient reported an episode of passing out while in the shower. When he woke up that day, he felt well. He went out to get the newspaper. He recalls taking out some chlorine to the swimming pool. He remembers maybe getting a whiff of chlorine. He remembers walking into the shower. He did not have any symptoms prior to falling down to the floor. No chest pain, no shortness of breath, no lightheadedness. The bathroom was a small area and when he fell down the floor, he moved the toilet away from the sink. He did not hit his head anywhere. There was no bleeding or bruising. He did not seek medical evaluation.   Since last visit, he has had no recurrence of passing out. He denies chest pain and shortness of breath. Denies PND, orthopnea, edema.   ROS:  Please see the history of present illness. Aside from mentioned under HPI, all other systems are reviewed and negative.     Past Medical History:  Diagnosis Date  . Cholangitis 12/11   [facility] admission - ERCP for stone removal per GI  . Diabetes mellitus without complication ([place])   . Elevated glucose 10/16 to 06/09/2004   [facility] admission, mild elevated cholesterol  . Hyperlipidemia 12/21/1997  . Poisoning by black widow spider bite 1979  . Renal stones     Past Surgical History:  Procedure Laterality Date  . Cardiolite  06/08/2004   WNL  . Chest CT w/contrast  06/08/04   Negative PE  . CHOLECYSTECTOMY  01/2011   Diseased GB.  [facility]  .  Head MRI  12/99   Normal (R/O acoustic neuroma)     reports that he has been smoking Cigarettes.  He has a 6.25 pack-year smoking history. He has quit using smokeless tobacco. He reports that he drinks about 2.4 - 3.6 oz of alcohol per week . He reports that he does not use drugs.   family history includes Cancer in his mother; Heart disease in his brother and paternal grandfather; Hyperlipidemia in his mother; Stroke in his brother.   Outpatient Medications Prior to Visit  Medication Sig Dispense Refill  . aspirin 81 MG tablet Take 81 mg by mouth daily.    . cetirizine (ZYRTEC) 10 MG tablet Take 10 mg by mouth daily.    . nicotine polacrilex (COMMIT) 2 MG lozenge Take 2 mg by mouth as needed for smoking cessation.     No facility-administered medications prior to visit.      Allergies: Codeine    PHYSICAL EXAM: VS:  BP 110/70 (BP Location: Left Arm, Patient Position: Sitting, Cuff Size: Normal)   Pulse 73   Ht 5\' 11"  (1.803 m)   Wt 221 lb 1.9 oz (100.3 kg)   SpO2 97%   BMI 30.84 kg/m  , Body mass index is 30.84 kg/m. Wt Readings from Last 3 Encounters:  06/23/16 221 lb 1.9 oz (100.3 kg)  06/17/16 222 lb 8 oz (100.9 kg)  06/15/16 224 lb 8 oz (101.8 kg)     GENERAL:  well developed, well nourished, obese, not in acute distress HEENT: normocephalic, pink conjunctivae, anicteric sclerae, no xanthelasma, normal dentition, oropharynx clear NECK:  no neck vein engorgement, JVP normal, no hepatojugular reflux, carotid upstroke brisk and symmetric, no bruit, no thyromegaly, no lymphadenopathy LUNGS:  good respiratory effort, clear to auscultation bilaterally CV:  PMI not displaced, no thrills, no lifts, S1 and S2 within normal limits, no palpable S3 or S4, no murmurs, no rubs, no gallops ABD:  Soft, nontender, nondistended, normoactive bowel sounds, no abdominal aortic bruit, no hepatomegaly, no splenomegaly MS: nontender back, no kyphosis, no scoliosis, no joint deformities EXT:   2+ DP/PT pulses, no edema, no varicosities, no cyanosis, no clubbing SKIN: warm, nondiaphoretic, normal turgor, no ulcers NEUROPSYCH: alert, oriented to person, place, and time, sensory/motor grossly intact, normal mood, appropriate affect  Recent Labs: 05/11/2016: ALT 20; BUN 16; Creatinine, Ser 1.08; Potassium 4.8; Sodium 141 05/18/2016: Hemoglobin 16.1; Platelets 149.0   Lipid Panel    Component Value Date/Time   CHOL 147 05/11/2016 0846   TRIG 207.0 (H) 05/11/2016 0846   HDL 25.30 (L) 05/11/2016 0846   CHOLHDL 6 05/11/2016 0846   VLDL 41.4 (H) 05/11/2016 0846   LDLCALC 106 (H) 02/18/2012 0830   LDLDIRECT 93.0 05/11/2016 0846     Other studies Reviewed:  EKG:  The ekg from 06/15/2016 was personally reviewed by me and it revealed sinus rhythm, 63 BPM. LVH. QRS widening to 160 ms. Abnormal EKG.  Additional studies/ records that were reviewed personally reviewed by me today include: Echo 06/09/2016: Left ventricle: The cavity size was normal. Wall thickness was   increased in a pattern of mild LVH. Systolic function was normal.   The estimated ejection fraction was in the range of 55% to 60%.   Wall motion was normal; there were no regional wall motion   abnormalities. Left ventricular diastolic function parameters   were normal for the patient&'s age. - Mitral valve: There was mild regurgitation.  Carotid ultrasound 06/09/2016: Normal carotid arteries, bilaterally. Patent vertebral arteries with antegrade flow. Normal subclavian arteries, bilaterally.  Nuclear stress test 06/22/2016:  Abnormal, intermediate risk study.  There is a large in size, moderate in severity, partially reversible inferior/inferoseptal defect consistent with ischmia and possible element of scar.  Left ventricular ejection is preserved with basal inferior and inferoseptal hypokinesis.   ASSESSMENT AND PLAN:  Loss of consciousness Abnormal EKG Abnormal stress test  Based on history and  orthostatic vital signs, there is likely be contribution of orthostatic drop in his blood pressure. Advised about taking care and changing positions, doing changing positions slowly.  Risk factors for CAD include age, gender, smoking, diabetes Stresses revealed chronotropic competence. However, it was abnormal with an intermediate risk result. Due to significant risk factors as mentioned above, discussed the option of proceeding with a definitive test of a left heart catheterization. Risks and benefits of cardiac catheterization have been discussed with the patient.  These include less than 1% chance of major complications including but not limited to bleeding, infection, kidney damage, arrhyhthmia, heart attack, stroke, and death.  The patient understands these risks and is willing to proceed.   Diabetes Recent diagnosis. Ideal recommended LDL goal because of this diagnosis would be an LDL of less than 70.  Tobacco use We discussed the importance of smoking cessation and different strategies for quitting.    Current medicines  are reviewed at length with the patient today.  The patient does not have concerns regarding medicines.  Labs/ tests ordered today include:  Orders Placed This Encounter  Procedures  . DG Chest 2 View  . CBC with Differential/Platelet  . Basic metabolic panel  . INR/PT    I had a lengthy and detailed discussion with the patient regarding diagnoses, prognosis, diagnostic options, treatment options , and side effects of medications.   I counseled the patient on importance of lifestyle modification including heart healthy diet, regular physical activity Once heart cath is completed, and smoking cessation.   Disposition:   FU with undersigned after tests   I spent at least 46minutes with the patient today and more than 50% of the time was spent counseling the patient and coordinating care.     Signed, Wende Bushy, MD  06/23/2016 4:21 PM    [place]  This note was generated in part with voice recognition software and I apologize for any typographical errors that were not detected and corrected.

## 2016-06-24 ENCOUNTER — Encounter: Payer: Medicare Other | Attending: Family Medicine | Admitting: *Deleted

## 2016-06-24 ENCOUNTER — Ambulatory Visit
Admission: RE | Admit: 2016-06-24 | Discharge: 2016-06-24 | Disposition: A | Discharge status: Home / Self Care | Payer: Medicare Other | Source: Ambulatory Visit | Attending: Cardiology | Admitting: Cardiology

## 2016-06-24 ENCOUNTER — Encounter: Payer: Self-pay | Admitting: *Deleted

## 2016-06-24 ENCOUNTER — Telehealth: Payer: Self-pay | Admitting: *Deleted

## 2016-06-24 VITALS — Wt 222.3 lb

## 2016-06-24 DIAGNOSIS — Z01810 Encounter for preprocedural cardiovascular examination: Secondary | ICD-10-CM | POA: Insufficient documentation

## 2016-06-24 DIAGNOSIS — I700 Atherosclerosis of aorta: Secondary | ICD-10-CM | POA: Diagnosis not present

## 2016-06-24 DIAGNOSIS — Z713 Dietary counseling and surveillance: Secondary | ICD-10-CM | POA: Insufficient documentation

## 2016-06-24 DIAGNOSIS — Z9049 Acquired absence of other specified parts of digestive tract: Secondary | ICD-10-CM | POA: Diagnosis not present

## 2016-06-24 DIAGNOSIS — E119 Type 2 diabetes mellitus without complications: Secondary | ICD-10-CM | POA: Diagnosis present

## 2016-06-24 DIAGNOSIS — Z01818 Encounter for other preprocedural examination: Secondary | ICD-10-CM

## 2016-06-24 DIAGNOSIS — R918 Other nonspecific abnormal finding of lung field: Secondary | ICD-10-CM | POA: Diagnosis not present

## 2016-06-24 LAB — CBC WITH DIFFERENTIAL/PLATELET
BASOS ABS: 0.1 10*3/uL (ref 0.0–0.2)
Basos: 1 %
EOS (ABSOLUTE): 0.3 10*3/uL (ref 0.0–0.4)
Eos: 4 %
HEMOGLOBIN: 16.4 g/dL (ref 12.6–17.7)
Hematocrit: 47.3 % (ref 37.5–51.0)
Immature Grans (Abs): 0 10*3/uL (ref 0.0–0.1)
Immature Granulocytes: 0 %
LYMPHS ABS: 1.6 10*3/uL (ref 0.7–3.1)
Lymphs: 21 %
MCH: 32.5 pg (ref 26.6–33.0)
MCHC: 34.7 g/dL (ref 31.5–35.7)
MCV: 94 fL (ref 79–97)
MONOS ABS: 0.7 10*3/uL (ref 0.1–0.9)
Monocytes: 9 %
NEUTROS ABS: 5 10*3/uL (ref 1.4–7.0)
Neutrophils: 65 %
PLATELETS: 159 10*3/uL (ref 150–379)
RBC: 5.04 x10E6/uL (ref 4.14–5.80)
RDW: 13.1 % (ref 12.3–15.4)
WBC: 7.6 10*3/uL (ref 3.4–10.8)

## 2016-06-24 LAB — BASIC METABOLIC PANEL
BUN / CREAT RATIO: 14 (ref 10–24)
BUN: 17 mg/dL (ref 8–27)
CALCIUM: 9.8 mg/dL (ref 8.6–10.2)
CHLORIDE: 101 mmol/L (ref 96–106)
CO2: 25 mmol/L (ref 18–29)
Creatinine, Ser: 1.2 mg/dL (ref 0.76–1.27)
GFR, EST AFRICAN AMERICAN: 72 mL/min/{1.73_m2} (ref >59)
GFR, EST NON AFRICAN AMERICAN: 63 mL/min/{1.73_m2} (ref >59)
Glucose: 200 mg/dL — ABNORMAL HIGH (ref 65–99)
POTASSIUM: 4.3 mmol/L (ref 3.5–5.2)
Sodium: 141 mmol/L (ref 134–144)

## 2016-06-24 LAB — PROTIME-INR

## 2016-06-24 NOTE — Progress Notes (Signed)
Appt. Start Time: 0900 Appt. End Time: 1200  Pt reports he was told to take it easy (no hunting, exercise, etc.) until he has cardiac cath next week. He had stress test on 10/31 and was told he had an MI in the past. He voices concerns regarding potential findings. Discussed complications today in class.   Class 2 Diabetes Overview - define DM; state own type of DM; identify functions of pancreas and insulin; define insulin deficiency vs insulin resistance  Psychosocial - identify DM as a source of stress; state the effects of stress on BG control; verbalize appropriate stress management techniques; identify personal stress issues   Nutritional Management - describe effects of food on blood glucose; identify sources of carbohydrate, protein and fat; verbalize the importance of balance meals in controlling blood glucose; identify meals as well balanced or not; estimate servings of carbohydrate from menus; use food labels to identify servings size, content of carbohydrate, fiber, protein, fat, saturated fat and sodium; recognize food sources of fat, saturated fat, trans fat, sodium and verbalize goals for intake; describe healthful appropriate food choices when dining out   Exercise - describe the effects of exercise on blood glucose and importance of regular exercise in controlling diabetes; state a plan for personal exercise; verbalize contraindications for exercise  Medications - state name, dose, timing of currently prescribed medications; describe types of medications available for diabetes  Self-Monitoring - state importance of HBGM and demo procedure accurately; use HBGM results to effectively manage diabetes; identify importance of regular HbA1C testing and goals for results  Acute Complications/Sick Day Guidelines - recognize hyperglycemia and hypoglycemia with causes and effects; identify blood glucose results as high, low or in control; list steps in treating and preventing high and low  blood glucose; state appropriate measure to manage blood glucose when ill (need for meds, HBGM plan, when to call physician, need for fluids)  Chronic Complications/Foot, Skin, Eye Dental Care - identify possible long-term complications of diabetes (retinopathy, neuropathy, nephropathy, cardiovascular disease, infections); explain steps in prevention and treatment of chronic complications; state importance of daily self-foot exams; describe how to examine feet and what to look for; explain appropriate eye and dental care  Lifestyle Changes/Goals & Health/Community Resources - state benefits of making appropriate lifestyle changes; identify habits that need to change (meals, tobacco, alcohol); identify strategies to reduce risk factors for personal health; set goals for proper diabetes care; state need for and frequency of healthcare follow-up; describe appropriate community resources for good health (ADA, web sites, apps)   Pregnancy/Sexual Health - define gestational diabetes; state importance of good blood glucose control and birth control prior to pregnancy; state importance of good blood glucose control in preventing sexual problems (impotence, vaginal dryness, infections, loss of desire); state relationship of blood glucose control and pregnancy outcome; describe risk of maternal and fetal complications  Teaching Materials Used: Class 2 Slide Packet A1C Pamphlet Foot Care Literature Menu Ideas Goals for Class 2

## 2016-06-24 NOTE — Telephone Encounter (Signed)
Reviewed results with patient and let him know that we would need to have one more lab because they did not have enough to process. Let him know that I would place a lab slip up front for him to pick up and he can go to the [facility] entrance anytime it is convenient for him to have that done prior to his procedure. He verbalized understanding and had no further questions at this time.

## 2016-06-24 NOTE — Telephone Encounter (Signed)
-----   Message from Wende Bushy, MD sent at 06/24/2016  9:25 AM EDT ----- Renal function ok, but gluc elevated. Pls fwd results to PCP ? Blood redraw for INR

## 2016-06-25 ENCOUNTER — Other Ambulatory Visit
Admission: RE | Admit: 2016-06-25 | Discharge: 2016-06-25 | Disposition: A | Discharge status: Home / Self Care | Payer: Medicare Other | Source: Ambulatory Visit | Attending: Cardiology | Admitting: Cardiology

## 2016-06-25 DIAGNOSIS — Z01818 Encounter for other preprocedural examination: Secondary | ICD-10-CM | POA: Insufficient documentation

## 2016-06-25 LAB — PROTIME-INR
INR: 1.09
Prothrombin Time: 14.1 seconds (ref 11.4–15.2)

## 2016-06-30 ENCOUNTER — Ambulatory Visit: Payer: Self-pay | Admitting: Cardiology

## 2016-07-01 ENCOUNTER — Encounter: Payer: Medicare Other | Admitting: Dietician

## 2016-07-01 ENCOUNTER — Encounter: Payer: Self-pay | Admitting: Dietician

## 2016-07-01 VITALS — BP 120/72 | Wt 223.5 lb

## 2016-07-01 DIAGNOSIS — Z713 Dietary counseling and surveillance: Secondary | ICD-10-CM | POA: Diagnosis not present

## 2016-07-01 DIAGNOSIS — E119 Type 2 diabetes mellitus without complications: Secondary | ICD-10-CM

## 2016-07-04 ENCOUNTER — Other Ambulatory Visit: Payer: Self-pay | Admitting: Internal Medicine

## 2016-07-04 DIAGNOSIS — R9439 Abnormal result of other cardiovascular function study: Secondary | ICD-10-CM

## 2016-07-05 NOTE — Progress Notes (Signed)
Appt. Start Time: 9:00am Appt. End Time: 12:00pm  Class 3 Psychosocial - identify DM as a source of stress; state the effects of stress on BG control; verbalize appropriate stress management techniques; identify personal stress issues   Nutritional Management - describe effects of food on blood glucose; identify sources of carbohydrate, protein and fat; verbalize the importance of balance meals in controlling blood glucose; identify meals as well balanced or not; estimate servings of carbohydrate from menus; use food labels to identify servings size, content of carbohydrate, fiber, protein, fat, saturated fat and sodium; recognize food sources of fat, saturated fat, trans fat, sodium and verbalize goals for intake; describe healthful appropriate food choices when dining out   Exercise - describe the effects of exercise on blood glucose and importance of regular exercise in controlling diabetes; state a plan for personal exercise; verbalize contraindications for exercise  Self-Monitoring - state importance of HBGM and demo procedure accurately; use HBGM results to effectively manage diabetes; identify importance of regular HbA1C testing and goals for results  Acute Complications/Sick Day Guidelines - recognize hyperglycemia and hypoglycemia with causes and effects; identify blood glucose results as high, low or in control; list steps in treating and preventing high and low blood glucose; state appropriate measure to manage blood glucose when ill (need for meds, HBGM plan, when to call physician, need for fluids)  Chronic Complications/Foot, Skin, Eye Dental Care - identify possible long-term complications of diabetes (retinopathy, neuropathy, nephropathy, cardiovascular disease, infections); explain steps in prevention and treatment of chronic complications; state importance of daily self-foot exams; describe how to examine feet and what to look for; explain appropriate eye and dental care  Lifestyle  Changes/Goals & Health/Community Resources - state benefits of making appropriate lifestyle changes; identify habits that need to change (meals, tobacco, alcohol); identify strategies to reduce risk factors for personal health; set goals for proper diabetes care; state need for and frequency of healthcare follow-up; describe appropriate community resources for good health (ADA, web sites, apps)   Teaching Materials Used: Class 3 Slide Packet Diabetes Stress Test Stress Management Tools Stress Poem Recipe/Menu Booklet Nutrition Prescription Fast Food Information Goal Setting Worksheet

## 2016-07-06 ENCOUNTER — Encounter: Payer: Self-pay | Admitting: *Deleted

## 2016-07-06 ENCOUNTER — Encounter
Admission: RE | Disposition: A | Discharge status: Home / Self Care | Payer: Self-pay | Source: Ambulatory Visit | Attending: Internal Medicine

## 2016-07-06 ENCOUNTER — Ambulatory Visit
Admission: RE | Admit: 2016-07-06 | Discharge: 2016-07-06 | Disposition: A | Discharge status: Home / Self Care | Payer: Medicare Other | Source: Ambulatory Visit | Attending: Internal Medicine | Admitting: Internal Medicine

## 2016-07-06 DIAGNOSIS — Z7982 Long term (current) use of aspirin: Secondary | ICD-10-CM | POA: Diagnosis not present

## 2016-07-06 DIAGNOSIS — I2510 Atherosclerotic heart disease of native coronary artery without angina pectoris: Secondary | ICD-10-CM | POA: Diagnosis not present

## 2016-07-06 DIAGNOSIS — R9439 Abnormal result of other cardiovascular function study: Secondary | ICD-10-CM

## 2016-07-06 DIAGNOSIS — Z79899 Other long term (current) drug therapy: Secondary | ICD-10-CM | POA: Insufficient documentation

## 2016-07-06 DIAGNOSIS — F17210 Nicotine dependence, cigarettes, uncomplicated: Secondary | ICD-10-CM | POA: Diagnosis not present

## 2016-07-06 DIAGNOSIS — E119 Type 2 diabetes mellitus without complications: Secondary | ICD-10-CM | POA: Insufficient documentation

## 2016-07-06 DIAGNOSIS — R55 Syncope and collapse: Secondary | ICD-10-CM | POA: Diagnosis present

## 2016-07-06 HISTORY — PX: CARDIAC CATHETERIZATION: SHX172

## 2016-07-06 LAB — LIPID PANEL
CHOL/HDL RATIO: 6.1 ratio
CHOLESTEROL: 146 mg/dL (ref 0–200)
HDL: 24 mg/dL — ABNORMAL LOW (ref >40)
LDL Cholesterol: 109 mg/dL — ABNORMAL HIGH (ref 0–99)
TRIGLYCERIDES: 65 mg/dL (ref <150)
VLDL: 13 mg/dL (ref 0–40)

## 2016-07-06 SURGERY — LEFT HEART CATH AND CORONARY ANGIOGRAPHY
Anesthesia: Moderate Sedation | Laterality: Left

## 2016-07-06 MED ORDER — SODIUM CHLORIDE 0.9 % WEIGHT BASED INFUSION: 1.0000 mL/kg/h | INTRAVENOUS | Status: DC

## 2016-07-06 MED ORDER — MIDAZOLAM HCL 2 MG/2ML IJ SOLN
INTRAMUSCULAR | Status: AC
  Filled 2016-07-06: qty 2

## 2016-07-06 MED ORDER — ASPIRIN 81 MG PO CHEW
CHEWABLE_TABLET | ORAL | Status: AC
  Filled 2016-07-06: qty 1

## 2016-07-06 MED ORDER — ATORVASTATIN CALCIUM 20 MG PO TABS: 20.0000 mg | ORAL_TABLET | Freq: Every day | ORAL | 5 refills | Status: DC

## 2016-07-06 MED ORDER — IOPAMIDOL (ISOVUE-300) INJECTION 61%
INTRAVENOUS | Status: DC | PRN
  Administered 2016-07-06: 60 mL via INTRA_ARTERIAL

## 2016-07-06 MED ORDER — HEPARIN (PORCINE) IN NACL 2-0.9 UNIT/ML-% IJ SOLN
INTRAMUSCULAR | Status: AC
  Filled 2016-07-06: qty 500

## 2016-07-06 MED ORDER — VERAPAMIL HCL 2.5 MG/ML IV SOLN
INTRAVENOUS | Status: AC
  Filled 2016-07-06: qty 2

## 2016-07-06 MED ORDER — SODIUM CHLORIDE 0.9 % IV SOLN: 250.0000 mL | INTRAVENOUS | Status: DC | PRN

## 2016-07-06 MED ORDER — VERAPAMIL HCL 2.5 MG/ML IV SOLN
INTRAVENOUS | Status: DC | PRN
  Administered 2016-07-06: 2.5 mg via INTRA_ARTERIAL

## 2016-07-06 MED ORDER — FENTANYL CITRATE (PF) 100 MCG/2ML IJ SOLN
INTRAMUSCULAR | Status: AC
  Filled 2016-07-06: qty 2

## 2016-07-06 MED ORDER — SODIUM CHLORIDE 0.9 % WEIGHT BASED INFUSION: 3.0000 mL/kg/h | INTRAVENOUS | Status: DC

## 2016-07-06 MED ORDER — SODIUM CHLORIDE 0.9 % IV SOLN
250.0000 mL | INTRAVENOUS | Status: DC | PRN
  Administered 2016-07-06: 100 mL via INTRAVENOUS

## 2016-07-06 MED ORDER — SODIUM CHLORIDE 0.9 % IV SOLN: INTRAVENOUS | Status: DC

## 2016-07-06 MED ORDER — SODIUM CHLORIDE 0.9% FLUSH: 3.0000 mL | Freq: Two times a day (BID) | INTRAVENOUS | Status: DC

## 2016-07-06 MED ORDER — HEPARIN SODIUM (PORCINE) 1000 UNIT/ML IJ SOLN
INTRAMUSCULAR | Status: DC | PRN
  Administered 2016-07-06: 5000 [IU] via INTRAVENOUS

## 2016-07-06 MED ORDER — SODIUM CHLORIDE 0.9% FLUSH: 3.0000 mL | INTRAVENOUS | Status: DC | PRN

## 2016-07-06 MED ORDER — ASPIRIN 81 MG PO CHEW
81.0000 mg | CHEWABLE_TABLET | ORAL | Status: AC
Start: 2016-07-07 — End: 2016-07-06
  Administered 2016-07-06: 81 mg via ORAL

## 2016-07-06 MED ORDER — MIDAZOLAM HCL 2 MG/2ML IJ SOLN
INTRAMUSCULAR | Status: DC | PRN
  Administered 2016-07-06: 1 mg via INTRAVENOUS

## 2016-07-06 MED ORDER — FENTANYL CITRATE (PF) 100 MCG/2ML IJ SOLN
INTRAMUSCULAR | Status: DC | PRN
  Administered 2016-07-06: 50 ug via INTRAVENOUS

## 2016-07-06 MED ORDER — HEPARIN SODIUM (PORCINE) 1000 UNIT/ML IJ SOLN
INTRAMUSCULAR | Status: AC
  Filled 2016-07-06: qty 1

## 2016-07-06 SURGICAL SUPPLY — 7 items
CATH 5F 110X4 TIG (CATHETERS) ×3 IMPLANT
DEVICE RAD TR BAND REGULAR (VASCULAR PRODUCTS) ×3 IMPLANT
GLIDESHEATH SLEND SS 6F .021 (SHEATH) ×3 IMPLANT
GUIDEWIRE ANGLED .035X260CM (WIRE) ×3 IMPLANT
KIT MANI 3VAL PERCEP (MISCELLANEOUS) ×3 IMPLANT
PACK CARDIAC CATH (CUSTOM PROCEDURE TRAY) ×3 IMPLANT
WIRE SAFE-T 1.5MM-J .035X260CM (WIRE) ×3 IMPLANT

## 2016-07-06 NOTE — Interval H&P Note (Signed)
History and Physical Interval Note:  07/06/2016 8:16 AM  [name]  has presented today for cardiac catheterization, with the diagnosis of syncope and abnormal stress test. The various methods of treatment have been discussed with the patient and family. After consideration of risks, benefits and other options for treatment, the patient has consented to  Procedure(s): Left Heart Cath and Coronary Angiography (Left) as a surgical intervention .  The patient's history has been reviewed, patient examined, no change in status, stable for surgery.  I have reviewed the patient's chart and labs.  Questions were answered to the patient's satisfaction.    Cath Lab Visit (complete for each Cath Lab visit)  Clinical Evaluation Leading to the Procedure:   ACS: No.  Non-ACS:    Anginal Classification: No Symptoms (unexplained syncope)  Anti-ischemic medical therapy: No Therapy  Non-Invasive Test Results: Intermediate-risk stress test findings: cardiac mortality 1-3%/year  Prior CABG: No previous CABG  Lanny Lipkin

## 2016-07-06 NOTE — H&P (View-Only) (Signed)
Cardiology Office Note   Date:  06/23/2016   ID:  Carl, Barnes April 22, 1951, MRN [id number]  Referring Doctor:  Elsie Stain, MD   Cardiologist:   Wende Bushy, MD   Reason for consultation:  Chief Complaint  Patient presents with  . Follow-up    NO CP, SOB OR SWELLING. NO OTHER COMPLAINTS.      History of Present Illness: Carl Barnes is a 65 y.o. male who presents for Follow-up after testing   She was seen previously for Evaluation for episode of loss of consciousness, abnormal EKG. Patient reported an episode of passing out while in the shower. When he woke up that day, he felt well. He went out to get the newspaper. He recalls taking out some chlorine to the swimming pool. He remembers maybe getting a whiff of chlorine. He remembers walking into the shower. He did not have any symptoms prior to falling down to the floor. No chest pain, no shortness of breath, no lightheadedness. The bathroom was a small area and when he fell down the floor, he moved the toilet away from the sink. He did not hit his head anywhere. There was no bleeding or bruising. He did not seek medical evaluation.   Since last visit, he has had no recurrence of passing out. He denies chest pain and shortness of breath. Denies PND, orthopnea, edema.   ROS:  Please see the history of present illness. Aside from mentioned under HPI, all other systems are reviewed and negative.     Past Medical History:  Diagnosis Date  . Cholangitis 12/11   [facility] admission - ERCP for stone removal per GI  . Diabetes mellitus without complication ([place])   . Elevated glucose 10/16 to 06/09/2004   [facility] admission, mild elevated cholesterol  . Hyperlipidemia 12/21/1997  . Poisoning by black widow spider bite 1979  . Renal stones     Past Surgical History:  Procedure Laterality Date  . Cardiolite  06/08/2004   WNL  . Chest CT w/contrast  06/08/04   Negative PE  . CHOLECYSTECTOMY  01/2011   Diseased GB.  [facility]  .  Head MRI  12/99   Normal (R/O acoustic neuroma)     reports that he has been smoking Cigarettes.  He has a 6.25 pack-year smoking history. He has quit using smokeless tobacco. He reports that he drinks about 2.4 - 3.6 oz of alcohol per week . He reports that he does not use drugs.   family history includes Cancer in his mother; Heart disease in his brother and paternal grandfather; Hyperlipidemia in his mother; Stroke in his brother.   Outpatient Medications Prior to Visit  Medication Sig Dispense Refill  . aspirin 81 MG tablet Take 81 mg by mouth daily.    . cetirizine (ZYRTEC) 10 MG tablet Take 10 mg by mouth daily.    . nicotine polacrilex (COMMIT) 2 MG lozenge Take 2 mg by mouth as needed for smoking cessation.     No facility-administered medications prior to visit.      Allergies: Codeine    PHYSICAL EXAM: VS:  BP 110/70 (BP Location: Left Arm, Patient Position: Sitting, Cuff Size: Normal)   Pulse 73   Ht 5\' 11"  (1.803 m)   Wt 221 lb 1.9 oz (100.3 kg)   SpO2 97%   BMI 30.84 kg/m  , Body mass index is 30.84 kg/m. Wt Readings from Last 3 Encounters:  06/23/16 221 lb 1.9 oz (100.3 kg)  06/17/16 222 lb 8 oz (100.9 kg)  06/15/16 224 lb 8 oz (101.8 kg)     GENERAL:  well developed, well nourished, obese, not in acute distress HEENT: normocephalic, pink conjunctivae, anicteric sclerae, no xanthelasma, normal dentition, oropharynx clear NECK:  no neck vein engorgement, JVP normal, no hepatojugular reflux, carotid upstroke brisk and symmetric, no bruit, no thyromegaly, no lymphadenopathy LUNGS:  good respiratory effort, clear to auscultation bilaterally CV:  PMI not displaced, no thrills, no lifts, S1 and S2 within normal limits, no palpable S3 or S4, no murmurs, no rubs, no gallops ABD:  Soft, nontender, nondistended, normoactive bowel sounds, no abdominal aortic bruit, no hepatomegaly, no splenomegaly MS: nontender back, no kyphosis, no scoliosis, no joint deformities EXT:   2+ DP/PT pulses, no edema, no varicosities, no cyanosis, no clubbing SKIN: warm, nondiaphoretic, normal turgor, no ulcers NEUROPSYCH: alert, oriented to person, place, and time, sensory/motor grossly intact, normal mood, appropriate affect  Recent Labs: 05/11/2016: ALT 20; BUN 16; Creatinine, Ser 1.08; Potassium 4.8; Sodium 141 05/18/2016: Hemoglobin 16.1; Platelets 149.0   Lipid Panel    Component Value Date/Time   CHOL 147 05/11/2016 0846   TRIG 207.0 (H) 05/11/2016 0846   HDL 25.30 (L) 05/11/2016 0846   CHOLHDL 6 05/11/2016 0846   VLDL 41.4 (H) 05/11/2016 0846   LDLCALC 106 (H) 02/18/2012 0830   LDLDIRECT 93.0 05/11/2016 0846     Other studies Reviewed:  EKG:  The ekg from 06/15/2016 was personally reviewed by me and it revealed sinus rhythm, 63 BPM. LVH. QRS widening to 160 ms. Abnormal EKG.  Additional studies/ records that were reviewed personally reviewed by me today include: Echo 06/09/2016: Left ventricle: The cavity size was normal. Wall thickness was   increased in a pattern of mild LVH. Systolic function was normal.   The estimated ejection fraction was in the range of 55% to 60%.   Wall motion was normal; there were no regional wall motion   abnormalities. Left ventricular diastolic function parameters   were normal for the patient&'s age. - Mitral valve: There was mild regurgitation.  Carotid ultrasound 06/09/2016: Normal carotid arteries, bilaterally. Patent vertebral arteries with antegrade flow. Normal subclavian arteries, bilaterally.  Nuclear stress test 06/22/2016:  Abnormal, intermediate risk study.  There is a large in size, moderate in severity, partially reversible inferior/inferoseptal defect consistent with ischmia and possible element of scar.  Left ventricular ejection is preserved with basal inferior and inferoseptal hypokinesis.   ASSESSMENT AND PLAN:  Loss of consciousness Abnormal EKG Abnormal stress test  Based on history and  orthostatic vital signs, there is likely be contribution of orthostatic drop in his blood pressure. Advised about taking care and changing positions, doing changing positions slowly.  Risk factors for CAD include age, gender, smoking, diabetes Stresses revealed chronotropic competence. However, it was abnormal with an intermediate risk result. Due to significant risk factors as mentioned above, discussed the option of proceeding with a definitive test of a left heart catheterization. Risks and benefits of cardiac catheterization have been discussed with the patient.  These include less than 1% chance of major complications including but not limited to bleeding, infection, kidney damage, arrhyhthmia, heart attack, stroke, and death.  The patient understands these risks and is willing to proceed.   Diabetes Recent diagnosis. Ideal recommended LDL goal because of this diagnosis would be an LDL of less than 70.  Tobacco use We discussed the importance of smoking cessation and different strategies for quitting.    Current medicines  are reviewed at length with the patient today.  The patient does not have concerns regarding medicines.  Labs/ tests ordered today include:  Orders Placed This Encounter  Procedures  . DG Chest 2 View  . CBC with Differential/Platelet  . Basic metabolic panel  . INR/PT    I had a lengthy and detailed discussion with the patient regarding diagnoses, prognosis, diagnostic options, treatment options , and side effects of medications.   I counseled the patient on importance of lifestyle modification including heart healthy diet, regular physical activity Once heart cath is completed, and smoking cessation.   Disposition:   FU with undersigned after tests   I spent at least 20minutes with the patient today and more than 50% of the time was spent counseling the patient and coordinating care.     Signed, Wende Bushy, MD  06/23/2016 4:21 PM    [place]  This note was generated in part with voice recognition software and I apologize for any typographical errors that were not detected and corrected.

## 2016-07-06 NOTE — Discharge Instructions (Signed)
The drugs you were given will stay in your system until tomorrow, so for the next 24 hours you should not.  Drive an automobile. Make any legal decisions.  Drink any alcoholic beverages.  Today you should start with liquids and gradually work up to solid foods as your are able to tolerate them  Resume your regular medications as prescribed by your doctor.  Avoid aspirin for 24 hours.    Change the Band-Aid or dressing as needed.  After a 2 days no dressing as needed.  Avoid strenuous activity for the remainder of the day.  Please notify your primary physician immediately if you have any unusual bleeding, trouble breathing, fever >100 degrees or pain not relieved by the medication your doctor prescribed for your doctor prescribed for you physician  Keep right wrist elevated on pillow above the heart for today.  Watch right wrist for evidence of bleeding or hematoma.. If bleeding or hematoma noted, hold pressure over the site for at least 15 minutes and notify the physician.  No blending or flexing of the wrist--no lifting for the remainder of the day or for 2 weeks after your procedure. Notify the physician for evidence of infection or if you get a temperature.

## 2016-07-13 ENCOUNTER — Encounter: Payer: Self-pay | Admitting: Cardiology

## 2016-07-13 ENCOUNTER — Ambulatory Visit (INDEPENDENT_AMBULATORY_CARE_PROVIDER_SITE_OTHER): Payer: Medicare Other | Admitting: Cardiology

## 2016-07-13 VITALS — BP 110/76 | HR 60 | Ht 71.0 in | Wt 220.8 lb

## 2016-07-13 DIAGNOSIS — R55 Syncope and collapse: Secondary | ICD-10-CM

## 2016-07-13 DIAGNOSIS — I2510 Atherosclerotic heart disease of native coronary artery without angina pectoris: Secondary | ICD-10-CM

## 2016-07-13 DIAGNOSIS — R9439 Abnormal result of other cardiovascular function study: Secondary | ICD-10-CM | POA: Diagnosis not present

## 2016-07-13 DIAGNOSIS — R9431 Abnormal electrocardiogram [ECG] [EKG]: Secondary | ICD-10-CM | POA: Diagnosis not present

## 2016-07-13 DIAGNOSIS — F17200 Nicotine dependence, unspecified, uncomplicated: Secondary | ICD-10-CM

## 2016-07-13 MED ORDER — PRAVASTATIN SODIUM 40 MG PO TABS
40.0000 mg | ORAL_TABLET | Freq: Every evening | ORAL | 6 refills | Status: DC
Start: 2016-07-13 — End: 2016-09-29

## 2016-07-13 NOTE — Patient Instructions (Addendum)
Medication Instructions:  Your physician has recommended you make the following change in your medication:  1. STOP Atorvastatin 2. START Pravastatin 40 mg at bedtime   Labwork: Your physician recommends that you return for lab work in: 3 months for lipid and liver labs. Make sure to not eat or drink after midnight the night before coming in.    Follow-Up: Your physician wants you to follow-up in: 6 months with Dr. Yvone Neu. You will receive a reminder letter in the mail two months in advance. If you don't receive a letter, please call our office to schedule the follow-up appointment.  It was a pleasure seeing you today here in the office. Please do not hesitate to give [place] a call back if you have any further questions. [place], BSN     Lipid Profile Test Why am I having this test? The lipid profile test gives results that can help predict the likelihood of developing heart disease. The test is also used to monitor treatment for high cholesterol to see if you are reaching your goals. A lipid profile measures the following:  Total cholesterol. Cholesterol is a waxy fat in your blood. If your total cholesterol is elevated, this can increase your risk of coronary heart disease.  High-density lipoprotein (HDL). This is known as the good cholesterol. Having a high level of HDL is good. Your HDL level may be low if you smoke or do not get enough exercise.  Low-density lipoprotein (LDL). This is known as the bad cholesterol and is responsible for the formation of plaque in the arteries. Having a low level of LDL is best.  Cholesterol to HDL ratio. This is calculated by dividing the total cholesterol by the HDL cholesterol. The ratio is used by health care providers for determining your risk of heart disease. A low ratio is best.  Triglycerides. These are a type of fat in the blood responsible for providing energy to your cells. Low levels are best. What kind of sample is  taken? A blood sample is required for this test. It is usually collected by inserting a needle into a vein. How do I prepare for this test? Do not eat or drink anything after midnight on the night before the test or as directed by your health care provider. What are the reference ranges? Reference ranges are considered healthy ranges established after testing a large group of healthy people. Reference ranges may vary among different people, labs, and hospitals. It is your responsibility to obtain your test results. Ask the lab or department performing the test when and how you will get your results. Reference ranges for the lipid profile test are as follows: Total Cholesterol  Adult or elderly: less than 200 mg/dL or less than 5.20 mmol/L (SI units).  Child: 120-200 mg/dL.  Infant: 70-175 mg/dL.  Newborns: 53-135 mg/dL. HDL  Male: greater than 45 mg/dL or greater than 0.75 mmol/L (SI units).  Male: greater than 55 mg/dL or greater than 0.91 mmol/L (SI units). HDL reference values based on risk of heart disease:  For low risk of heart disease:  Male: 60 mg/dL or 1.55 mmol/L.  Male: 70 mg/dL or 1.81 mmol/L.  For moderate risk of heart disease:  Male: 45 mg/dL or 1.17 mmol/L.  Male: 55 mg/dL or 1.42 mmol/L.  For high risk of heart disease:  Male: 25 mg/dL or 0.65 mmol/L.  Male: 35 mg/dL or 0.90 mmol/L. LDL  Adult: less than 130 mg/dL.  Children: less than  110 mg/dL. Cholesterol to HDL Ratio  Reference values based on risk for coronary heart disease:  Risk that is one half average:  Male: 3.4.  Male: 3.3.  Average risk:  Male: 5.0.  Male: 4.4.  Risk that is two times average (moderate risk):  Male: 10.0.  Male: 7.0.  Risk that is three times average (high risk):    Male: 11.0. Triglycerides  Adult or elderly:  Male: 40-160 mg/dL or 0.45-1.81 mmol/L (SI units).  Male: 35-135 mg/dL or 0.40-1.52 mmol/L (SI units).  Children 71-17  years old:  Male: 30-86 mg/dL.  Male: 32-99 mg/dL.  Children 41-51 years old:  Male: 31-108 mg/dL.  Male: 35-114 mg/dL.  Children 58-31 years old:  Male: 36-138 mg/dL.  Male: 41-138 mg/dL.  Children 58-54 years old:  Male: 40-163 mg/dL.  Male: 40-128 mg/dL. Triglycerides should be less than 400 mg/dL even when you are not fasting. What do the results mean? Talk with your health care provider to discuss your results, treatment options, and if necessary, the need for more tests. Talk with your health care provider if you have any questions about your results. Talk with your health care provider to discuss your results, treatment options, and if necessary, the need for more tests. Talk with your health care provider if you have any questions about your results. This information is not intended to replace advice given to you by your health care provider. Make sure you discuss any questions you have with your health care provider. Document Released: 09/02/2004 Document Revised: 04/14/2016 Document Reviewed: 11/29/2013 Elsevier Interactive Patient Education  2017 [employer].

## 2016-07-13 NOTE — Progress Notes (Signed)
Cardiology Office Note   Date:  07/13/2016   ID:  Press, Galeski 06-11-1951, MRN [id number]  Referring Doctor:  Elsie Stain, MD   Cardiologist:   Wende Bushy, MD   Reason for consultation:  Chief Complaint  Patient presents with  . other    3 wk f/u c/o atorvastatin increase blood sugar. Meds reviewed verbally with pt.      History of Present Illness: Carl Barnes is a 65 y.o. male who presents for Follow-up after LHC   Patient has been doing okay. No recurrence of passing out.  He denies chest pain and shortness of breath. Denies PND, orthopnea, edema.   ROS:  Please see the history of present illness. Aside from mentioned under HPI, all other systems are reviewed and negative.     Past Medical History:  Diagnosis Date  . Cholangitis 12/11   [facility] admission - ERCP for stone removal per GI  . Diabetes mellitus without complication ([place])   . Elevated glucose 10/16 to 06/09/2004   [facility] admission, mild elevated cholesterol  . Hyperlipidemia 12/21/1997  . Poisoning by black widow spider bite 1979  . Renal stones     Past Surgical History:  Procedure Laterality Date  . CARDIAC CATHETERIZATION Left 07/06/2016   Procedure: Left Heart Cath and Coronary Angiography;  Surgeon: Nelva Bush, MD;  Location: [facility];  Service: Cardiovascular;  Laterality: Left;  . Cardiolite  06/08/2004   WNL  . Chest CT w/contrast  06/08/04   Negative PE  . CHOLECYSTECTOMY  01/2011   Diseased GB.  [facility]  . Head MRI  12/99   Normal (R/O acoustic neuroma)     reports that he has been smoking Cigarettes.  He has a 8.75 pack-year smoking history. He has quit using smokeless tobacco. He reports that he drinks about 1.2 oz of alcohol per week . He reports that he does not use drugs.   family history includes Cancer in his mother; Heart disease in his brother and paternal grandfather; Hyperlipidemia in his mother; Stroke in his brother.   Outpatient Medications Prior  to Visit  Medication Sig Dispense Refill  . aspirin 81 MG chewable tablet Chew 81 mg by mouth daily.    . cetirizine (ZYRTEC) 10 MG tablet Take 10 mg by mouth daily as needed (for seasonal allergies with lawn mowing).     Marland Kitchen ibuprofen (ADVIL,MOTRIN) 200 MG tablet Take 600 mg by mouth every 8 (eight) hours as needed (for pain.).    Marland Kitchen nicotine polacrilex (NICORETTE) 2 MG gum Take 2 mg by mouth 3 (three) times daily as needed for smoking cessation.    Marland Kitchen atorvastatin (LIPITOR) 20 MG tablet Take 1 tablet (20 mg total) by mouth daily. 30 tablet 5   No facility-administered medications prior to visit.      Allergies: Codeine    PHYSICAL EXAM: VS:  BP 110/76 (BP Location: Left Arm, Patient Position: Sitting, Cuff Size: Large)   Pulse 60   Ht 5\' 11"  (1.803 m)   Wt 220 lb 12 oz (100.1 kg)   BMI 30.79 kg/m  , Body mass index is 30.79 kg/m. Wt Readings from Last 3 Encounters:  07/13/16 220 lb 12 oz (100.1 kg)  07/06/16 223 lb (101.2 kg)  07/01/16 223 lb 8 oz (101.4 kg)     GENERAL:  well developed, well nourished, obese, not in acute distress HEENT: normocephalic, pink conjunctivae, anicteric sclerae, no xanthelasma, normal dentition, oropharynx clear NECK:  no  neck vein engorgement, JVP normal, no hepatojugular reflux, carotid upstroke brisk and symmetric, no bruit, no thyromegaly, no lymphadenopathy LUNGS:  good respiratory effort, clear to auscultation bilaterally CV:  PMI not displaced, no thrills, no lifts, S1 and S2 within normal limits, no palpable S3 or S4, no murmurs, no rubs, no gallops ABD:  Soft, nontender, nondistended, normoactive bowel sounds, no abdominal aortic bruit, no hepatomegaly, no splenomegaly MS: nontender back, no kyphosis, no scoliosis, no joint deformities EXT:  2+ DP/PT pulses, no edema, no varicosities, no cyanosis, no clubbing SKIN: warm, nondiaphoretic, normal turgor, no ulcers NEUROPSYCH: alert, oriented to person, place, and time, sensory/motor grossly  intact, normal mood, appropriate affect  Recent Labs: 05/11/2016: ALT 20 05/18/2016: Hemoglobin 16.1 06/23/2016: BUN 17; Creatinine, Ser 1.20; Platelets 159; Potassium 4.3; Sodium 141   Lipid Panel    Component Value Date/Time   CHOL 146 07/06/2016 1014   TRIG 65 07/06/2016 1014   HDL 24 (L) 07/06/2016 1014   CHOLHDL 6.1 07/06/2016 1014   VLDL 13 07/06/2016 1014   LDLCALC 109 (H) 07/06/2016 1014   LDLDIRECT 93.0 05/11/2016 0846     Other studies Reviewed:  EKG:  The ekg from 06/15/2016 was personally reviewed by me and it revealed sinus rhythm, 63 BPM. LVH. QRS widening to 160 ms. Abnormal EKG.  Additional studies/ records that were reviewed personally reviewed by me today include: Echo 06/09/2016: Left ventricle: The cavity size was normal. Wall thickness was   increased in a pattern of mild LVH. Systolic function was normal.   The estimated ejection fraction was in the range of 55% to 60%.   Wall motion was normal; there were no regional wall motion   abnormalities. Left ventricular diastolic function parameters   were normal for the patient&'s age. - Mitral valve: There was mild regurgitation.  Carotid ultrasound 06/09/2016: Normal carotid arteries, bilaterally. Patent vertebral arteries with antegrade flow. Normal subclavian arteries, bilaterally.  Nuclear stress test 06/22/2016:  Abnormal, intermediate risk study.  There is a large in size, moderate in severity, partially reversible inferior/inferoseptal defect consistent with ischmia and possible element of scar.  Left ventricular ejection is preserved with basal inferior and inferoseptal hypokinesis.  Left heart cath 07/06/2016: 1. Mild to moderate, non-obstructive coronary artery disease, including 40% mid LAD stenosis and serial 30-40% proximal, mid, and distal RCA lesions. 2. Normal left ventricular filling pressure.  ASSESSMENT AND PLAN:  Loss of consciousness Abnormal EKG Abnormal stress test Mild to  moderate coronary artery disease On heart cath  Based on history and orthostatic vital signs, there is likely contribution of orthostatic drop in his blood pressure. Advised about taking care and changing positions, doing changing positions slowly. For coronary artery disease, medical therapy is recommended. Continue aspirin. We will try him on pravastatin 40 mg by mouth daily at bedtime. He thinks that atorvastatin is bring his sugars up. Repeat FLP and LFTs in 3 months time. Unable to tolerate beta blocker at this point with heart rate and blood pressure. Continue risk factor modification.  Diabetes Recent diagnosis. Ideal recommended LDL goal because of this diagnosis would be an LDL of less than 70.  Tobacco use We discussed the importance of smoking cessation and different strategies for quitting.    Current medicines are reviewed at length with the patient today.  The patient does not have concerns regarding medicines.  Labs/ tests ordered today include:  Orders Placed This Encounter  Procedures  . Lipid Profile  . Hepatic function panel  . EKG  12-Lead    I had a lengthy and detailed discussion with the patient regarding diagnoses, prognosis, diagnostic options, treatment options , and side effects of medications.   I counseled the patient on importance of lifestyle modification including heart healthy diet, regular physical activity , and smoking cessation.   Disposition:   FU with undersigned In 6 months   Signed, Wende Bushy, MD  07/13/2016 12:31 PM    [place]  This note was generated in part with voice recognition software and I apologize for any typographical errors that were not detected and corrected.

## 2016-08-09 LAB — HM DIABETES EYE EXAM

## 2016-08-17 ENCOUNTER — Encounter: Payer: Self-pay | Admitting: Family Medicine

## 2016-09-01 ENCOUNTER — Other Ambulatory Visit: Payer: Self-pay

## 2016-09-09 ENCOUNTER — Other Ambulatory Visit: Payer: Self-pay

## 2016-09-10 ENCOUNTER — Other Ambulatory Visit: Payer: Self-pay | Admitting: Family Medicine

## 2016-09-10 DIAGNOSIS — E119 Type 2 diabetes mellitus without complications: Secondary | ICD-10-CM

## 2016-09-13 ENCOUNTER — Other Ambulatory Visit (INDEPENDENT_AMBULATORY_CARE_PROVIDER_SITE_OTHER): Payer: Medicare Other

## 2016-09-13 DIAGNOSIS — E119 Type 2 diabetes mellitus without complications: Secondary | ICD-10-CM

## 2016-09-13 LAB — COMPREHENSIVE METABOLIC PANEL
ALBUMIN: 4.1 g/dL (ref 3.5–5.2)
ALT: 20 U/L (ref 0–53)
AST: 13 U/L (ref 0–37)
Alkaline Phosphatase: 63 U/L (ref 39–117)
BILIRUBIN TOTAL: 0.4 mg/dL (ref 0.2–1.2)
BUN: 17 mg/dL (ref 6–23)
CALCIUM: 9.4 mg/dL (ref 8.4–10.5)
CHLORIDE: 106 meq/L (ref 96–112)
CO2: 29 mEq/L (ref 19–32)
CREATININE: 1.07 mg/dL (ref 0.40–1.50)
GFR: 73.39 mL/min (ref >60.00)
Glucose, Bld: 192 mg/dL — ABNORMAL HIGH (ref 70–99)
Potassium: 5 mEq/L (ref 3.5–5.1)
Sodium: 138 mEq/L (ref 135–145)
Total Protein: 7 g/dL (ref 6.0–8.3)

## 2016-09-13 LAB — MICROALBUMIN / CREATININE URINE RATIO
CREATININE, U: 245.8 mg/dL
Microalb Creat Ratio: 0.4 mg/g (ref 0.0–30.0)
Microalb, Ur: 0.9 mg/dL (ref 0.0–1.9)

## 2016-09-13 LAB — LIPID PANEL
CHOLESTEROL: 122 mg/dL (ref 0–200)
HDL: 28.1 mg/dL — ABNORMAL LOW (ref >39.00)
LDL CALC: 60 mg/dL (ref 0–99)
NonHDL: 94.19
TRIGLYCERIDES: 171 mg/dL — AB (ref 0.0–149.0)
Total CHOL/HDL Ratio: 4
VLDL: 34.2 mg/dL (ref 0.0–40.0)

## 2016-09-13 LAB — HEMOGLOBIN A1C: Hgb A1c MFr Bld: 7.8 % — ABNORMAL HIGH (ref 4.6–6.5)

## 2016-09-14 ENCOUNTER — Ambulatory Visit (INDEPENDENT_AMBULATORY_CARE_PROVIDER_SITE_OTHER): Payer: Medicare Other | Admitting: Family Medicine

## 2016-09-14 ENCOUNTER — Encounter: Payer: Self-pay | Admitting: Family Medicine

## 2016-09-14 VITALS — BP 122/72 | HR 64 | Temp 98.5°F | Wt 220.0 lb

## 2016-09-14 DIAGNOSIS — I2510 Atherosclerotic heart disease of native coronary artery without angina pectoris: Secondary | ICD-10-CM | POA: Diagnosis not present

## 2016-09-14 DIAGNOSIS — E119 Type 2 diabetes mellitus without complications: Secondary | ICD-10-CM

## 2016-09-14 DIAGNOSIS — E785 Hyperlipidemia, unspecified: Secondary | ICD-10-CM | POA: Diagnosis not present

## 2016-09-14 NOTE — Patient Instructions (Signed)
Recheck A1c before a visit in about 3 months.  Take care.  Glad to see you.  Update me as needed.   Thanks for your effort.

## 2016-09-14 NOTE — Assessment & Plan Note (Signed)
Continue work on weight and sugar with diet and exercise.  Recheck in about 3 months.  His A1c may lag, d/w pt.  If continued improvement, may not need meds.  He agees.

## 2016-09-14 NOTE — Assessment & Plan Note (Signed)
Nonobstructive disease on cath 2017.  No CP, SOB, BLE edema.  LDL at goal, d/w pt.   Continue statin.  Continue work on weight and sugar with diet and exercise.

## 2016-09-14 NOTE — Progress Notes (Signed)
Pre visit review using our clinic review tool, if applicable. No additional management support is needed unless otherwise documented below in the visit note. 

## 2016-09-14 NOTE — Progress Notes (Signed)
Diabetes:  No meds Hypoglycemic episodes: no Hyperglycemic episodes:no Feet problems:no Blood Sugars averaging: 150-200 in the AM usually.   eye exam within last year: yes A1c some better.   Rx done for pt at [facility] for meter and 100 strips/lancets with 3RF on both.   Check sugar daily as needed.  Gave hard copy rx to patient.   Elevated Cholesterol: Using medications without problems:yes Muscle aches: no Diet compliance:yes Exercise: encouraged.  Lipids d/w pt.    CAD.  No CP, SOB, BLE edema.  LDL at goal, d/w pt.    He is trying to quit smoking.  D/w pt.  Hopes to quit this summer.    PMH and SH reviewed  Meds, vitals, and allergies reviewed.   ROS: Per HPI unless specifically indicated in ROS section   GEN: nad, alert and oriented HEENT: mucous membranes moist NECK: supple w/o LA CV: rrr. PULM: ctab, no inc wob ABD: soft, +bs EXT: no edema SKIN: no acute rash  Diabetic foot exam: Normal inspection No skin breakdown but dry skin noted on the B soles (recurrent in wintertime for patient) Calluses noted on B 1st toes Normal DP pulses Normal sensation to light touch and monofilament Nails normal

## 2016-09-14 NOTE — Assessment & Plan Note (Signed)
Continue statin.  Continue work on weight and sugar with diet and exercise.  LDL at goal.  Labs d/w pt.

## 2016-09-29 ENCOUNTER — Other Ambulatory Visit: Payer: Self-pay | Admitting: *Deleted

## 2016-09-29 MED ORDER — PRAVASTATIN SODIUM 40 MG PO TABS: 40.0000 mg | ORAL_TABLET | Freq: Every evening | ORAL | 3 refills | Status: DC

## 2016-12-06 ENCOUNTER — Other Ambulatory Visit: Payer: Self-pay | Admitting: Family Medicine

## 2016-12-06 DIAGNOSIS — E119 Type 2 diabetes mellitus without complications: Secondary | ICD-10-CM

## 2016-12-14 ENCOUNTER — Other Ambulatory Visit (INDEPENDENT_AMBULATORY_CARE_PROVIDER_SITE_OTHER): Payer: Medicare Other

## 2016-12-14 DIAGNOSIS — E119 Type 2 diabetes mellitus without complications: Secondary | ICD-10-CM

## 2016-12-14 LAB — HEMOGLOBIN A1C: HEMOGLOBIN A1C: 11 % — AB (ref 4.6–6.5)

## 2016-12-16 ENCOUNTER — Ambulatory Visit (INDEPENDENT_AMBULATORY_CARE_PROVIDER_SITE_OTHER): Payer: Medicare Other | Admitting: Cardiology

## 2016-12-16 ENCOUNTER — Encounter: Payer: Self-pay | Admitting: Cardiology

## 2016-12-16 VITALS — BP 118/80 | HR 65 | Ht 68.0 in | Wt 209.5 lb

## 2016-12-16 DIAGNOSIS — I2510 Atherosclerotic heart disease of native coronary artery without angina pectoris: Secondary | ICD-10-CM | POA: Diagnosis not present

## 2016-12-16 DIAGNOSIS — F17200 Nicotine dependence, unspecified, uncomplicated: Secondary | ICD-10-CM

## 2016-12-16 NOTE — Progress Notes (Signed)
Cardiology Office Note   Date:  12/16/2016   ID:  Barnes, Carl 07/06/1951, MRN [id number]  Referring Doctor:  Elsie Stain, MD   Cardiologist:   Wende Bushy, MD   Reason for consultation:  Chief Complaint  Patient presents with  . other    6 month follow up. Meds reviewed by the pt. verbally. "doing well.'      History of Present Illness: Carl Barnes is a 66 y.o. male who presents for Follow-up for CAD   Since last visit, he has been doing quite well. No CP, SOB. No PND, edema, orthopnea. He has been trying hard to stick to DM diet.   ROS:  Please see the history of present illness. Aside from mentioned under HPI, all other systems are reviewed and negative.    Past Medical History:  Diagnosis Date  . Cholangitis 12/11   [facility] admission - ERCP for stone removal per GI  . Diabetes mellitus without complication ([place])   . Elevated glucose 10/16 to 06/09/2004   [place] admission, mild elevated cholesterol  . Hyperlipidemia 12/21/1997  . Poisoning by black widow spider bite 1979  . Renal stones     Past Surgical History:  Procedure Laterality Date  . CARDIAC CATHETERIZATION Left 07/06/2016   Procedure: Left Heart Cath and Coronary Angiography;  Surgeon: Nelva Bush, MD;  Location: [facility];  Service: Cardiovascular;  Laterality: Left;  . Cardiolite  06/08/2004   WNL  . Chest CT w/contrast  06/08/04   Negative PE  . CHOLECYSTECTOMY  01/2011   Diseased GB.  [facility]  . Head MRI  12/99   Normal (R/O acoustic neuroma)     reports that he has been smoking Cigarettes.  He has a 8.75 pack-year smoking history. He has quit using smokeless tobacco. He reports that he drinks about 1.2 oz of alcohol per week . He reports that he does not use drugs.   family history includes Cancer in his mother; Heart disease in his brother and paternal grandfather; Hyperlipidemia in his mother; Stroke in his brother.   Outpatient Medications Prior to Visit  Medication  Sig Dispense Refill  . aspirin 81 MG chewable tablet Chew 81 mg by mouth daily.    . cetirizine (ZYRTEC) 10 MG tablet Take 10 mg by mouth daily as needed (for seasonal allergies with lawn mowing).     Marland Kitchen ibuprofen (ADVIL,MOTRIN) 200 MG tablet Take 600 mg by mouth every 8 (eight) hours as needed (for pain.).    Marland Kitchen pravastatin (PRAVACHOL) 40 MG tablet Take 1 tablet (40 mg total) by mouth every evening. 90 tablet 3   No facility-administered medications prior to visit.      Allergies: Chantix [varenicline] and Codeine    PHYSICAL EXAM: VS:  BP 118/80 (BP Location: Left Arm, Patient Position: Sitting, Cuff Size: Normal)   Pulse 65   Ht 5\' 8"  (1.727 m)   Wt 209 lb 8 oz (95 kg)   BMI 31.85 kg/m  , Body mass index is 31.85 kg/m. Wt Readings from Last 3 Encounters:  12/16/16 209 lb 8 oz (95 kg)  09/14/16 220 lb (99.8 kg)  07/13/16 220 lb 12 oz (100.1 kg)     PHYSICAL EXAM: VS:  BP 118/80 (BP Location: Left Arm, Patient Position: Sitting, Cuff Size: Normal)   Pulse 65   Ht 5\' 8"  (1.727 m)   Wt 209 lb 8 oz (95 kg)   BMI 31.85 kg/m  , BMI Body  mass index is 31.85 kg/m. GENERAL:  well developed, well nourished, obese, not in acute distress HEENT: normocephalic, pink conjunctivae, anicteric sclerae, no xanthelasma, normal dentition, oropharynx clear NECK:  no neck vein engorgement, JVP normal, no hepatojugular reflux, carotid upstroke brisk and symmetric, no bruit, no thyromegaly, no lymphadenopathy LUNGS:  good respiratory effort, clear to auscultation bilaterally CV:  PMI not displaced, no thrills, no lifts, S1 and S2 within normal limits, no palpable S3 or S4, no murmurs, no rubs, no gallops ABD:  Soft, nontender, nondistended, normoactive bowel sounds, no abdominal aortic bruit, no hepatomegaly, no splenomegaly MS: nontender back, no kyphosis, no scoliosis, no joint deformities EXT:  2+ DP/PT pulses, no edema, no varicosities, no cyanosis, no clubbing SKIN: warm, nondiaphoretic, normal  turgor, no ulcers NEUROPSYCH: alert, oriented to person, place, and time, sensory/motor grossly intact, normal mood, appropriate affect   Recent Labs: 05/18/2016: Hemoglobin 16.1 06/23/2016: Platelets 159 09/13/2016: ALT 20; BUN 17; Creatinine, Ser 1.07; Potassium 5.0; Sodium 138   Lipid Panel    Component Value Date/Time   CHOL 122 09/13/2016 0817   TRIG 171.0 (H) 09/13/2016 0817   HDL 28.10 (L) 09/13/2016 0817   CHOLHDL 4 09/13/2016 0817   VLDL 34.2 09/13/2016 0817   LDLCALC 60 09/13/2016 0817   LDLDIRECT 93.0 05/11/2016 0846     Other studies Reviewed:  EKG:  The ekg from 06/15/2016 was personally reviewed by me and it revealed sinus rhythm, 63 BPM. LVH. QRS widening to 160 ms. Abnormal EKG.  Additional studies/ records that were reviewed personally reviewed by me today include: Echo 06/09/2016: Left ventricle: The cavity size was normal. Wall thickness was   increased in a pattern of mild LVH. Systolic function was normal.   The estimated ejection fraction was in the range of 55% to 60%.   Wall motion was normal; there were no regional wall motion   abnormalities. Left ventricular diastolic function parameters   were normal for the patient&'s age. - Mitral valve: There was mild regurgitation.  Carotid ultrasound 06/09/2016: Normal carotid arteries, bilaterally. Patent vertebral arteries with antegrade flow. Normal subclavian arteries, bilaterally.  Nuclear stress test 06/22/2016:  Abnormal, intermediate risk study.  There is a large in size, moderate in severity, partially reversible inferior/inferoseptal defect consistent with ischmia and possible element of scar.  Left ventricular ejection is preserved with basal inferior and inferoseptal hypokinesis.  Left heart cath 07/06/2016: 1. Mild to moderate, non-obstructive coronary artery disease, including 40% mid LAD stenosis and serial 30-40% proximal, mid, and distal RCA lesions. 2. Normal left ventricular filling  pressure.  ASSESSMENT AND PLAN:   Mild to moderate coronary artery disease On heart cath  Continue medical therapy: ASA, statin.  Unable to start BB as BP is wnl on lower side as well as HR. Continue risk factor modification - DM control ,smoking cessation.   Tobacco use We discussed the importance of smoking cessation and different strategies for quitting.   Current medicines are reviewed at length with the patient today.  The patient does not have concerns regarding medicines.  Labs/ tests ordered today include:  Orders Placed This Encounter  Procedures  . EKG 12-Lead     I counseled the patient on importance of lifestyle modification including heart healthy diet, regular physical activity , and smoking cessation.   Disposition:   FU with Cardiology In 6 months  I spent at least 25 minutes with the patient today and more than 50% of the time was spent counseling the patient and coordinating  care.     Signed, Wende Bushy, MD  12/16/2016 11:01 PM    [place]  This note was generated in part with voice recognition software and I apologize for any typographical errors that were not detected and corrected.

## 2016-12-16 NOTE — Patient Instructions (Signed)
Follow-Up: Your physician wants you to follow-up in: 6 months with Dr. End. You will receive a reminder letter in the mail two months in advance. If you don't receive a letter, please call our office to schedule the follow-up appointment.  It was a pleasure seeing you today here in the office. Please do not hesitate to give us a call back if you have any further questions. [phone]  Scotti Motter A. RN, BSN   

## 2016-12-17 ENCOUNTER — Encounter: Payer: Self-pay | Admitting: Family Medicine

## 2016-12-17 ENCOUNTER — Ambulatory Visit (INDEPENDENT_AMBULATORY_CARE_PROVIDER_SITE_OTHER): Payer: Medicare Other | Admitting: Family Medicine

## 2016-12-17 VITALS — BP 122/78 | HR 65 | Temp 98.7°F | Wt 209.0 lb

## 2016-12-17 DIAGNOSIS — K644 Residual hemorrhoidal skin tags: Secondary | ICD-10-CM

## 2016-12-17 DIAGNOSIS — E119 Type 2 diabetes mellitus without complications: Secondary | ICD-10-CM

## 2016-12-17 LAB — BASIC METABOLIC PANEL
BUN: 13 mg/dL (ref 6–23)
CO2: 27 mEq/L (ref 19–32)
CREATININE: 0.94 mg/dL (ref 0.40–1.50)
Calcium: 9.8 mg/dL (ref 8.4–10.5)
Chloride: 102 mEq/L (ref 96–112)
GFR: 85.16 mL/min (ref >60.00)
GLUCOSE: 323 mg/dL — AB (ref 70–99)
POTASSIUM: 4.8 meq/L (ref 3.5–5.1)
Sodium: 135 mEq/L (ref 135–145)

## 2016-12-17 MED ORDER — METFORMIN HCL 500 MG PO TABS: 500.0000 mg | ORAL_TABLET | Freq: Two times a day (BID) | ORAL | 3 refills | Status: DC

## 2016-12-17 NOTE — Assessment & Plan Note (Signed)
Some better today, no need to intervene at OV, see AVS.  He agrees.

## 2016-12-17 NOTE — Assessment & Plan Note (Signed)
Recheck labs, start metformin, inc PO fluids, likely will need insulin start depending on response to metformin.  He'll update me re: sugar next week.  At this point still okay for outpatient f/u.  See notes on labs.

## 2016-12-17 NOTE — Progress Notes (Signed)
Diabetes:  No meds. Hypoglycemic episodes: no Hyperglycemic episodes: yes Feet problems: no Blood Sugars averaging: 185-304 recently eye exam within last year:yes He has been working on diet.   He reports being generally on a low carb diet.  A1c much higher than prev.  Labs and options d/w pt.   3-4 sigs a day.  Encouraged cessation.    Pain on L buttock, noted in the last few days.  No sx like this prev.  No fevers.  No draining.  No passed blood.  Pain is some better today than yesterday.   Meds, vitals, and allergies reviewed.   ROS: Per HPI unless specifically indicated in ROS section   GEN: nad, alert and oriented HEENT: mucous membranes moist NECK: supple w/o LA CV: rrr. PULM: ctab, no inc wob ABD: soft, +bs EXT: no edema SKIN: no acute rash Rectal exam with ext hemorrhoid noted w/o bleeding.

## 2016-12-17 NOTE — Progress Notes (Signed)
Pre visit review using our clinic review tool, if applicable. No additional management support is needed unless otherwise documented below in the visit note. 

## 2016-12-17 NOTE — Patient Instructions (Signed)
Go to the lab on the way out.  We'll contact you with your lab report. Drink plenty of water, start metformin once a day.  Gradually work up to 2 tabs twice a day if tolerated.  If you need to, then cut back on the dose (if GI upset). Use prepH and update me about your sugar next week. Take care.  Glad to see you.  Plan on recheck in 3 months, but we'll likely be in touch/back and forth over the next few weeks.

## 2016-12-29 IMAGING — RF DG SWALLOWING FUNCTION
1 series · 17 of 24 positions shown · non-contrast
Comparison: None.

CLINICAL DATA: Dysphagia.

EXAM:
MODIFIED BARIUM SWALLOW
TECHNIQUE: Different consistencies of barium were administered orally to the
patient by the Speech Pathologist. Imaging of the pharynx was
performed in the lateral projection.
FLUOROSCOPY TIME:  Radiation Exposure Index (as provided by the
fluoroscopic device):
If the device does not provide the exposure index:
Fluoroscopy Time:  1 minutes, 38 seconds
Number of Acquired Images:  0

[Series 1: run · 20 acquisitions, 17 frames shown]
[im 1/20]
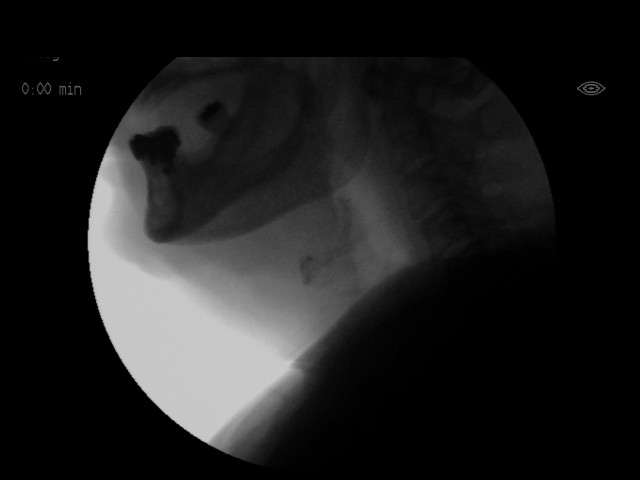
[im 2/20]
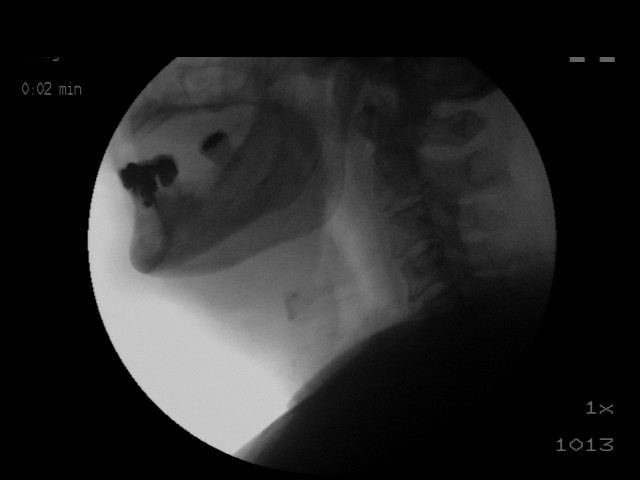
[im 3/20]
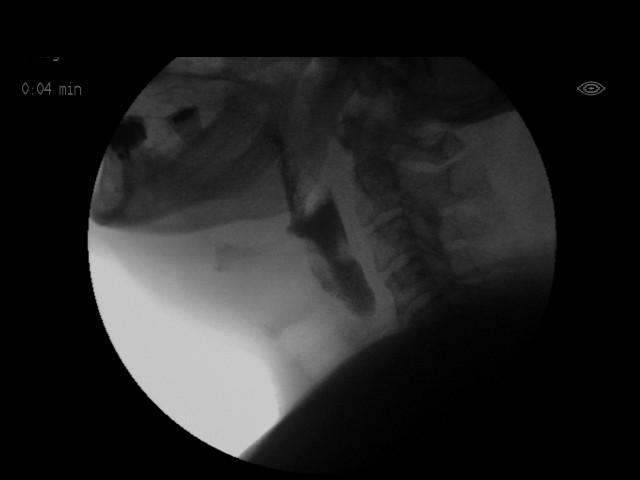
[im 4/20]
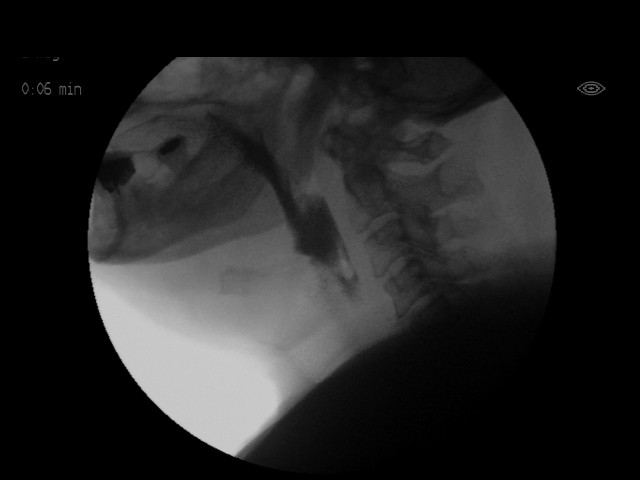
[im 6/20]
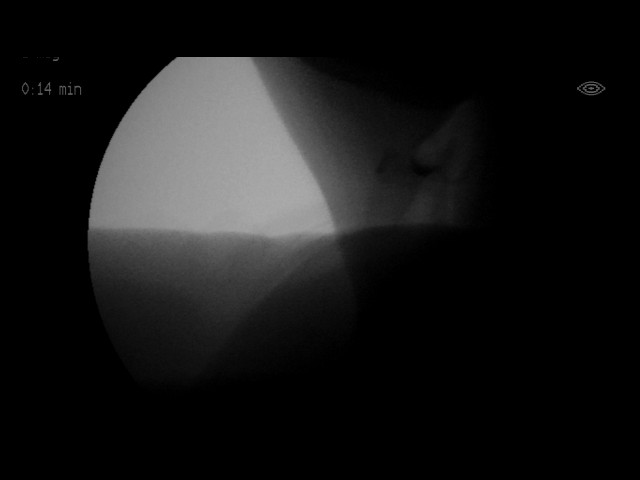
[im 7/20]
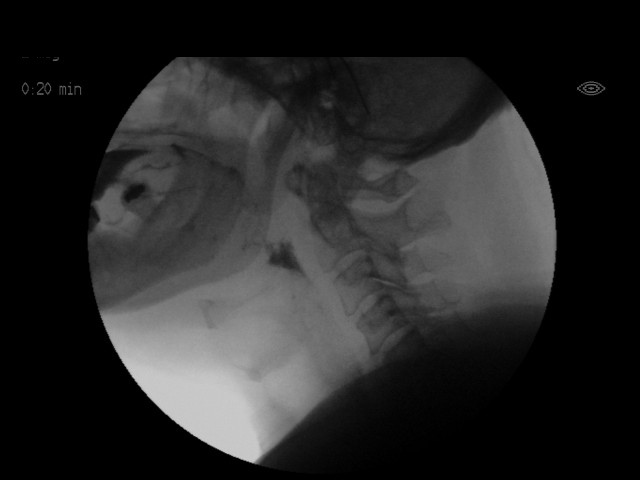
[im 8/20]
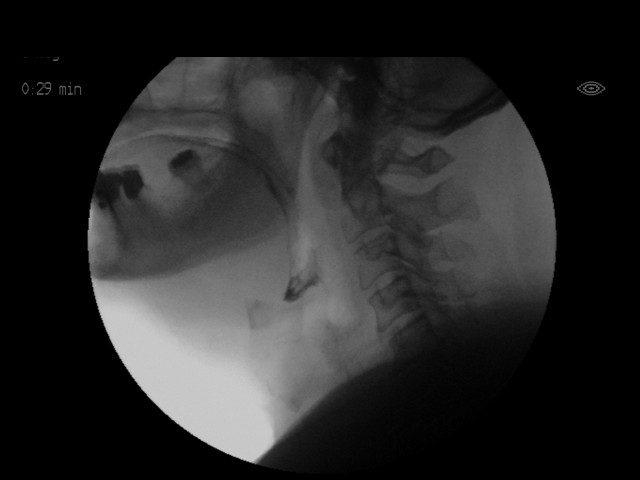
[im 9/20]
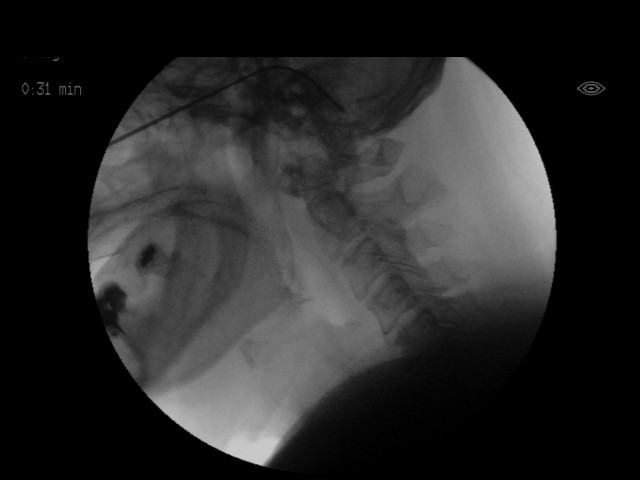
[im 11/20]
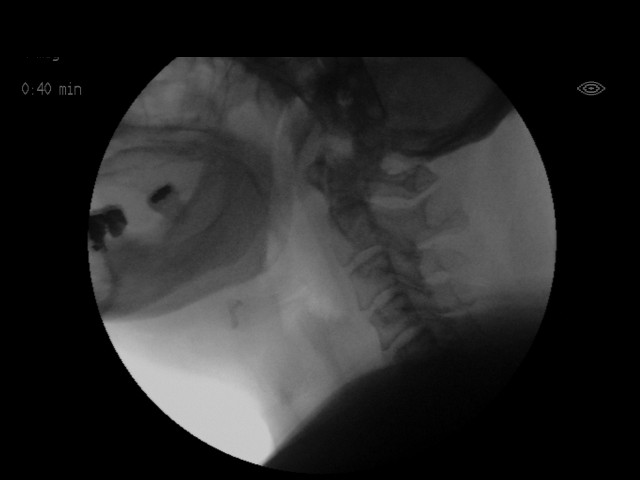
[im 12/20]
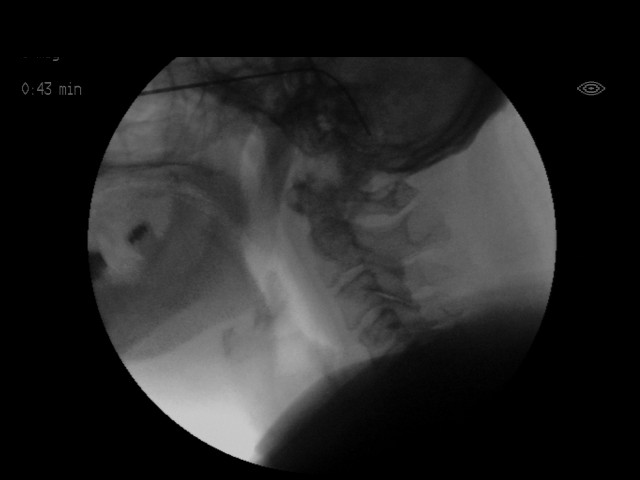
[im 13/20]
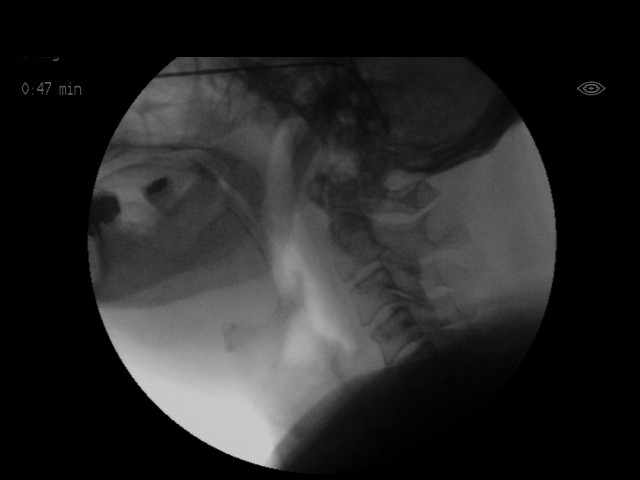
[im 14/20]
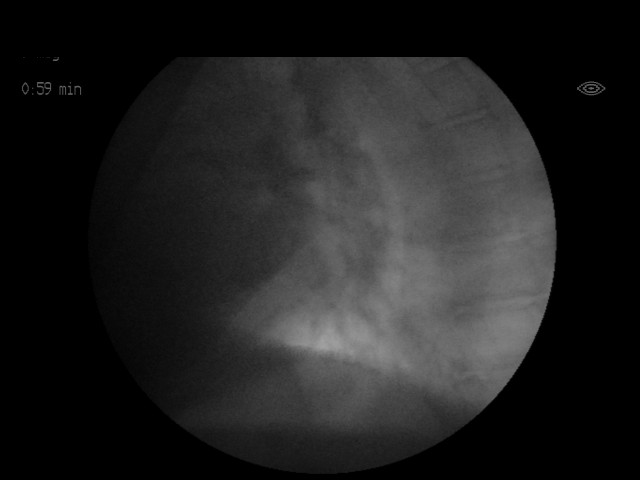
[im 15/20]
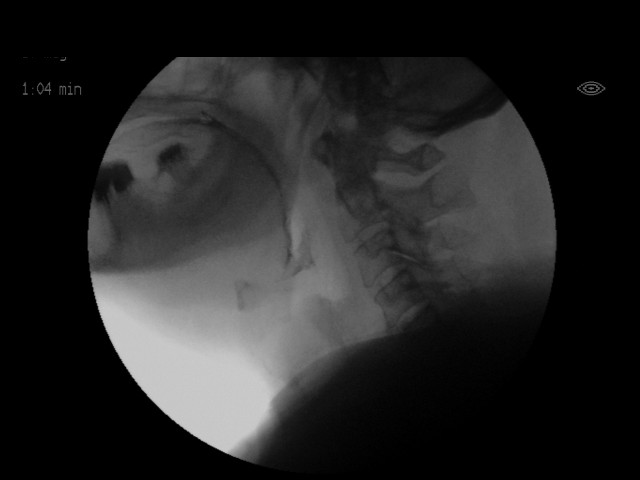
[im 17/20]
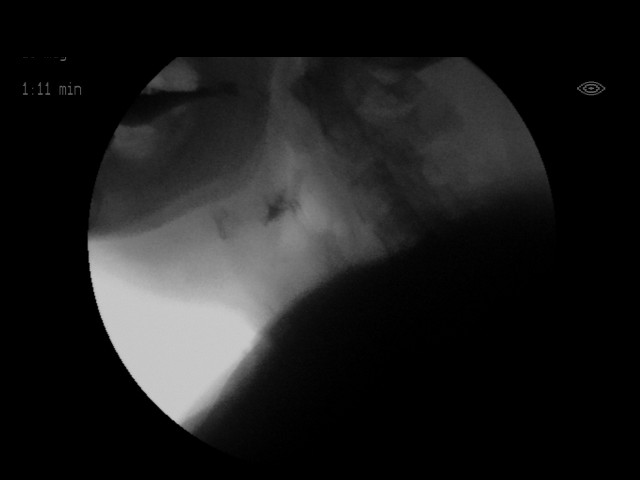
[im 18/20]
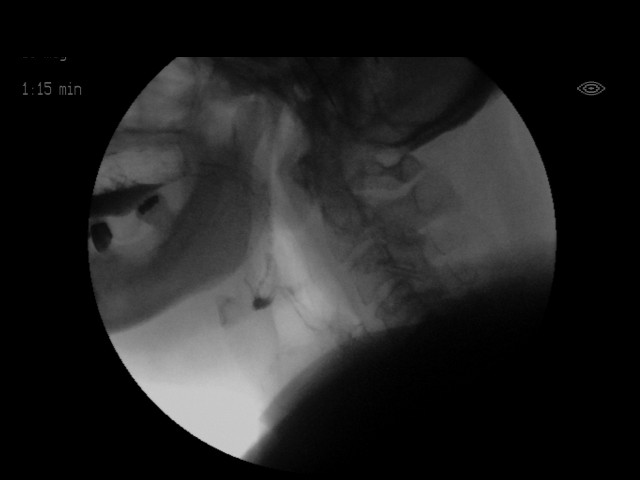
[im 19/20]
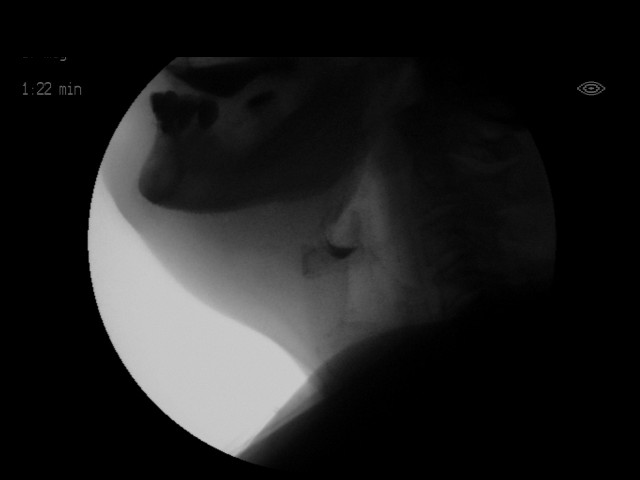
[im 20/20]
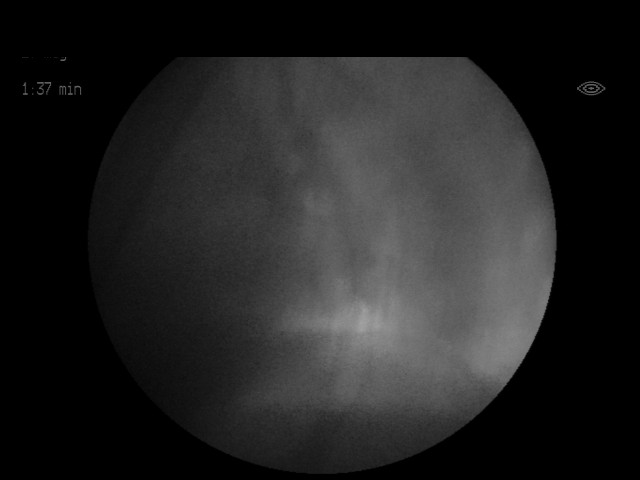

[17 of 24 positions shown; findings below may reference images not displayed]

FINDINGS: Thin liquid- normal with numerous swallows.

Benedetto?Sacko normal with numerous swallows.

Benedetto?Creta with cracker- normal with several swallows.

Barium tablet - the patient initially had difficulty clearing the
pill from the oral cavity. However, the subsequent transit was
normal.
IMPRESSION: Aside from mild difficulty clearing the barium pill from the oral
cavity, today's exam was functionally normal for age.

Please refer to the Speech Pathologists report for complete details
and recommendations.

## 2017-03-18 ENCOUNTER — Ambulatory Visit (INDEPENDENT_AMBULATORY_CARE_PROVIDER_SITE_OTHER): Payer: Medicare Other | Admitting: Family Medicine

## 2017-03-18 ENCOUNTER — Encounter: Payer: Self-pay | Admitting: Family Medicine

## 2017-03-18 VITALS — BP 132/76 | HR 64 | Temp 98.3°F | Wt 214.8 lb

## 2017-03-18 DIAGNOSIS — E119 Type 2 diabetes mellitus without complications: Secondary | ICD-10-CM

## 2017-03-18 LAB — BASIC METABOLIC PANEL
BUN: 14 mg/dL (ref 6–23)
CALCIUM: 9.4 mg/dL (ref 8.4–10.5)
CO2: 24 meq/L (ref 19–32)
Chloride: 107 mEq/L (ref 96–112)
Creatinine, Ser: 0.97 mg/dL (ref 0.40–1.50)
GFR: 82.06 mL/min (ref >60.00)
Glucose, Bld: 143 mg/dL — ABNORMAL HIGH (ref 70–99)
POTASSIUM: 4.1 meq/L (ref 3.5–5.1)
SODIUM: 137 meq/L (ref 135–145)

## 2017-03-18 LAB — HEMOGLOBIN A1C: Hgb A1c MFr Bld: 7.8 % — ABNORMAL HIGH (ref 4.6–6.5)

## 2017-03-18 NOTE — Progress Notes (Signed)
Diabetes:  Using medications without difficulties: tried to take metformin BID but GI upset with that.  Able to get by with 1 pill a day.  Diarrhea on higher dose.   Hypoglycemic episodes: no Hyperglycemic episodes: no Feet problems: no Blood Sugars averaging: ~130 - 140 recently.  He has been working on diet, cut out bread and sweets. More fruits and veggies and salads in the meantime.  Due for labs.  He feels better in general.  Nocturia resolved now with better control of sugar.    He'll call ortho about his L knee pain   Meds, vitals, and allergies reviewed.   ROS: Per HPI unless specifically indicated in ROS section   GEN: nad, alert and oriented HEENT: mucous membranes moist NECK: supple w/o LA CV: rrr. PULM: ctab, no inc wob ABD: soft, +bs EXT: no edema

## 2017-03-18 NOTE — Patient Instructions (Addendum)
Go to the lab on the way out.  We'll contact you with your lab report. Max 500mg  of metformin a day.  Take care.  Glad to see you.  Recheck in about 3 months, labs ahead of time.

## 2017-03-20 NOTE — Assessment & Plan Note (Addendum)
Maximum tolerated dose of metformin is 500 mg a day.  Blood Sugars averaging: ~130 - 140 recently.  He has been working on diet, cut out bread and sweets. More fruits and veggies and salads in the meantime.  Due for labs.  See notes on labs. He feels better in general.  Nocturia resolved now with better control of sugar.   Plan on recheck in about 3-4 months.

## 2017-06-14 ENCOUNTER — Ambulatory Visit: Payer: Medicare Other | Admitting: Internal Medicine

## 2017-06-17 ENCOUNTER — Other Ambulatory Visit (INDEPENDENT_AMBULATORY_CARE_PROVIDER_SITE_OTHER): Payer: Medicare Other

## 2017-06-17 DIAGNOSIS — E119 Type 2 diabetes mellitus without complications: Secondary | ICD-10-CM

## 2017-06-17 DIAGNOSIS — R9439 Abnormal result of other cardiovascular function study: Secondary | ICD-10-CM | POA: Diagnosis not present

## 2017-06-17 LAB — BASIC METABOLIC PANEL
BUN: 13 mg/dL (ref 6–23)
CHLORIDE: 106 meq/L (ref 96–112)
CO2: 28 mEq/L (ref 19–32)
Calcium: 9.3 mg/dL (ref 8.4–10.5)
Creatinine, Ser: 0.88 mg/dL (ref 0.40–1.50)
GFR: 91.75 mL/min (ref >60.00)
Glucose, Bld: 186 mg/dL — ABNORMAL HIGH (ref 70–99)
POTASSIUM: 4.2 meq/L (ref 3.5–5.1)
SODIUM: 139 meq/L (ref 135–145)

## 2017-06-17 NOTE — Addendum Note (Signed)
Addended by: Ellamae Sia on: 06/17/2017 11:07 AM   Modules accepted: Orders

## 2017-06-24 ENCOUNTER — Encounter: Payer: Self-pay | Admitting: Family Medicine

## 2017-06-24 ENCOUNTER — Ambulatory Visit (INDEPENDENT_AMBULATORY_CARE_PROVIDER_SITE_OTHER): Payer: Medicare Other | Admitting: Family Medicine

## 2017-06-24 VITALS — BP 116/74 | HR 64 | Temp 98.4°F | Wt 219.8 lb

## 2017-06-24 DIAGNOSIS — E119 Type 2 diabetes mellitus without complications: Secondary | ICD-10-CM | POA: Diagnosis not present

## 2017-06-24 DIAGNOSIS — Z23 Encounter for immunization: Secondary | ICD-10-CM

## 2017-06-24 LAB — HEMOGLOBIN A1C: Hgb A1c MFr Bld: 7.3 % — ABNORMAL HIGH (ref 4.6–6.5)

## 2017-06-24 NOTE — Progress Notes (Signed)
Diabetes:  Using medications without difficulties: yes Hypoglycemic episodes: no Hyperglycemic episodes: no Feet problems: no Blood Sugars averaging: 120-130s usually, occ higher.   eye exam within last year: yes, due early 2019.   A1c pending.   Diet and exercise d/w pt.   Flu shot today.    He has been taking metformin in the evening. He has been taking 1 pill a day and able to tolerate.  He may be able to take 2 pills a day, see notes on labs.    Smoking cessation d/w pt.  Encouraged.  He failed chantix.  He has used the gum in the past, that seemed to work better.  D/w pt about options.  "I can't get it into my head to quit."  PMH and SH reviewed  Meds, vitals, and allergies reviewed.   ROS: Per HPI unless specifically indicated in ROS section   GEN: nad, alert and oriented HEENT: mucous membranes moist NECK: supple w/o LA CV: rrr. PULM: ctab, no inc wob ABD: soft, +bs EXT: no edema SKIN: no acute rash

## 2017-06-24 NOTE — Patient Instructions (Addendum)
Check with your insurance to see if they will cover the tetanus shot. Go to the lab on the way out.  We'll contact you with your lab report. We may need to get you to try to increase the metformin to twice a day.  Let me see your labs first.  Thanks for your effort.

## 2017-06-26 NOTE — Assessment & Plan Note (Signed)
A1c down to 7.3.  I wouldn't change his meds at his point, would only continue as is and recheck labs prior to visit in about 4 months.  Continue work on diet and exercise.  I thanked patient for his effort.

## 2017-07-12 ENCOUNTER — Ambulatory Visit: Payer: Medicare Other | Admitting: Nurse Practitioner

## 2017-07-12 ENCOUNTER — Encounter: Payer: Self-pay | Admitting: Nurse Practitioner

## 2017-07-12 VITALS — BP 130/68 | HR 66 | Ht 71.0 in | Wt 219.5 lb

## 2017-07-12 DIAGNOSIS — Z720 Tobacco use: Secondary | ICD-10-CM

## 2017-07-12 DIAGNOSIS — E785 Hyperlipidemia, unspecified: Secondary | ICD-10-CM | POA: Diagnosis not present

## 2017-07-12 DIAGNOSIS — I2510 Atherosclerotic heart disease of native coronary artery without angina pectoris: Secondary | ICD-10-CM

## 2017-07-12 MED ORDER — PRAVASTATIN SODIUM 40 MG PO TABS: 40.0000 mg | ORAL_TABLET | Freq: Every evening | ORAL | 3 refills | Status: DC

## 2017-07-12 NOTE — Progress Notes (Signed)
Office Visit    Patient Name: Carl Barnes Date of Encounter: 07/12/2017  Primary Care Provider:  Tonia Ghent, MD Primary Cardiologist:  Formerly A. Yvone Neu, MD   Chief Complaint    66 year old male with a history of diabetes, hyperlipidemia, tobacco abuse, and nonobstructive CAD, who presents for follow-up.  Past Medical History    Past Medical History:  Diagnosis Date  . Cholangitis 12/11   [facility] admission - ERCP for stone removal per GI  . Diabetes mellitus without complication ([place])   . Elevated glucose 10/16 to 06/09/2004   [place] admission, mild elevated cholesterol  . History of echocardiogram    a. 05/2017 Echo: EF 55-60%, mild LVH, no rwma, mild MR.  Marland Kitchen Hyperlipidemia 12/21/1997  . Morbid obesity ([place])   . Non-obstructive CAD (coronary artery disease)    a. 05/2016 Abnl St Test: intermediate risk, large, partially reversible inf/infsept defect consistent w/ ischemia/? scar;  b. 06/2016 Cath: LM nl, LAD 6m, RI nl, LCX nl, OM1/2/3 nl, RCA 40p/m, 30d, RPDA/RPAV nl.  . Poisoning by black widow spider bite 1979  . RBBB   . Renal stones   . Tobacco abuse    Past Surgical History:  Procedure Laterality Date  . Cardiolite  06/08/2004   WNL  . Chest CT w/contrast  06/08/04   Negative PE  . CHOLECYSTECTOMY  01/2011   Diseased GB.  [facility]  . Head MRI  12/99   Normal (R/O acoustic neuroma)  . Left Heart Cath and Coronary Angiography Left 07/06/2016   Performed by Nelva Bush, MD at [facility]    Allergies  Allergies  Allergen Reactions  . Chantix [Varenicline] Other (See Comments)    intolerant  . Codeine Other (See Comments)    REACTION: gi upset  . Metformin And Related Other (See Comments)    Max dose 500mg  a day    History of Present Illness    66 year old ? with the above past medical history including type 2 diabetes mellitus, obesity, tobacco abuse, and hyperlipidemia.  Patient also had an abnormal stress test in October 2017  suggestive of inferior and inferoseptal ischemia.  Subsequent catheterization in early November 2017 revealed minimal nonobstructive CAD.  Since his last visit, he has done quite well.  He remains active, regularly performing yard work, working on his cars, and also hunting without symptoms or limitations.  He denies chest pain, dyspnea, palpitations, PND, orthopnea, dizziness, syncope, edema, or early satiety.  He continues to smoke 5 cigarettes/day.  He realizes that he needs to quit and has previously tried Chantix, patches, gum, and mints.  He thinks he will try gum again.  He has not yet picked a date to quit.  Home Medications    Prior to Admission medications   Medication Sig Start Date End Date Taking? Authorizing Provider  aspirin 81 MG chewable tablet Chew 81 mg by mouth daily.   Yes [provider]  cetirizine (ZYRTEC) 10 MG tablet Take 10 mg by mouth daily as needed (for seasonal allergies with lawn mowing).    Yes [provider]  ibuprofen (ADVIL,MOTRIN) 200 MG tablet Take 600 mg by mouth every 8 (eight) hours as needed (for pain.).   Yes [provider]  metFORMIN (GLUCOPHAGE) 500 MG tablet Take 500 mg by mouth daily with breakfast.   Yes [provider]  pravastatin (PRAVACHOL) 40 MG tablet Take 1 tablet (40 mg total) every evening by mouth. 07/12/17 10/10/17  Rogelia Mire,  NP    Review of Systems    As above, doing well.  He denies chest pain, palpitations, dyspnea, pnd, orthopnea, n, v, dizziness, syncope, edema, weight gain, or early satiety.  All other systems reviewed and are otherwise negative except as noted above.  Physical Exam    VS:  BP 130/68 (BP Location: Left Arm, Patient Position: Sitting, Cuff Size: Large)   Pulse 66   Ht 5\' 11"  (1.803 m)   Wt 219 lb 8 oz (99.6 kg)   BMI 30.61 kg/m  , BMI Body mass index is 30.61 kg/m. GEN: Well nourished, well developed, in no acute distress.  HEENT: normal.  Neck: Supple, no  JVD, carotid bruits, or masses. Cardiac: RRR, no murmurs, rubs, or gallops. No clubbing, cyanosis, edema.  Radials/DP/PT 2+ and equal bilaterally.  Respiratory:  Respirations regular and unlabored, clear to auscultation bilaterally. GI: Soft, nontender, nondistended, BS + x 4. MS: no deformity or atrophy. Skin: warm and dry, no rash. Neuro:  Strength and sensation are intact. Psych: Normal affect.  Accessory Clinical Findings    ECG -regular sinus rhythm, 66, left axis deviation, right bundle branch block, LVH  Assessment & Plan    1.  Nonobstructive coronary artery disease: Status post abnormal stress testing in October 2017 with subsequent catheterization in November 2017 showing a moderate nonobstructive CAD.  He has been doing well without chest pain or dyspnea.  He remains on aspirin and statin therapy.  LDL was 60 in January 2018.  2.  Hyperlipidemia: Remains on Pravachol therapy and we will refill this today.  LDL 60 in January 2018 with normal LFTs at that time.  I will order repeat fasting lipids and LFTs for January 2019.  3.  Ongoing tobacco abuse: Smoking 5 cigarettes/day.  He realizes he needs to quit.  I recommended that he choose a date and circle it on 4.  His calendar as a way of starting that process.  4.  Obesity: Encouraged to increase activity and restrict caloric intake with a goal of wt loss.  5.  Type 2 diabetes mellitus: A1c recently down to 7.3 on November 2.  He was as high as 11 in April 2018.  He remains on metformin and is managed by primary care.  Continue statin therapy.  6.  RBBB:  Previously noted to have IVCD.  Now w/ RBBB.  Asymptomatic.  No evidence of other conduction dzs.  7.  Disposition: Follow-up fasting lipids and LFTs in January 2019.  Otherwise follow-up in 1 year or sooner if necessary.  Murray Hodgkins, NP 07/12/2017, 8:27 AM

## 2017-07-12 NOTE — Patient Instructions (Addendum)
Medication Instructions:  Your physician recommends that you continue on your current medications as directed. Please refer to the Current Medication list given to you today.   Labwork: Your physician recommends that you return for lab work in: January 2019. - Please go to the [facility]. You will check in at the front desk to the right as you walk into the atrium. Valet Parking is offered if needed.  - YOU WILL NEED TO BE FASTING. Please do not have anything to eat or drink after midnight. - No appointment needed.   Testing/Procedures: none  Follow-Up: Your physician wants you to follow-up in: 12 MONTHS WITH DR END. You will receive a reminder letter in the mail two months in advance. If you don't receive a letter, please call our office to schedule the follow-up appointment.   If you need a refill on your cardiac medications before your next appointment, please call your pharmacy.

## 2017-08-24 IMAGING — CR DG CHEST 2V
1 series · 2 of 2 positions shown · non-contrast
Comparison: Chest radiograph October 31, 2008

CLINICAL DATA: Preoperative evaluation for cardiac catheterization.
History of diabetes and smoking.

EXAM:
CHEST  2 VIEW

[Series 1: dg chest 2 view · 0.14mm/px · 2 of 2 slices shown]
[im 1/2]
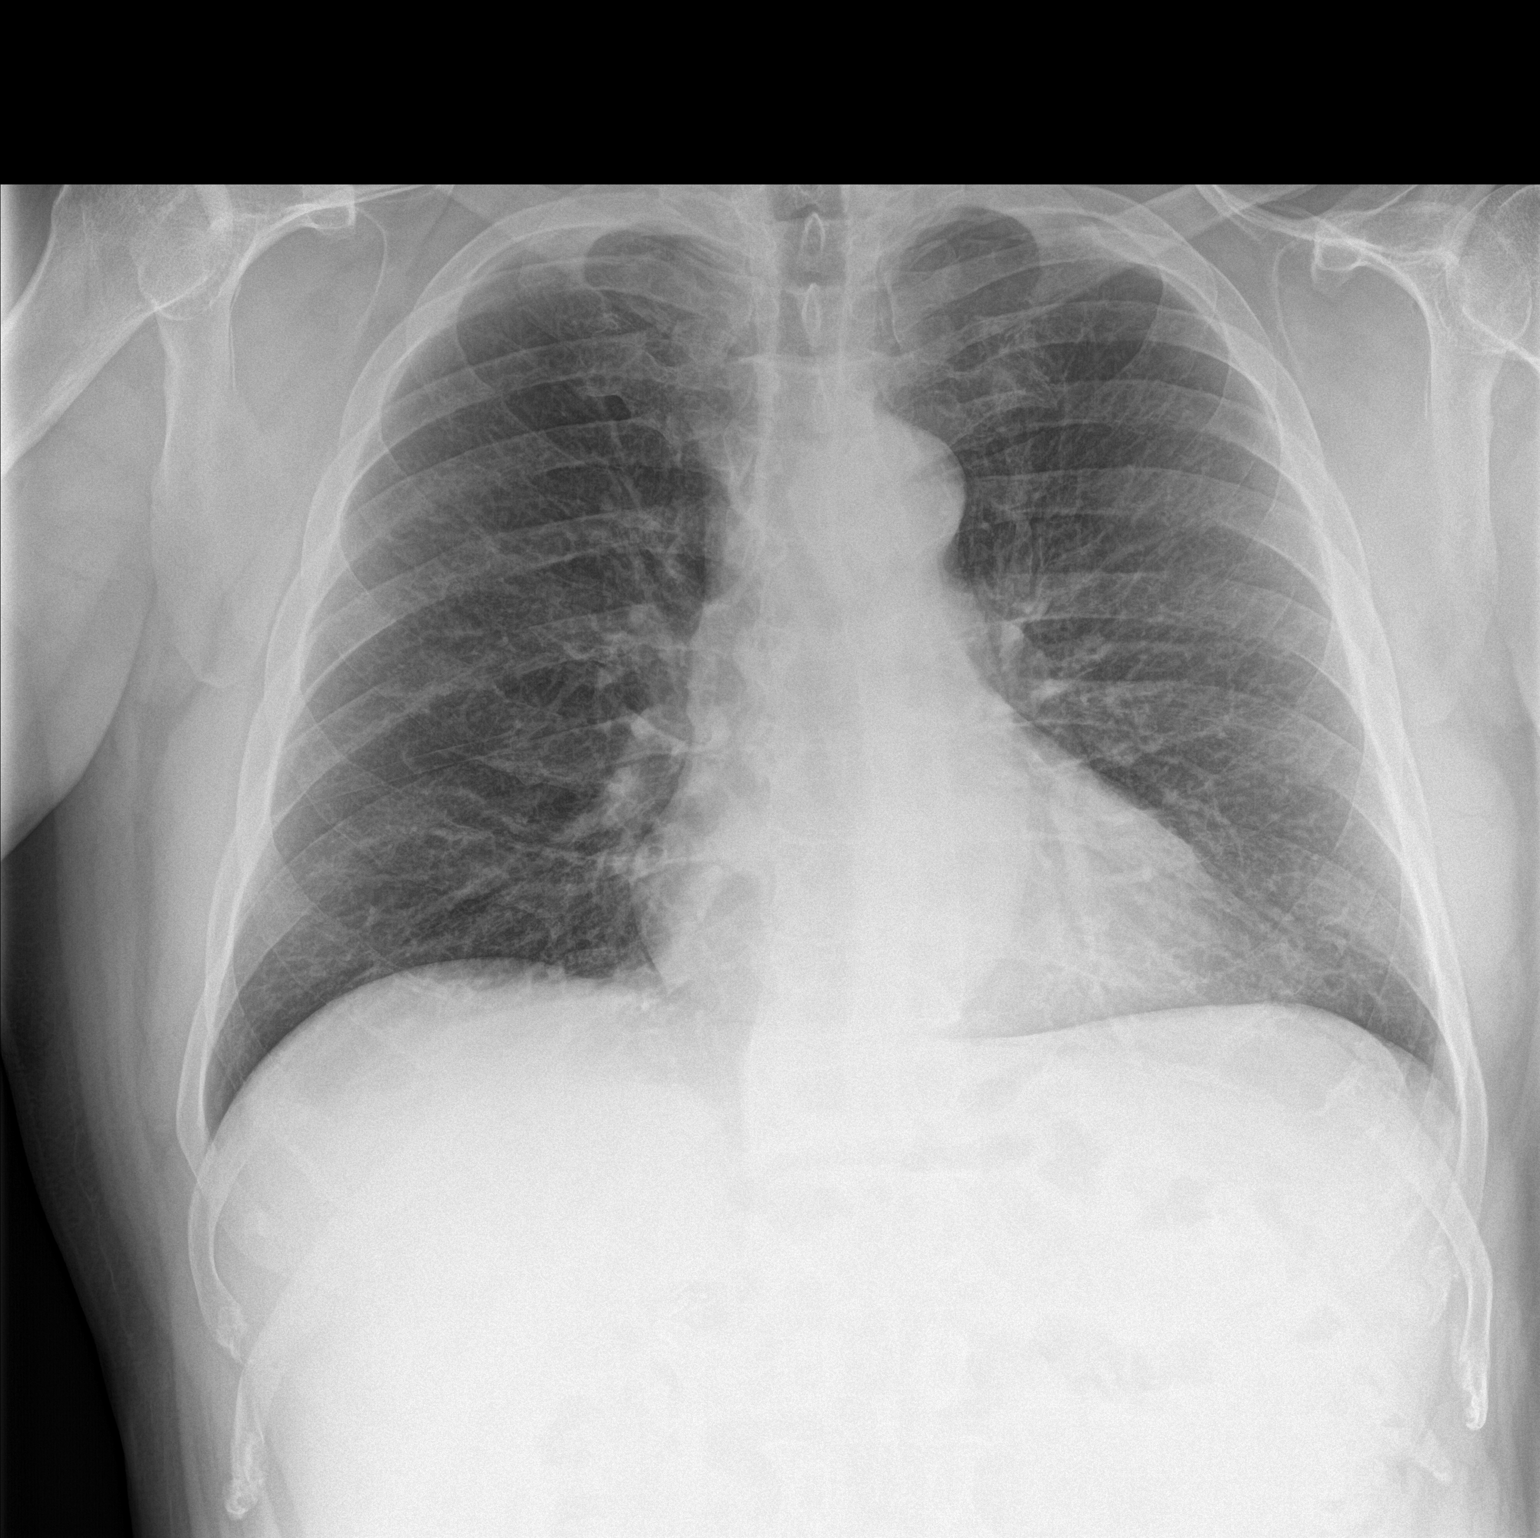
[im 2/2]
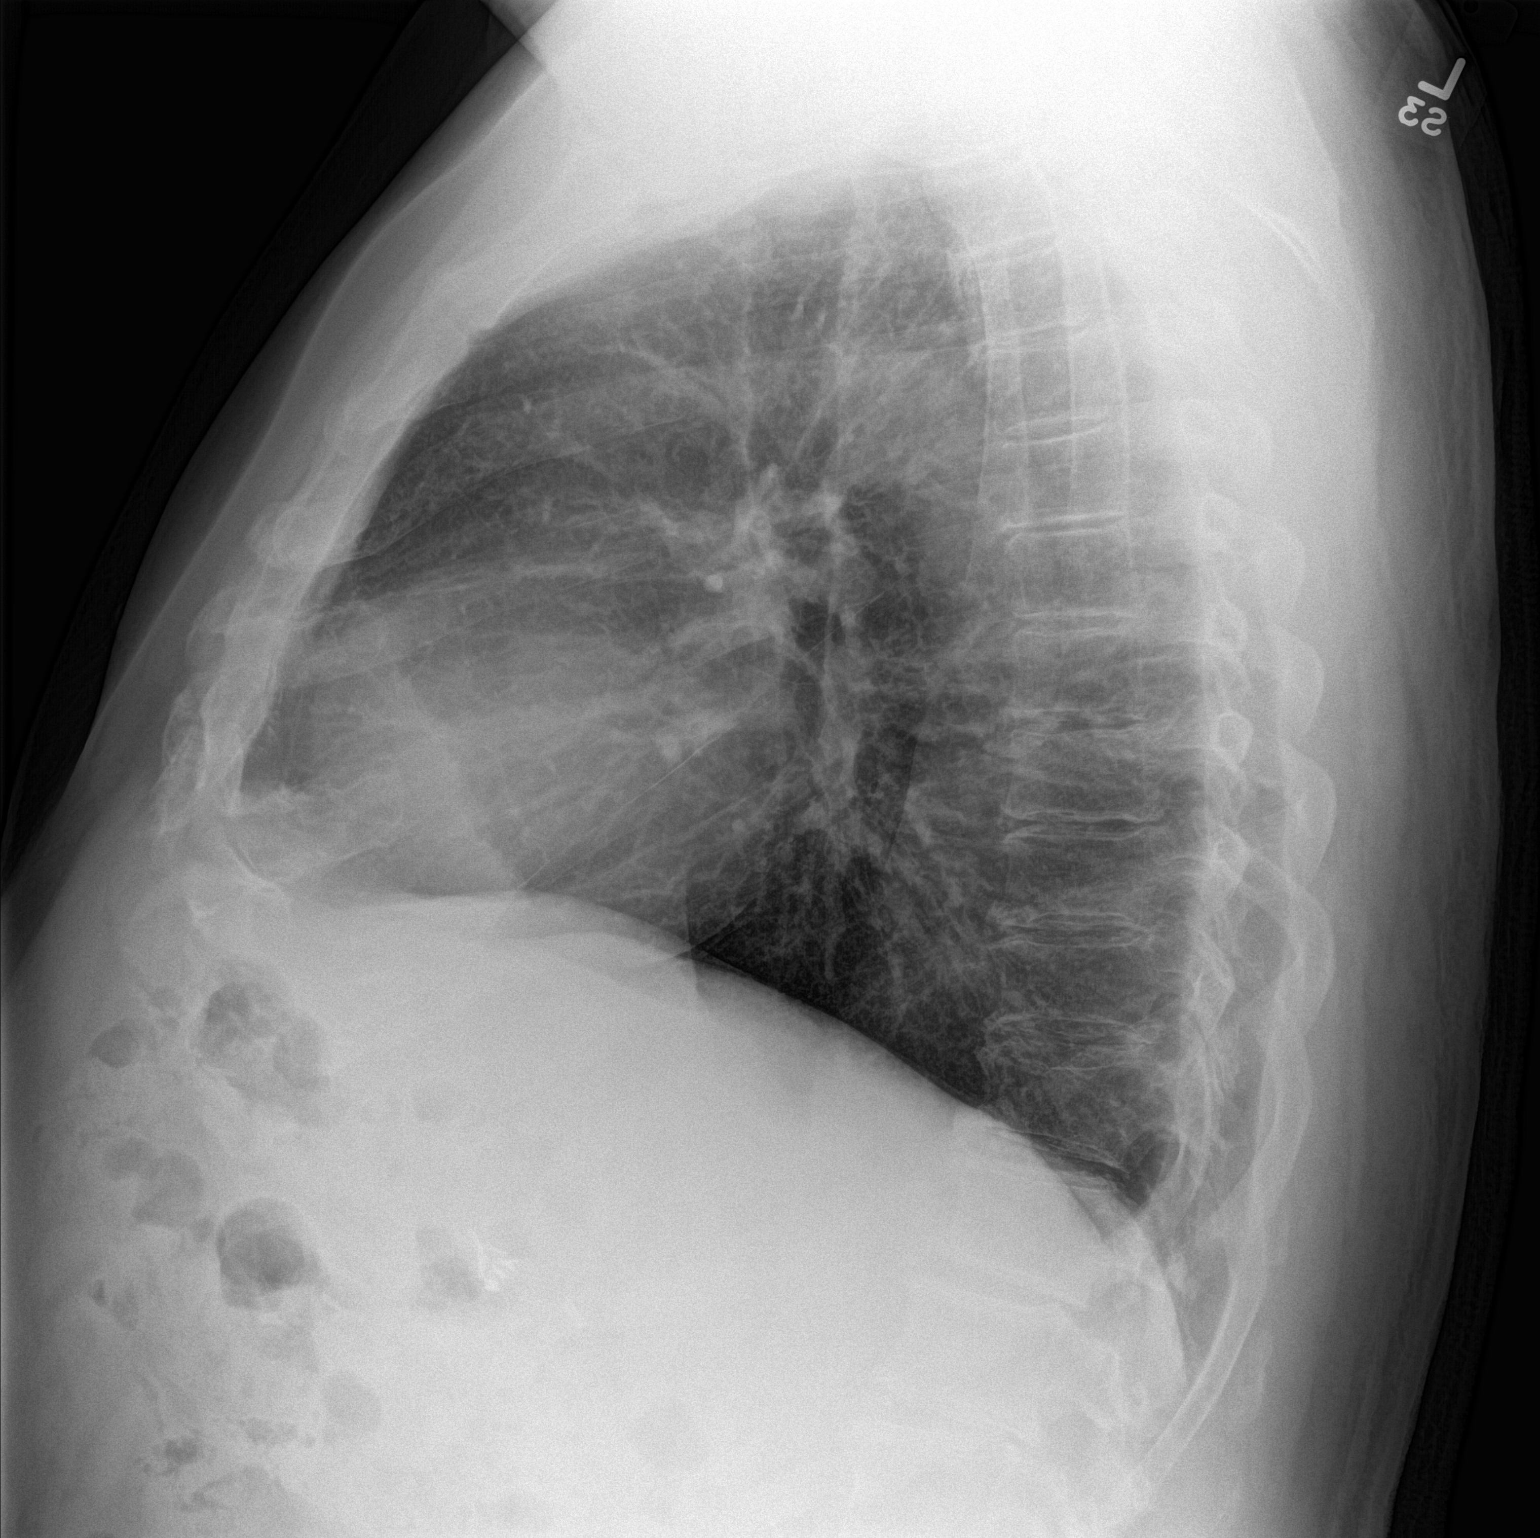

[2 of 2 positions shown; findings below may reference images not displayed]

FINDINGS: Cardiomediastinal silhouette is normal. Mildly tortuous aorta
associated with hypertension. Mild calcific atherosclerosis of the
aortic arch. Mild chronic bronchitic changes. No pleural effusions
or focal consolidations. Trachea projects midline and there is no
pneumothorax. Soft tissue planes and included osseous structures are
non-suspicious. Surgical clips in the included right abdomen
compatible with cholecystectomy.
IMPRESSION: Chronic bronchitic changes, no acute cardiopulmonary process.

## 2017-09-08 HISTORY — PX: CATARACT EXTRACTION W/ INTRAOCULAR LENS IMPLANT: SHX1309

## 2017-10-13 HISTORY — PX: CATARACT EXTRACTION W/ INTRAOCULAR LENS IMPLANT: SHX1309

## 2017-10-17 ENCOUNTER — Other Ambulatory Visit: Payer: Self-pay | Admitting: Family Medicine

## 2017-10-17 DIAGNOSIS — E119 Type 2 diabetes mellitus without complications: Secondary | ICD-10-CM

## 2017-10-18 ENCOUNTER — Other Ambulatory Visit: Payer: Medicare Other

## 2017-10-18 ENCOUNTER — Ambulatory Visit (INDEPENDENT_AMBULATORY_CARE_PROVIDER_SITE_OTHER): Payer: Medicare Other

## 2017-10-18 ENCOUNTER — Other Ambulatory Visit: Payer: Self-pay

## 2017-10-18 VITALS — BP 118/78 | HR 68 | Temp 97.6°F | Ht 68.25 in | Wt 203.5 lb

## 2017-10-18 DIAGNOSIS — E119 Type 2 diabetes mellitus without complications: Secondary | ICD-10-CM | POA: Diagnosis not present

## 2017-10-18 DIAGNOSIS — Z0000 Encounter for general adult medical examination without abnormal findings: Secondary | ICD-10-CM | POA: Diagnosis not present

## 2017-10-18 LAB — COMPREHENSIVE METABOLIC PANEL
ALT: 21 U/L (ref 0–53)
AST: 15 U/L (ref 0–37)
Albumin: 4.1 g/dL (ref 3.5–5.2)
Alkaline Phosphatase: 71 U/L (ref 39–117)
BILIRUBIN TOTAL: 0.6 mg/dL (ref 0.2–1.2)
BUN: 14 mg/dL (ref 6–23)
CO2: 28 meq/L (ref 19–32)
CREATININE: 1.02 mg/dL (ref 0.40–1.50)
Calcium: 10 mg/dL (ref 8.4–10.5)
Chloride: 103 mEq/L (ref 96–112)
GFR: 77.3 mL/min (ref >60.00)
GLUCOSE: 384 mg/dL — AB (ref 70–99)
Potassium: 4.8 mEq/L (ref 3.5–5.1)
SODIUM: 138 meq/L (ref 135–145)
Total Protein: 7.1 g/dL (ref 6.0–8.3)

## 2017-10-18 LAB — LIPID PANEL
Cholesterol: 121 mg/dL (ref 0–200)
HDL: 26.5 mg/dL — ABNORMAL LOW (ref >39.00)
NONHDL: 94.85
Total CHOL/HDL Ratio: 5
Triglycerides: 308 mg/dL — ABNORMAL HIGH (ref 0.0–149.0)
VLDL: 61.6 mg/dL — ABNORMAL HIGH (ref 0.0–40.0)

## 2017-10-18 LAB — HEMOGLOBIN A1C: Hgb A1c MFr Bld: 11.5 % — ABNORMAL HIGH (ref 4.6–6.5)

## 2017-10-18 LAB — MICROALBUMIN / CREATININE URINE RATIO
CREATININE, U: 116.8 mg/dL
MICROALB UR: 0.8 mg/dL (ref 0.0–1.9)
Microalb Creat Ratio: 0.7 mg/g (ref 0.0–30.0)

## 2017-10-18 LAB — LDL CHOLESTEROL, DIRECT: Direct LDL: 57 mg/dL

## 2017-10-18 NOTE — Progress Notes (Signed)
Subjective:   Carl Barnes is a 67 y.o. male who presents for an Initial Medicare Annual Wellness Visit.  Review of Systems  N/A Cardiac Risk Factors include: advanced age (>78men, >48 women);male gender;diabetes mellitus;dyslipidemia;obesity (BMI >30kg/m2)    Objective:    Today's Vitals   10/18/17 0901  BP: 118/78  Pulse: 68  Temp: 97.6 F (36.4 C)  TempSrc: Oral  SpO2: 97%  Weight: 203 lb 8 oz (92.3 kg)  Height: 5' 8.25" (1.734 m)  PainSc: 2    Body mass index is 30.72 kg/m.  Advanced Directives 10/18/2017 07/06/2016 06/08/2016  Does Patient Have a Medical Advance Directive? No No No  Would patient like information on creating a medical advance directive? Yes (MAU/Ambulatory/Procedural Areas - Information given) - No - patient declined information    Current Medications (verified) Outpatient Encounter Medications as of 10/18/2017  Medication Sig  . aspirin 81 MG chewable tablet Chew 81 mg by mouth daily.  . cetirizine (ZYRTEC) 10 MG tablet Take 10 mg by mouth daily as needed (for seasonal allergies with lawn mowing).   Marland Kitchen ibuprofen (ADVIL,MOTRIN) 200 MG tablet Take 600 mg by mouth every 8 (eight) hours as needed (for pain.).  Marland Kitchen metFORMIN (GLUCOPHAGE) 500 MG tablet Take 500 mg by mouth daily with breakfast.  . pravastatin (PRAVACHOL) 40 MG tablet Take 1 tablet (40 mg total) every evening by mouth.   No facility-administered encounter medications on file as of 10/18/2017.     Allergies (verified) Chantix [varenicline]; Codeine; and Metformin and related   History: Past Medical History:  Diagnosis Date  . Cholangitis 12/11   [facility] admission - ERCP for stone removal per GI  . Diabetes mellitus without complication ([place])   . Elevated glucose 10/16 to 06/09/2004   [place] admission, mild elevated cholesterol  . History of echocardiogram    a. 05/2017 Echo: EF 55-60%, mild LVH, no rwma, mild MR.  Marland Kitchen Hyperlipidemia 12/21/1997  . Morbid obesity ([place])   . Non-obstructive  CAD (coronary artery disease)    a. 05/2016 Abnl St Test: intermediate risk, large, partially reversible inf/infsept defect consistent w/ ischemia/? scar;  b. 06/2016 Cath: LM nl, LAD 21m, RI nl, LCX nl, OM1/2/3 nl, RCA 40p/m, 30d, RPDA/RPAV nl.  . Poisoning by black widow spider bite 1979  . RBBB   . Renal stones   . Tobacco abuse    Past Surgical History:  Procedure Laterality Date  . CARDIAC CATHETERIZATION Left 07/06/2016   Procedure: Left Heart Cath and Coronary Angiography;  Surgeon: Nelva Bush, MD;  Location: [facility];  Service: Cardiovascular;  Laterality: Left;  . Cardiolite  06/08/2004   WNL  . CATARACT EXTRACTION W/ INTRAOCULAR LENS IMPLANT Right 10/13/2017   Dr. Satira Sark  . CATARACT EXTRACTION W/ INTRAOCULAR LENS IMPLANT Left 09/08/2017   Dr.Tanner  . Chest CT w/contrast  06/08/04   Negative PE  . CHOLECYSTECTOMY  01/2011   Diseased GB.  [facility]  . Head MRI  12/99   Normal (R/O acoustic neuroma)   Family History  Problem Relation Age of Onset  . Cancer Mother        Pancreatic  . Hyperlipidemia Mother   . Stroke Brother   . Heart disease Brother   . Heart disease Paternal Grandfather        MI  . Diabetes Neg Hx   . Alcohol abuse Neg Hx   . Drug abuse Neg Hx   . Depression Neg Hx   . Colon cancer Neg  Hx   . Prostate cancer Neg Hx    Social History   Socioeconomic History  . Marital status: Married    Spouse name: None  . Number of children: 1  . Years of education: None  . Highest education level: None  Social Needs  . Financial resource strain: None  . Food insecurity - worry: None  . Food insecurity - inability: None  . Transportation needs - medical: None  . Transportation needs - non-medical: None  Occupational History  . Occupation: PLANT Buyer, retail: UNEMPLOYED    Comment: Electronics engineer  Tobacco Use  . Smoking status: Former Smoker    Packs/day: 0.25    Years: 35.00    Pack years: 8.75    Types: Cigarettes     Last attempt to quit: 10/10/2017    Years since quitting: 0.0  . Smokeless tobacco: Never Used  Substance and Sexual Activity  . Alcohol use: Yes    Alcohol/week: 1.2 oz    Types: 2 Shots of liquor per week    Comment: socially  . Drug use: No  . Sexual activity: None  Other Topics Concern  . 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 bring in CBG monitor from home?: No  How often do you need to have someone help you when you read instructions, pamphlets, or other written materials from your doctor or pharmacy?: 1 - Never What is the last grade level you completed in school?: 12th grade + 1 yr college  Interpreter Needed?: No  Comments: pt lives with spouse Information entered by :: LPinson, LPN  Activities of Daily Living In your present state of health, do you have any difficulty performing the following activities: 10/18/2017  Hearing? N  Vision? N  Difficulty concentrating or making decisions? N  Walking or climbing stairs? N  Dressing or bathing? N  Doing errands, shopping? N  Preparing Food and eating ? N  Using the Toilet? N  In the past six months, have you accidently leaked urine? N  Do you have problems with loss of bowel control? N  Managing your Medications? N  Managing your Finances? N  Housekeeping or managing your Housekeeping? N  Some recent data might be hidden     Immunizations and Health Maintenance Immunization History  Administered Date(s) Administered  . Influenza,inj,Quad  PF,6+ Mos 06/24/2017  . Influenza-Unspecified 05/12/2016  . Td 08/23/1996   There are no preventive care reminders to display for this patient.  Patient Care Team: Tonia Ghent, MD as PCP - General (Family Medicine)  Indicate any recent Medical Services you may have received from other than [facility] providers in the past year (date may be approximate).    Assessment:   This is a routine wellness examination for Carl Barnes.  Hearing/Vision screen  Hearing Screening   125Hz  250Hz  500Hz  1000Hz  2000Hz  3000Hz  4000Hz  6000Hz  8000Hz   Right ear:   40 40 40  0    Left ear:  40 40 40  0    Vision Screening Comments: Last vision exam in Feb 2019 with Dr. Satira Sark; recent cataract surgery in both eyes  Dietary issues and exercise activities discussed: Current Exercise Habits: The patient has a physically strenous job, but has no regular exercise apart from work., Exercise limited by: None identified  Goals    . DIET - INCREASE LEAN PROTEINS     Starting 10/18/2017, I will continue to eat lean proteins, fruits, and vegetables in an effort to continue losing weight.       Depression Screen PHQ 2/9 Scores 10/18/2017 06/08/2016 05/18/2016  PHQ - 2 Score 0 0 0  PHQ- 9 Score 0 - -    Fall Risk Fall Risk  10/18/2017 07/01/2016 06/24/2016 06/17/2016 06/08/2016  Falls in the past year? No (No Data) Yes Yes Yes  Comment - none since previous visit no falls since last visit 06/17/16 no falls since last week's visit on 10/17 -  Number falls in past yr: - - - - 2 or more  Comment - - - - "passing out" - last time 3 months ago in shower  Injury with Fall? - - - - No  Risk for fall due to : - - - - History of fall(s)  Follow up - - - - Falls prevention discussed;Education provided  Comment - - - - "may be heart related"   Cognitive Function: MMSE - Mini Mental State Exam 10/18/2017  Orientation to time 5  Orientation to Place 5  Registration 3  Attention/ Calculation 0  Recall 3  Language- name 2  objects 0  Language- repeat 1  Language- follow 3 step command 3  Language- read & follow direction 0  Write a sentence 0  Copy design 0  Total score 20     PLEASE NOTE: A Mini-Cog screen was completed. Maximum score is 20. A value of 0 denotes this part of [place] MMSE was not completed or the patient failed this part of the Mini-Cog screening.   Mini-Cog Screening Orientation to Time - Max 5 pts Orientation to Place - Max 5 pts Registration - Max 3 pts Recall - Max 3 pts Language Repeat - Max 1 pts Language Follow 3 Step Command - Max 3 pts     Screening Tests Health Maintenance  Topic Date Due  . FOOT EXAM  10/24/2017 (Originally 09/14/2017)  . TETANUS/TDAP  10/18/2018 (Originally 08/23/2006)  . PNA vac Low Risk Adult (1 of 2 - PCV13) 10/18/2018 (Originally 04/05/2015)  . HEMOGLOBIN A1C  04/17/2018  . OPHTHALMOLOGY EXAM  10/13/2018  . URINE MICROALBUMIN  10/18/2018  . COLONOSCOPY  04/11/2022  . INFLUENZA VACCINE  Completed  . Hepatitis C Screening  Completed       Plan:     I have personally reviewed, addressed, and noted the following in the patient's chart:  A. Medical and social history B. Use of alcohol, tobacco or illicit drugs  C. Current medications and supplements D. Functional ability and status E.  Nutritional status F.  Physical activity G. Advance directives H. List of other physicians I.  Hospitalizations, surgeries, and ER visits in previous 12 months J.  [place] to include hearing, vision, cognitive, depression L. Referrals and appointments - none  In addition, I have reviewed and discussed with patient certain preventive protocols, quality metrics, and best practice recommendations. A written personalized care plan for preventive services as well as general preventive health recommendations were provided to patient.  See  attached scanned questionnaire for additional information.   Signed,   Lindell Noe, MHA, BS, LPN [facility]

## 2017-10-18 NOTE — Progress Notes (Signed)
PCP notes:   Health maintenance:  Foot exam - PCP please address at next appt Tetanus vaccine - postponed/insurance PCV13 - postponed until issue with left eye is resolved  Abnormal screenings:   None  Patient concerns:   Left eye is red, painful, and swollen. Pt encouraged to contact Dr. Mellissa Kohut. Pt advised he has future appt with Dr. Satira Sark.  Sleep management - Pt reports he has concerns with having a restful sleep at night.  Nurse concerns:  None  Next PCP appt:   10/24/17 @ 0815  I reviewed health advisor's note, was available for consultation on the day of service listed in this note, and agree with documentation and plan. Elsie Stain, MD.

## 2017-10-18 NOTE — Progress Notes (Signed)
Pre visit review using our clinic review tool, if applicable. No additional management support is needed unless otherwise documented below in the visit note. 

## 2017-10-18 NOTE — Patient Instructions (Signed)
Carl Barnes , Thank you for taking time to come for your Medicare Wellness Visit. I appreciate your ongoing commitment to your health goals. Please review the following plan we discussed and let me know if I can assist you in the future.   These are the goals we discussed: Goals    . DIET - INCREASE LEAN PROTEINS     Starting 10/18/2017, I will continue to eat lean proteins, fruits, and vegetables in an effort to continue losing weight.        This is a list of the screening recommended for you and due dates:  Health Maintenance  Topic Date Due  . Complete foot exam   10/24/2017*  . Tetanus Vaccine  10/18/2018*  . Pneumonia vaccines (1 of 2 - PCV13) 10/18/2018*  . Hemoglobin A1C  04/17/2018  . Eye exam for diabetics  10/13/2018  . Urine Protein Check  10/18/2018  . Colon Cancer Screening  04/11/2022  . Flu Shot  Completed  .  Hepatitis C: One time screening is recommended by Center for Disease Control  (CDC) for  adults born from [birth year] through 1966.   Completed  *Topic was postponed. The date shown is not the original due date.   Preventive Care for Adults  A healthy lifestyle and preventive care can promote health and wellness. Preventive health guidelines for adults include the following key practices.  . A routine yearly physical is a good way to check with your health care provider about your health and preventive screening. It is a chance to share any concerns and updates on your health and to receive a thorough exam.  . Visit your dentist for a routine exam and preventive care every 6 months. Brush your teeth twice a day and floss once a day. Good oral hygiene prevents tooth decay and gum disease.  . The frequency of eye exams is based on your age, health, family medical history, use  of contact lenses, and other factors. Follow your health care provider's recommendations for frequency of eye exams.  . Eat a healthy diet. Foods like vegetables, fruits, whole grains, low-fat  dairy products, and lean protein foods contain the nutrients you need without too many calories. Decrease your intake of foods high in solid fats, added sugars, and salt. Eat the right amount of calories for you. Get information about a proper diet from your health care provider, if necessary.  . Regular physical exercise is one of the most important things you can do for your health. Most adults should get at least 150 minutes of moderate-intensity exercise (any activity that increases your heart rate and causes you to sweat) each week. In addition, most adults need muscle-strengthening exercises on 2 or more days a week.  Silver Sneakers may be a benefit available to you. To determine eligibility, you may visit the website: www.silversneakers.com or contact program at [phone]) [phone] Mon-Fri between 8AM-8PM.   . Maintain a healthy weight. The body mass index (BMI) is a screening tool to identify possible weight problems. It provides an estimate of body fat based on height and weight. Your health care provider can find your BMI and can help you achieve or maintain a healthy weight.   For adults 20 years and older: ? A BMI below 18.5 is considered underweight. ? A BMI of 18.5 to 24.9 is normal. ? A BMI of 25 to 29.9 is considered overweight. ? A BMI of 30 and above is considered obese.   . Maintain normal blood lipids  and cholesterol levels by exercising and minimizing your intake of saturated fat. Eat a balanced diet with plenty of fruit and vegetables. Blood tests for lipids and cholesterol should begin at age 40 and be repeated every 5 years. If your lipid or cholesterol levels are high, you are over 50, or you are at high risk for heart disease, you may need your cholesterol levels checked more frequently. Ongoing high lipid and cholesterol levels should be treated with medicines if diet and exercise are not working.  . If you smoke, find out from your health care provider how to quit. If you do  not use tobacco, please do not start.  . If you choose to drink alcohol, please do not consume more than 2 drinks per day. One drink is considered to be 12 ounces (355 mL) of beer, 5 ounces (148 mL) of wine, or 1.5 ounces (44 mL) of liquor.  . If you are 72-8 years old, ask your health care provider if you should take aspirin to prevent strokes.  . Use sunscreen. Apply sunscreen liberally and repeatedly throughout the day. You should seek shade when your shadow is shorter than you. Protect yourself by wearing long sleeves, pants, a wide-brimmed hat, and sunglasses year round, whenever you are outdoors.  . Once a month, do a whole body skin exam, using a mirror to look at the skin on your back. Tell your health care provider of new moles, moles that have irregular borders, moles that are larger than a pencil eraser, or moles that have changed in shape or color.

## 2017-10-24 ENCOUNTER — Encounter: Payer: Self-pay | Admitting: Family Medicine

## 2017-10-24 ENCOUNTER — Ambulatory Visit (INDEPENDENT_AMBULATORY_CARE_PROVIDER_SITE_OTHER): Payer: Medicare Other | Admitting: Family Medicine

## 2017-10-24 VITALS — BP 120/78 | HR 70 | Temp 97.6°F | Ht 68.5 in | Wt 208.5 lb

## 2017-10-24 DIAGNOSIS — E119 Type 2 diabetes mellitus without complications: Secondary | ICD-10-CM | POA: Diagnosis not present

## 2017-10-24 MED ORDER — METFORMIN HCL 500 MG PO TABS: 500.0000 mg | ORAL_TABLET | Freq: Two times a day (BID) | ORAL | Status: DC

## 2017-10-24 MED ORDER — PEN NEEDLES 31G X 5 MM MISC: 1.0000 | Freq: Every day | 3 refills | Status: DC

## 2017-10-24 MED ORDER — BASAGLAR KWIKPEN 100 UNIT/ML ~~LOC~~ SOPN: PEN_INJECTOR | SUBCUTANEOUS | 12 refills | Status: DC

## 2017-10-24 NOTE — Patient Instructions (Signed)
Start basaglar with 5 units at night.  Ask the pharmacy if levemir/lantus will be cheaper.   Update me as needed about that.  Add 1 unit per night until your morning sugar is less than 150.  When 100-149, no change in dose.  If below 100, then decrease 1 unit.  Update me in a few days.   I want to see you back next week at a visit.  Take care.  Glad to see you.

## 2017-10-24 NOTE — Progress Notes (Signed)
Diabetes:  Using medications without difficulties: he increased his metformin to 1 tab BID.   Hypoglycemic episodes:no Hyperglycemic episodes:yes, up to 384 this AM (ate some cake last night) Feet problems:no Blood Sugars averaging: 300s.   eye exam within last year: yes He can tolerate 1000mg  of metformin with some diarrhea.    He isn't getting as much exercise but his diet had generally been good over the last 3 months. He had gotten his A1c lower with diet and metformin.    Other issues tabled due to DM2 discussion.   Labs d/w pt.   PMH and SH reviewed  Meds, vitals, and allergies reviewed.   ROS: Per HPI unless specifically indicated in ROS section   GEN: nad, alert and oriented HEENT: mucous membranes moist NECK: supple w/o LA CV: rrr. PULM: ctab, no inc wob ABD: soft, +bs EXT: no edema

## 2017-10-25 ENCOUNTER — Ambulatory Visit: Payer: Self-pay | Admitting: Family Medicine

## 2017-10-25 NOTE — Assessment & Plan Note (Signed)
Other issues tabled due to DM2 discussion.  He can tolerate 1000mg  of metformin with some diarrhea.   He isn't getting as much exercise but his diet had generally been good over the last 3 months. He had gotten his A1c lower with diet and metformin but it is much higher now.   Discussed with patient about options.  Reasonable to start daily insulin injection.  Discussed with patient about routine instructions.  He thinks he can safely inject insulin as prescribed at home.  He is going to check on coverage and see if he can get the insulin prescription filled.  I sent him home with instructions for injection after demonstrating routine aseptic technique with a model here in the clinic. Start basaglar with 5 units at night.  See AVS.  Add 1 unit per night until morning sugar is less than 150.  When 100-149, no change in dose.  If below 100, then decrease 1 unit.  Update me in a few days.   I want to see pt back next week at a visit.  He agrees.  >25 minutes spent in face to face time with patient, >50% spent in counselling or coordination of care.

## 2017-10-26 ENCOUNTER — Telehealth: Payer: Self-pay

## 2017-10-26 MED ORDER — INSULIN GLARGINE 100 UNIT/ML SOLOSTAR PEN: PEN_INJECTOR | SUBCUTANEOUS | 99 refills | Status: DC

## 2017-10-26 NOTE — Telephone Encounter (Signed)
rx changed to lantus.  All of the prev instructions and dosing still apply.  rx sent.  Thanks.

## 2017-10-26 NOTE — Telephone Encounter (Signed)
Received a PA for [employer]. Note on CoverMyMeds:  The following alternatives are the preferred alternatives: Lantus Pen, Levemir Pen, Toujeo, Tresiba.   Please change to appropriate alternative (Lantus).

## 2017-10-26 NOTE — Telephone Encounter (Signed)
Patient notified by telephone of the change and verbalized understanding.

## 2017-10-31 ENCOUNTER — Ambulatory Visit: Payer: Medicare Other | Admitting: Family Medicine

## 2017-10-31 ENCOUNTER — Encounter: Payer: Self-pay | Admitting: Family Medicine

## 2017-10-31 VITALS — BP 126/70 | HR 71 | Temp 98.5°F | Wt 209.5 lb

## 2017-10-31 DIAGNOSIS — Z23 Encounter for immunization: Secondary | ICD-10-CM | POA: Diagnosis not present

## 2017-10-31 DIAGNOSIS — E119 Type 2 diabetes mellitus without complications: Secondary | ICD-10-CM

## 2017-10-31 DIAGNOSIS — Z794 Long term (current) use of insulin: Secondary | ICD-10-CM

## 2017-10-31 MED ORDER — INSULIN GLARGINE 100 UNIT/ML SOLOSTAR PEN: PEN_INJECTOR | SUBCUTANEOUS | Status: DC

## 2017-10-31 NOTE — Progress Notes (Signed)
He was able to get his insulin.  He has been using the pens in the meantime.  He has a sharps container.  D/w pt about aseptic injections.    He started on 5 units a day, had been increasing his dose based on sugars in the meantime.  285 this AM.  He is up to 8 units last night.  D/w pt about continued increase in dose.  He has less polyuria and polydipsia.  He feels better.    No pain with injection.  He is getting used to the process.    L eye is back to baseline and he has eye clinic f/u.    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  He gets about 6 hours of sleep at night, with best sleep from 2-6 AM.  He can get by as is.  D/w pt.    Prostate cancer screening and PSA options (with potential risks and benefits of testing vs not testing) were discussed along with recent recs/guidelines.  He declined testing PSA at this point.  Meds, vitals, and allergies reviewed.   ROS: Per HPI unless specifically indicated in ROS section   GEN: nad, alert and oriented HEENT: mucous membranes moist NECK: supple w/o LA CV: rrr PULM: ctab, no inc wob ABD: soft, +bs EXT: no edema  Diabetic foot exam: Normal inspection No skin breakdown Callus noted on R 1st toe.   Normal DP pulses Normal sensation to light touch and monofilament Nails thickened

## 2017-10-31 NOTE — Patient Instructions (Addendum)
Keep adding 1 unit a day until AM sugar below 150.  Keep going as is.   Update me in about 1-2 weeks, sooner as needed.  Let me know about your dose and readings.  Okay to send a note via mychart.   Pneumonia shot today.  Recheck in about 3 with A1c ahead of time.  Thanks for your effort.   Take care.  Glad to see you.

## 2017-11-01 NOTE — Assessment & Plan Note (Signed)
Pneumonia vaccine done today.  Discussed with him about continuing his insulin dose increase.  As long as he has fasting hyperglycemia he will continue to add 1 unit/day.  He will update me in about 1-2 weeks.  He can update me sooner if needed.  We talked about routine cautions.  Plan on recheck in about 3 months.  He understood the plan.

## 2017-11-23 ENCOUNTER — Other Ambulatory Visit: Payer: Self-pay | Admitting: Family Medicine

## 2017-11-23 NOTE — Telephone Encounter (Signed)
Electronic refill request Last office visit 10/31/17 Request is not on medication list Please confirm directions on test strips

## 2017-11-23 NOTE — Telephone Encounter (Signed)
Sent.  Thanks.  Sig updated.

## 2018-01-22 ENCOUNTER — Other Ambulatory Visit: Payer: Self-pay | Admitting: Family Medicine

## 2018-01-22 DIAGNOSIS — E119 Type 2 diabetes mellitus without complications: Secondary | ICD-10-CM

## 2018-01-22 DIAGNOSIS — Z794 Long term (current) use of insulin: Principal | ICD-10-CM

## 2018-01-27 ENCOUNTER — Other Ambulatory Visit (INDEPENDENT_AMBULATORY_CARE_PROVIDER_SITE_OTHER): Payer: Medicare Other

## 2018-01-27 DIAGNOSIS — E119 Type 2 diabetes mellitus without complications: Secondary | ICD-10-CM | POA: Diagnosis not present

## 2018-01-27 DIAGNOSIS — Z794 Long term (current) use of insulin: Secondary | ICD-10-CM

## 2018-01-27 LAB — HEMOGLOBIN A1C: Hgb A1c MFr Bld: 8 % — ABNORMAL HIGH (ref 4.6–6.5)

## 2018-01-31 ENCOUNTER — Ambulatory Visit: Payer: Medicare Other | Admitting: Family Medicine

## 2018-01-31 ENCOUNTER — Encounter: Payer: Self-pay | Admitting: Family Medicine

## 2018-01-31 DIAGNOSIS — E119 Type 2 diabetes mellitus without complications: Secondary | ICD-10-CM

## 2018-01-31 DIAGNOSIS — Z794 Long term (current) use of insulin: Secondary | ICD-10-CM

## 2018-01-31 MED ORDER — INSULIN GLARGINE 100 UNIT/ML SOLOSTAR PEN: PEN_INJECTOR | SUBCUTANEOUS | Status: DC

## 2018-01-31 NOTE — Patient Instructions (Signed)
Recheck in about 3 months.  The only lab you need to have done for your next diabetic visit is an A1c.  We can do this with a fingerstick test at the office visit.  You do not need a lab visit ahead of time for this.  It does not matter if you are fasting when the lab is done.   Adjust your insulin as needed.  Take care.  Glad to see you.  Thanks for your effort.

## 2018-01-31 NOTE — Progress Notes (Signed)
Diabetes:  Using medications without difficulties: up to 30+ units initially then gradually worked down to 7-10 units per day Still on metformin.   Hypoglycemic episodes:no Hyperglycemic episodes:no Feet problems:no Blood Sugars averaging: ~130s eye exam within last year:yes A1c is down to 8, sig improvement.   He is more active.  He realizes that diet and activity affect his sugar.    R ankle pain if prolonged sitting then getting up.  Then better with movement.    He has a trip to [place] planned for his grandson's graduation.    PMH and SH reviewed Meds, vitals, and allergies reviewed.   ROS: Per HPI unless specifically indicated in ROS section   GEN: nad, alert and oriented HEENT: mucous membranes moist NECK: supple w/o LA CV: rrr. PULM: ctab, no inc wob ABD: soft, +bs EXT: no edema

## 2018-01-31 NOTE — Assessment & Plan Note (Signed)
Recheck in about 3 months.  Adjust insulin as needed, see orders.  He agrees.  I thanked him for his effort.  A1c clearly better.

## 2018-05-05 ENCOUNTER — Ambulatory Visit: Payer: Medicare Other | Admitting: Family Medicine

## 2018-05-05 ENCOUNTER — Encounter: Payer: Self-pay | Admitting: Family Medicine

## 2018-05-05 VITALS — BP 138/74 | HR 69 | Temp 98.2°F | Ht 68.5 in | Wt 214.5 lb

## 2018-05-05 DIAGNOSIS — Z794 Long term (current) use of insulin: Secondary | ICD-10-CM

## 2018-05-05 DIAGNOSIS — Z23 Encounter for immunization: Secondary | ICD-10-CM | POA: Diagnosis not present

## 2018-05-05 DIAGNOSIS — E119 Type 2 diabetes mellitus without complications: Secondary | ICD-10-CM | POA: Diagnosis not present

## 2018-05-05 LAB — POCT GLYCOSYLATED HEMOGLOBIN (HGB A1C): Hemoglobin A1C: 7 % — AB (ref 4.0–5.6)

## 2018-05-05 MED ORDER — INSULIN GLARGINE 100 UNIT/ML SOLOSTAR PEN: PEN_INJECTOR | SUBCUTANEOUS | Status: DC

## 2018-05-05 NOTE — Patient Instructions (Signed)
Keep working on diet and recheck in about 3 months.  The only lab you need to have done for your next diabetic visit is an A1c.  We can do this with a fingerstick test at the office visit.  You do not need a lab visit ahead of time for this.  It does not matter if you are fasting when the lab is done.   Update me as needed.   Take care.  Glad to see you.  Thanks for your effort.

## 2018-05-05 NOTE — Progress Notes (Signed)
Diabetes:  Using medications without difficulties: yes Hypoglycemic episodes:no Hyperglycemic episodes:no Feet problems:no Blood Sugars averaging: 110-120s eye exam within last year: yes A1c d/w pt, improved to 7.   He is sticking with diet.  On low carb diet.   He feels good.   He is taking 18 to 20 units of insulin per day and metformin twice a day.   Flu shot today.   D/w pt about Tdap, he has a grandchild on the way.    He has nonobstructive CAD by cath and he has tolerated ASA 81mg  w/o ADE.  Discussed with patient that it may be reasonable to continue aspirin as is.  Meds, vitals, and allergies reviewed.  ROS: Per HPI unless specifically indicated in ROS section   GEN: nad, alert and oriented HEENT: mucous membranes moist NECK: supple w/o LA CV: rrr. PULM: ctab, no inc wob ABD: soft, +bs EXT: no edema SKIN: no acute rash

## 2018-05-07 NOTE — Assessment & Plan Note (Signed)
A1c d/w pt, improved to 7.   He is sticking with diet.  On low carb diet.   He feels good.   He is taking 18 to 20 units of insulin per day and metformin twice a day.  I thanked him for his effort.  Continue work on diet and exercise.  Recheck periodically.  We may be able to taper off some of his insulin in the future.  See avs.

## 2018-07-10 NOTE — Progress Notes (Signed)
Follow-up Outpatient Visit Date: 07/12/2018  Primary Care Provider: Tonia Ghent, MD [place] [address]  Chief Complaint: Follow-up coronary artery disease  HPI:  Carl Barnes is a 67 y.o. year-old male with history of nonobstructive coronary artery disease, hyperlipidemia, diabetes, and tobacco use, who presents for follow-up of coronary artery disease.  He was previously followed in our office by Dr. Yvone Neu and was last seen by Ignacia Bayley, NP, in 06/2017.  He was doing well at that time without symptoms.  Today, Carl Barnes reports that he has continued to do well.  He does not exercise regularly but is very active at home and does strenuous activities without any limitations.  He denies chest pain, shortness of breath, palpitations, orthopnea, and edema.  He notes rare orthostatic lightheadedness but otherwise no dizziness.  He stopped taking aspirin over the last few months after hearing news reports suggesting that it not be used for primary prevention.  He was previously tolerating aspirin well.  He has had issues with frequent bowel movements on high doses of metformin and is now on 500 mg BID plus basal insulin.  --------------------------------------------------------------------------------------------------  Past Medical History:  Diagnosis Date  . Cholangitis 12/11   [facility] admission - ERCP for stone removal per GI  . Diabetes mellitus without complication ([place])   . Elevated glucose 10/16 to 06/09/2004   [place] admission, mild elevated cholesterol  . History of echocardiogram    a. 05/2017 Echo: EF 55-60%, mild LVH, no rwma, mild MR.  Marland Kitchen Hyperlipidemia 12/21/1997  . Morbid obesity ([place])   . Non-obstructive CAD (coronary artery disease)    a. 05/2016 Abnl St Test: intermediate risk, large, partially reversible inf/infsept defect consistent w/ ischemia/? scar;  b. 06/2016 Cath: LM nl, LAD 28m, RI nl, LCX nl, OM1/2/3 nl, RCA 40p/m, 30d, RPDA/RPAV nl.  .  Poisoning by black widow spider bite 1979  . RBBB   . Renal stones   . Tobacco abuse    Past Surgical History:  Procedure Laterality Date  . CARDIAC CATHETERIZATION Left 07/06/2016   Procedure: Left Heart Cath and Coronary Angiography;  Surgeon: Nelva Bush, MD;  Location: [facility];  Service: Cardiovascular;  Laterality: Left;  . Cardiolite  06/08/2004   WNL  . CATARACT EXTRACTION W/ INTRAOCULAR LENS IMPLANT Right 10/13/2017   Dr. Satira Sark  . CATARACT EXTRACTION W/ INTRAOCULAR LENS IMPLANT Left 09/08/2017   Dr.Tanner  . Chest CT w/contrast  06/08/04   Negative PE  . CHOLECYSTECTOMY  01/2011   Diseased GB.  [facility]  . Head MRI  12/99   Normal (R/O acoustic neuroma)    Current Meds  Medication Sig  . cetirizine (ZYRTEC) 10 MG tablet Take 10 mg by mouth daily as needed (for seasonal allergies with lawn mowing).   Marland Kitchen glucose blood (ACCU-CHEK AVIVA PLUS) test strip Check sugar daily, up to 3 times daily.  Insulin treated DM2  . ibuprofen (ADVIL,MOTRIN) 200 MG tablet Take 600 mg by mouth every 8 (eight) hours as needed (for pain.).  . Insulin Glargine (LANTUS SOLOSTAR) 100 UNIT/ML Solostar Pen 18-20 units at night. If AM sugar is below 130, then decrease 1 unit.  If >150, add a unit.  If 130-150, no change  . Insulin Pen Needle (PEN NEEDLES) 31G X 5 MM MISC 1 Device by Does not apply route daily. Use daily with pen needle.  Dx E11.9.  . metFORMIN (GLUCOPHAGE) 500 MG tablet Take 1 tablet (500 mg total) by  mouth 2 (two) times daily with a meal.  . pravastatin (PRAVACHOL) 40 MG tablet Take 1 tablet (40 mg total) every evening by mouth.    Allergies: Chantix [varenicline]; Codeine; and Metformin and related  Social History   Tobacco Use  . Smoking status: Former Smoker    Packs/day: 0.25    Years: 35.00    Pack years: 8.75    Types: Cigarettes    Last attempt to quit: 10/10/2017    Years since quitting: 0.7  . Smokeless tobacco: Never Used  Substance Use Topics  .  Alcohol use: Yes    Alcohol/week: 2.0 standard drinks    Types: 2 Shots of liquor per week    Comment: socially  . Drug use: No    Family History  Problem Relation Age of Onset  . Cancer Mother        Pancreatic  . Hyperlipidemia Mother   . Stroke Brother   . Heart disease Brother   . Heart disease Paternal Grandfather        MI  . Diabetes Neg Hx   . Alcohol abuse Neg Hx   . Drug abuse Neg Hx   . Depression Neg Hx   . Colon cancer Neg Hx   . Prostate cancer Neg Hx     Review of Systems: A 12-system review of systems was performed and was negative except as noted in the HPI.  --------------------------------------------------------------------------------------------------  Physical Exam: BP 126/74 (BP Location: Left Arm, Patient Position: Sitting, Cuff Size: Normal)   Pulse 69   Ht 5\' 11"  (1.803 m)   Wt 215 lb 4 oz (97.6 kg)   BMI 30.02 kg/m   General:  NAD HEENT: No conjunctival pallor or scleral icterus. Moist mucous membranes.  OP clear. Neck: Supple without lymphadenopathy, thyromegaly, JVD, or HJR.  Lungs: Normal work of breathing. Clear to auscultation bilaterally without wheezes or crackles. Heart: Regular rate and rhythm without murmurs, rubs, or gallops. Non-displaced PMI. Abd: Bowel sounds present. Soft, NT/ND without hepatosplenomegaly Ext: No lower extremity edema. Radial, PT, and DP pulses are 2+ bilaterally. Skin: Warm and dry without rash.  EKG:  NSR with LAD, LVH, and IVCD.  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  Lab Results  Component Value Date   WBC 7.6 06/23/2016   HGB 16.4 06/23/2016   HCT 47.3 06/23/2016   MCV 94 06/23/2016   PLT 159 06/23/2016    Lab Results  Component Value Date   NA 138 10/18/2017   K 4.8 10/18/2017   CL 103 10/18/2017   CO2 28 10/18/2017   BUN 14 10/18/2017   CREATININE 1.02 10/18/2017   GLUCOSE 384 (H) 10/18/2017   ALT 21 10/18/2017    Lab Results  Component Value Date    CHOL 121 10/18/2017   HDL 26.50 (L) 10/18/2017   LDLCALC 60 09/13/2016   LDLDIRECT 57.0 10/18/2017   TRIG 308.0 (H) 10/18/2017   CHOLHDL 5 10/18/2017    --------------------------------------------------------------------------------------------------  ASSESSMENT AND PLAN: Nonobstructive coronary artery disease Carl Barnes remains asymptomatic (CCS class I).  We discussed the importance of medical therapy and lifestyle modifications to prevent progression of disease.  I encouraged him to begin taking aspirin 81 mg daily again.  His lipids and diabetes are under good control now; we will not make any medication changes.  I encouraged him to remain active and to lose weight, if possible.  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  Carl Barnes does not have any symptoms of high-grade AV block.  He is not on any AV nodal blocking agents.  I advised him to let us know if he develops increased fatigue, persistent lightheadedness/dizziness, or bradycardia.  We will continue annual EKG follow-up.  Hyperlipidemia LDL at goal (<70) on last check in 09/2017.  We will continue with pravastatin.  Follow-up: Return to clinic in 1 year.  Nelva Bush, MD 07/12/2018 9:03 AM

## 2018-07-12 ENCOUNTER — Ambulatory Visit: Payer: Medicare Other | Admitting: Internal Medicine

## 2018-07-12 ENCOUNTER — Encounter: Payer: Self-pay | Admitting: Internal Medicine

## 2018-07-12 VITALS — BP 126/74 | HR 69 | Ht 71.0 in | Wt 215.2 lb

## 2018-07-12 DIAGNOSIS — E785 Hyperlipidemia, unspecified: Secondary | ICD-10-CM | POA: Diagnosis not present

## 2018-07-12 DIAGNOSIS — I2510 Atherosclerotic heart disease of native coronary artery without angina pectoris: Secondary | ICD-10-CM

## 2018-07-12 DIAGNOSIS — R9431 Abnormal electrocardiogram [ECG] [EKG]: Secondary | ICD-10-CM | POA: Diagnosis not present

## 2018-07-12 DIAGNOSIS — I4510 Unspecified right bundle-branch block: Secondary | ICD-10-CM | POA: Insufficient documentation

## 2018-07-12 MED ORDER — ASPIRIN EC 81 MG PO TBEC: 81.0000 mg | DELAYED_RELEASE_TABLET | Freq: Every day | ORAL | 3 refills | Status: DC

## 2018-07-12 NOTE — Patient Instructions (Addendum)
Medication Instructions:  Your physician has recommended you make the following change in your medication:  1- START TAKING Aspirin 81 mg by mouth once a day.   If you need a refill on your cardiac medications before your next appointment, please call your pharmacy.   Lab work: none If you have labs (blood work) drawn today and your tests are completely normal, you will receive your results only by: Marland Kitchen MyChart Message (if you have MyChart) OR . A paper copy in the mail If you have any lab test that is abnormal or we need to change your treatment, we will call you to review the results.  Testing/Procedures: none  Follow-Up: At [facility], you and your health needs are our priority.  As part of our continuing mission to provide you with exceptional heart care, we have created designated Provider Care Teams.  These Care Teams include your primary Cardiologist (physician) and Advanced Practice Providers (APPs -  Physician Assistants and Nurse Practitioners) who all work together to provide you with the care you need, when you need it. You will need a follow up appointment in 1 years.  Please call our office 2 months in advance to schedule this appointment.  You may see DR Harrell Gave END or one of the following Advanced Practice Providers on your designated Care Team:   Murray Hodgkins, NP Christell Faith, PA-C . Marrianne Mood, PA-C

## 2018-08-08 ENCOUNTER — Other Ambulatory Visit: Payer: Self-pay | Admitting: Internal Medicine

## 2018-08-25 ENCOUNTER — Other Ambulatory Visit: Payer: Self-pay | Admitting: Family Medicine

## 2018-09-18 ENCOUNTER — Telehealth: Payer: Self-pay | Admitting: Family Medicine

## 2018-09-18 NOTE — Telephone Encounter (Signed)
Left message asking pt to call office Please let pt know his 3/4 appointment with [facility] wellness nurse has been changed to lab only appointment.  Lattie Haw will be out of office that day

## 2018-10-12 ENCOUNTER — Other Ambulatory Visit: Payer: Self-pay | Admitting: Family Medicine

## 2018-10-23 ENCOUNTER — Other Ambulatory Visit: Payer: Self-pay | Admitting: Family Medicine

## 2018-10-23 DIAGNOSIS — E119 Type 2 diabetes mellitus without complications: Secondary | ICD-10-CM

## 2018-10-23 DIAGNOSIS — Z125 Encounter for screening for malignant neoplasm of prostate: Secondary | ICD-10-CM

## 2018-10-23 DIAGNOSIS — Z794 Long term (current) use of insulin: Principal | ICD-10-CM

## 2018-10-25 ENCOUNTER — Ambulatory Visit: Payer: Self-pay

## 2018-10-25 ENCOUNTER — Other Ambulatory Visit (INDEPENDENT_AMBULATORY_CARE_PROVIDER_SITE_OTHER): Payer: Medicare Other

## 2018-10-25 DIAGNOSIS — Z125 Encounter for screening for malignant neoplasm of prostate: Secondary | ICD-10-CM

## 2018-10-25 DIAGNOSIS — E119 Type 2 diabetes mellitus without complications: Secondary | ICD-10-CM

## 2018-10-25 DIAGNOSIS — Z794 Long term (current) use of insulin: Secondary | ICD-10-CM

## 2018-10-25 LAB — COMPREHENSIVE METABOLIC PANEL
ALT: 26 U/L (ref 0–53)
AST: 20 U/L (ref 0–37)
Albumin: 4.5 g/dL (ref 3.5–5.2)
Alkaline Phosphatase: 71 U/L (ref 39–117)
BUN: 16 mg/dL (ref 6–23)
CO2: 26 meq/L (ref 19–32)
Calcium: 9.8 mg/dL (ref 8.4–10.5)
Chloride: 103 mEq/L (ref 96–112)
Creatinine, Ser: 0.93 mg/dL (ref 0.40–1.50)
GFR: 80.67 mL/min (ref >60.00)
GLUCOSE: 186 mg/dL — AB (ref 70–99)
Potassium: 5.1 mEq/L (ref 3.5–5.1)
Sodium: 137 mEq/L (ref 135–145)
Total Bilirubin: 0.9 mg/dL (ref 0.2–1.2)
Total Protein: 6.9 g/dL (ref 6.0–8.3)

## 2018-10-25 LAB — LIPID PANEL
CHOL/HDL RATIO: 4
Cholesterol: 138 mg/dL (ref 0–200)
HDL: 30.9 mg/dL — ABNORMAL LOW (ref >39.00)
LDL Cholesterol: 71 mg/dL (ref 0–99)
NONHDL: 107.56
Triglycerides: 185 mg/dL — ABNORMAL HIGH (ref 0.0–149.0)
VLDL: 37 mg/dL (ref 0.0–40.0)

## 2018-10-25 LAB — HEMOGLOBIN A1C: Hgb A1c MFr Bld: 9.8 % — ABNORMAL HIGH (ref 4.6–6.5)

## 2018-10-25 LAB — MICROALBUMIN / CREATININE URINE RATIO
Creatinine,U: 222.9 mg/dL
Microalb Creat Ratio: 0.7 mg/g (ref 0.0–30.0)
Microalb, Ur: 1.5 mg/dL (ref 0.0–1.9)

## 2018-10-25 LAB — PSA, MEDICARE: PSA: 0.39 ng/ml (ref 0.10–4.00)

## 2018-10-27 ENCOUNTER — Encounter: Payer: Self-pay | Admitting: Family Medicine

## 2018-10-27 ENCOUNTER — Ambulatory Visit (INDEPENDENT_AMBULATORY_CARE_PROVIDER_SITE_OTHER): Payer: Medicare Other | Admitting: Family Medicine

## 2018-10-27 VITALS — BP 128/70 | HR 70 | Temp 98.1°F | Ht 71.0 in | Wt 213.2 lb

## 2018-10-27 DIAGNOSIS — R42 Dizziness and giddiness: Secondary | ICD-10-CM

## 2018-10-27 DIAGNOSIS — H00019 Hordeolum externum unspecified eye, unspecified eyelid: Secondary | ICD-10-CM | POA: Diagnosis not present

## 2018-10-27 DIAGNOSIS — F17200 Nicotine dependence, unspecified, uncomplicated: Secondary | ICD-10-CM | POA: Diagnosis not present

## 2018-10-27 DIAGNOSIS — E119 Type 2 diabetes mellitus without complications: Secondary | ICD-10-CM

## 2018-10-27 DIAGNOSIS — E785 Hyperlipidemia, unspecified: Secondary | ICD-10-CM

## 2018-10-27 DIAGNOSIS — Z794 Long term (current) use of insulin: Secondary | ICD-10-CM

## 2018-10-27 MED ORDER — METFORMIN HCL 500 MG PO TABS: 500.0000 mg | ORAL_TABLET | Freq: Two times a day (BID) | ORAL | Status: DC

## 2018-10-27 MED ORDER — INSULIN GLARGINE 100 UNIT/ML SOLOSTAR PEN: PEN_INJECTOR | SUBCUTANEOUS | Status: DC

## 2018-10-27 MED ORDER — MECLIZINE HCL 25 MG PO TABS: 12.5000 mg | ORAL_TABLET | Freq: Three times a day (TID) | ORAL | Status: DC | PRN

## 2018-10-27 NOTE — Progress Notes (Signed)
Diabetes:  Using medications without difficulties: some GI upset but generally tolerable at 500mg  BID.   Hypoglycemic episodes: no Hyperglycemic episodes: no Feet problems: no Blood Sugars averaging: none <150.   eye exam within last year: due, d/w pt.  He has f/u pending.    Elevated Cholesterol: Using medications without problems: yes Muscle aches: not from statin.  Diet compliance: encouraged.  Exercise:encouraged.    He had back pain with less activity for about 3 weeks.  He had f/u with outside clinic and trigger point infections.  His back is some better in the meantime.    He has had episodic vertigo with head turning, looking back over either shoulder.  Had had trouble rolling over when working on a car, when he was laying down.    L upper and R lower lid styes noted.    He has return of seasonal allergies and just restarted zyrtec.    He is back to 3 cigs a day.  D/w pt using gum as needed, with routine cautions and instructions.    PMH and SH reviewed  Meds, vitals, and allergies reviewed.   ROS: Per HPI.  Unless specifically indicated otherwise in HPI, the patient denies:  General: fever. Eyes: acute vision changes ENT: sore throat Cardiovascular: chest pain Respiratory: SOB GI: vomiting GU: dysuria Musculoskeletal: acute back pain Derm: acute rash Neuro: acute motor dysfunction Psych: worsening mood Endocrine: polydipsia Heme: bleeding Allergy: hayfever  GEN: nad, alert and oriented HEENT: mucous membranes moist NECK: supple w/o LA CV: rrr. PULM: ctab, no inc wob ABD: soft, +bs EXT: no edema SKIN: no acute rash CN 2-12 wnl B, S/S/DTR wnl x4 L upper and R lower lid styes noted.    Diabetic foot exam: Normal inspection No skin breakdown No calluses  Normal DP pulses Normal sensation to light touch and monofilament Nails normal

## 2018-10-27 NOTE — Patient Instructions (Addendum)
Use meclizine and bedside exercise for vertigo.   Gradually increase your insulin as needed.  Recheck in about 3 months with Damita Dunnings.  A1c at the visit, you don't have to fast.   Schedule a follow up with [facility] when possible.  Try using nicotine gum as needed.   Take care.  Glad to see you.

## 2018-10-29 DIAGNOSIS — R42 Dizziness and giddiness: Secondary | ICD-10-CM | POA: Insufficient documentation

## 2018-10-29 DIAGNOSIS — H00019 Hordeolum externum unspecified eye, unspecified eyelid: Secondary | ICD-10-CM | POA: Insufficient documentation

## 2018-10-29 NOTE — Assessment & Plan Note (Signed)
Continue work on diet and exercise.  Advised him to gradually increase insulin as needed.  See orders. Recheck in about 3 months.  A1c at the visit. See after visit summary. Labs discussed with patient.  A1c elevation noted.

## 2018-10-29 NOTE — Assessment & Plan Note (Signed)
Vertiginous symptoms, not lightheaded.  Likely BPV.  Discussed with patient about options.  Discussed anatomy.  He can use bedside exercises and use meclizine as needed.  Okay for outpatient follow-up.  He agrees with plan.

## 2018-10-29 NOTE — Assessment & Plan Note (Signed)
Continue statin.  Needs work on diet and exercise.  Labs discussed with patient.  He agrees with plan.  >25 minutes spent in face to face time with patient, >50% spent in counselling or coordination of care/

## 2018-10-29 NOTE — Assessment & Plan Note (Signed)
  Try using nicotine gum as needed.   Routine instructions given to patient.

## 2018-10-29 NOTE — Assessment & Plan Note (Signed)
Advised to use warm compresses.  Update me as needed.

## 2018-12-12 ENCOUNTER — Other Ambulatory Visit: Payer: Self-pay | Admitting: Family Medicine

## 2019-03-21 ENCOUNTER — Other Ambulatory Visit: Payer: Self-pay | Admitting: Family Medicine

## 2019-03-21 DIAGNOSIS — E119 Type 2 diabetes mellitus without complications: Secondary | ICD-10-CM

## 2019-03-21 NOTE — Telephone Encounter (Signed)
Per last note in march 2020 it was noted patient is to follow up in 3 months for Dm but his next appointment on file is for march 2021. Please review.

## 2019-03-21 NOTE — Telephone Encounter (Signed)
Verify current insulin dose with patient.  Let me know and I can adjust the sig and send the prescription.  Schedule follow-up diabetes visit.  Point-of-care A1c ordered.  Thanks.

## 2019-03-22 NOTE — Telephone Encounter (Signed)
Spoke with patient's wife, ok per DPR on file, advised of the reason for the call. Patient will call me back to discuss

## 2019-03-23 NOTE — Telephone Encounter (Signed)
Spoke with patient. Appointment made. Patient uses 30 units daily of Lantus

## 2019-03-23 NOTE — Telephone Encounter (Signed)
Pt called back, unable to reach Anastasiya.  119- 7/30 134- 7/29 166-7/28 157-7/27 107-7/26 128-7/25 163-7/24 157-7/23 190-7/22 124-7/21  I attempted to set up appt for follow up for patient. I offered 12 noon today but patient said it won't work. Attempted other times, pt would not agree and said he does not understand why he needs an appt.

## 2019-04-03 ENCOUNTER — Ambulatory Visit: Payer: Medicare Other | Admitting: Family Medicine

## 2019-04-03 ENCOUNTER — Encounter: Payer: Self-pay | Admitting: Family Medicine

## 2019-04-03 ENCOUNTER — Other Ambulatory Visit: Payer: Self-pay

## 2019-04-03 VITALS — BP 126/68 | HR 64 | Temp 97.5°F | Ht 71.0 in | Wt 214.5 lb

## 2019-04-03 DIAGNOSIS — E119 Type 2 diabetes mellitus without complications: Secondary | ICD-10-CM | POA: Diagnosis not present

## 2019-04-03 DIAGNOSIS — Z23 Encounter for immunization: Secondary | ICD-10-CM | POA: Diagnosis not present

## 2019-04-03 LAB — POCT GLYCOSYLATED HEMOGLOBIN (HGB A1C): Hemoglobin A1C: 7.4 % — AB (ref 4.0–5.6)

## 2019-04-03 NOTE — Progress Notes (Signed)
Diabetes:  Using medications without difficulties: yes, usually with insulin dose adjustment from 27-30 units a day depending on sugar.   Hypoglycemic episodes: no Hyperglycemic episodes: no Feet problems: no Blood Sugars averaging: usually ~ 120-150 eye exam within last year: due, d/w pt.    Flu shot due this fall, d/w pt.    Pandemic considerations d/w pt.  He is masking, etc.  He is helping care for his 69 y/o father.    Pneumonia 23 vaccine done at office visit.  Meds, vitals, and allergies reviewed.   ROS: Per HPI unless specifically indicated in ROS section   GEN: nad, alert and oriented HEENT: ncat NECK: supple w/o LA CV: rrr. PULM: ctab, no inc wob ABD: soft, +bs EXT: no edema SKIN: well perfused.

## 2019-04-03 NOTE — Patient Instructions (Addendum)
I would get a flu shot each fall.   Call about an eye exam when possible.  Pneumonia shot today.  Thanks for your effort.   Recheck at a physical in 6 months, labs ahead of time.  If sugar is worse or other concerns, then recheck in 3 months.  Take care.  Glad to see you.

## 2019-04-04 NOTE — Assessment & Plan Note (Signed)
A1c clearly better.  I thanked him for his effort. Recheck at a physical in 6 months, labs ahead of time.  If sugar is worse or other concerns, then recheck in 3 months.  He agrees with plan.  See above.

## 2019-04-24 ENCOUNTER — Other Ambulatory Visit: Payer: Self-pay | Admitting: Family Medicine

## 2019-06-06 DIAGNOSIS — M5416 Radiculopathy, lumbar region: Secondary | ICD-10-CM | POA: Insufficient documentation

## 2019-07-13 ENCOUNTER — Ambulatory Visit (INDEPENDENT_AMBULATORY_CARE_PROVIDER_SITE_OTHER): Payer: Medicare Other | Admitting: Internal Medicine

## 2019-07-13 ENCOUNTER — Other Ambulatory Visit: Payer: Self-pay

## 2019-07-13 ENCOUNTER — Encounter: Payer: Self-pay | Admitting: Internal Medicine

## 2019-07-13 VITALS — BP 138/80 | HR 68 | Ht 71.0 in | Wt 215.2 lb

## 2019-07-13 DIAGNOSIS — M79605 Pain in left leg: Secondary | ICD-10-CM | POA: Diagnosis not present

## 2019-07-13 DIAGNOSIS — E785 Hyperlipidemia, unspecified: Secondary | ICD-10-CM

## 2019-07-13 DIAGNOSIS — I2510 Atherosclerotic heart disease of native coronary artery without angina pectoris: Secondary | ICD-10-CM | POA: Diagnosis not present

## 2019-07-13 DIAGNOSIS — I10 Essential (primary) hypertension: Secondary | ICD-10-CM | POA: Diagnosis not present

## 2019-07-13 NOTE — Patient Instructions (Addendum)
Medication Instructions:  No changes  *If you need a refill on your cardiac medications before your next appointment, please call your pharmacy*  Lab Work: None  If you have labs (blood work) drawn today and your tests are completely normal, you will receive your results only by: Marland Kitchen MyChart Message (if you have MyChart) OR . A paper copy in the mail If you have any lab test that is abnormal or we need to change your treatment, we will call you to review the results.  Testing/Procedures: None  Follow-Up: At [facility], you and your health needs are our priority.  As part of our continuing mission to provide you with exceptional heart care, we have created designated Provider Care Teams.  These Care Teams include your primary Cardiologist (physician) and Advanced Practice Providers (APPs -  Physician Assistants and Nurse Practitioners) who all work together to provide you with the care you need, when you need it.  Your next appointment:   6 month(s)  The format for your next appointment:   In Person  Provider:   Nelva Bush, MD  Other Instructions Please try to stop smoking.     Health Risks of Smoking Smoking cigarettes is very bad for your health. Tobacco smoke has over 200 known poisons in it. It contains the poisonous gases nitrogen oxide and carbon monoxide. There are over 60 chemicals in tobacco smoke that cause cancer. Smoking is difficult to quit because a chemical in tobacco, called nicotine, causes addiction or dependence. When you smoke and inhale, nicotine is absorbed rapidly into the bloodstream through your lungs. Both inhaled and non-inhaled nicotine may be addictive. What are the risks of cigarette smoke? Cigarette smokers have an increased risk of many serious medical problems, including:  Lung cancer.  Lung disease, such as pneumonia, bronchitis, and emphysema.  Chest pain (angina) and heart attack because the heart is not getting enough  oxygen.  Heart disease and peripheral blood vessel disease.  High blood pressure (hypertension).  Stroke.  Oral cancer, including cancer of the lip, mouth, or voice box.  Bladder cancer.  Pancreatic cancer.  Cervical cancer.  Pregnancy complications, including premature birth.  Stillbirths and smaller newborn babies, birth defects, and genetic damage to sperm.  Early menopause.  Lower estrogen level for women.  Infertility.  Facial wrinkles.  Blindness.  Increased risk of broken bones (fractures).  Senile dementia.  Stomach ulcers and internal bleeding.  Delayed wound healing and increased risk of complications during surgery.  Even smoking lightly shortens your life expectancy by several years. Because of secondhand smoke exposure, children of smokers have an increased risk of the following:  Sudden infant death syndrome (SIDS).  Respiratory infections.  Lung cancer.  Heart disease.  Ear infections. What are the benefits of quitting? There are many health benefits of quitting smoking. Here are some of them:  Within days of quitting smoking, your risk of having a heart attack decreases, your blood flow improves, and your lung capacity improves. Blood pressure, pulse rate, and breathing patterns start returning to normal soon after quitting.  Within months, your lungs may clear up completely.  Quitting for 10 years reduces your risk of developing lung cancer and heart disease to almost that of a nonsmoker.  People who quit may see an improvement in their overall quality of life. How do I quit smoking?     Smoking is an addiction with both physical and psychological effects, and longtime habits can be hard to change. Your health care  provider can recommend:  Programs and community resources, which may include group support, education, or talk therapy.  Prescription medicines to help reduce cravings.  Nicotine replacement products, such as patches,  gum, and nasal sprays. Use these products only as directed. Do not replace cigarette smoking with electronic cigarettes, which are commonly called e-cigarettes. The safety of e-cigarettes is not known, and some may contain harmful chemicals.  A combination of two or more of these methods. Where to find more information  American Lung Association: www.lung.org  American Cancer Society: www.cancer.org Summary  Smoking cigarettes is very bad for your health. Cigarette smokers have an increased risk of many serious medical problems, including several cancers, heart disease, and stroke.  Smoking is an addiction with both physical and psychological effects, and longtime habits can be hard to change.  By stopping right away, you can greatly reduce the risk of medical problems for you and your family.  To help you quit smoking, your health care provider can recommend programs, community resources, prescription medicines, and nicotine replacement products such as patches, gum, and nasal sprays. This information is not intended to replace advice given to you by your health care provider. Make sure you discuss any questions you have with your health care provider. Document Released: 09/16/2004 Document Revised: 11/10/2017 Document Reviewed: 08/13/2016 Elsevier Patient Education  2020 [employer].  Steps to Quit Smoking Smoking tobacco is the leading cause of preventable death. It can affect almost every organ in the body. Smoking puts you and people around you at risk for many serious, long-lasting (chronic) diseases. Quitting smoking can be hard, but it is one of the best things that you can do for your health. It is never too late to quit. How do I get ready to quit? When you decide to quit smoking, make a plan to help you succeed. Before you quit:  Pick a date to quit. Set a date within the next 2 weeks to give you time to prepare.  Write down the reasons why you are quitting. Keep this list in  places where you will see it often.  Tell your family, friends, and co-workers that you are quitting. Their support is important.  Talk with your doctor about the choices that may help you quit.  Find out if your health insurance will pay for these treatments.  Know the people, places, things, and activities that make you want to smoke (triggers). Avoid them. What first steps can I take to quit smoking?  Throw away all cigarettes at home, at work, and in your car.  Throw away the things that you use when you smoke, such as ashtrays and lighters.  Clean your car. Make sure to empty the ashtray.  Clean your home, including curtains and carpets. What can I do to help me quit smoking? Talk with your doctor about taking medicines and seeing a counselor at the same time. You are more likely to succeed when you do both.  If you are pregnant or breastfeeding, talk with your doctor about counseling or other ways to quit smoking. Do not take medicine to help you quit smoking unless your doctor tells you to do so. To quit smoking: Quit right away  Quit smoking totally, instead of slowly cutting back on how much you smoke over a period of time.  Go to counseling. You are more likely to quit if you go to counseling sessions regularly. Take medicine You may take medicines to help you quit. Some medicines need a  prescription, and some you can buy over-the-counter. Some medicines may contain a drug called nicotine to replace the nicotine in cigarettes. Medicines may:  Help you to stop having the desire to smoke (cravings).  Help to stop the problems that come when you stop smoking (withdrawal symptoms). Your doctor may ask you to use:  Nicotine patches, gum, or lozenges.  Nicotine inhalers or sprays.  Non-nicotine medicine that is taken by mouth. Find resources Find resources and other ways to help you quit smoking and remain smoke-free after you quit. These resources are most helpful when  you use them often. They include:  Online chats with a Social worker.  Phone quitlines.  Printed Furniture conservator/restorer.  Support groups or group counseling.  Text messaging programs.  Mobile phone apps. Use apps on your mobile phone or tablet that can help you stick to your quit plan. There are many free apps for mobile phones and tablets as well as websites. Examples include Quit Guide from the [employer] and smokefree.gov  What things can I do to make it easier to quit?   Talk to your family and friends. Ask them to support and encourage you.  Call a phone quitline (1-800-QUIT-NOW), reach out to support groups, or work with a Social worker.  Ask people who smoke to not smoke around you.  Avoid places that make you want to smoke, such as: ? Bars. ? Parties. ? Smoke-break areas at work.  Spend time with people who do not smoke.  Lower the stress in your life. Stress can make you want to smoke. Try these things to help your stress: ? Getting regular exercise. ? Doing deep-breathing exercises. ? Doing yoga. ? Meditating. ? Doing a body scan. To do this, close your eyes, focus on one area of your body at a time from head to toe. Notice which parts of your body are tense. Try to relax the muscles in those areas. How will I feel when I quit smoking? Day 1 to 3 weeks Within the first 24 hours, you may start to have some problems that come from quitting tobacco. These problems are very bad 2-3 days after you quit, but they do not often last for more than 2-3 weeks. You may get these symptoms:  Mood swings.  Feeling restless, nervous, angry, or annoyed.  Trouble concentrating.  Dizziness.  Strong desire for high-sugar foods and nicotine.  Weight gain.  Trouble pooping (constipation).  Feeling like you may vomit (nausea).  Coughing or a sore throat.  Changes in how the medicines that you take for other issues work in your body.  Depression.  Trouble sleeping (insomnia). Week 3  and afterward After the first 2-3 weeks of quitting, you may start to notice more positive results, such as:  Better sense of smell and taste.  Less coughing and sore throat.  Slower heart rate.  Lower blood pressure.  Clearer skin.  Better breathing.  Fewer sick days. Quitting smoking can be hard. Do not give up if you fail the first time. Some people need to try a few times before they succeed. Do your best to stick to your quit plan, and talk with your doctor if you have any questions or concerns. Summary  Smoking tobacco is the leading cause of preventable death. Quitting smoking can be hard, but it is one of the best things that you can do for your health.  When you decide to quit smoking, make a plan to help you succeed.  Quit smoking right away,  not slowly over a period of time.  When you start quitting, seek help from your doctor, family, or friends. This information is not intended to replace advice given to you by your health care provider. Make sure you discuss any questions you have with your health care provider. Document Released: 06/05/2009 Document Revised: 10/27/2018 Document Reviewed: 10/28/2018 Elsevier Patient Education  2020 [employer].

## 2019-07-13 NOTE — Progress Notes (Signed)
Follow-up Outpatient Visit Date: 07/13/2019  Primary Care Provider: Tonia Ghent, MD [place] [address]  Chief Complaint: Left knee pain  HPI:  Carl Barnes is a 68 y.o. male with history of nonobstructive coronary artery disease, hyperlipidemia, type 2 diabetes mellitus, and tobacco use, who presents for follow-up of coronary artery disease.  I last saw him a year ago, at which time Carl Barnes was doing well without chest pain, shortness of breath, or other limitations.  We did not make any medication changes or pursue additional testing at that time, though I encouraged him to be compliant with low-dose aspirin in the setting of known CAD.  Today, Carl Barnes reports that he is doing well other than pain in his left leg.  He is concerned that some of it could be related to his knee, though recent orthopedic evaluation suggested it may actually be coming from his low back.  He participated in physical therapy but feels like this may have only worsened his knee discomfort.  He also notes occasional pain extending from his left knee down to the foot along the lateral side of his calf.  This has been present for about a year but does not seem to be improving.  It is not related to walking.  He denies ulcers or sores on either foot.  He has been using NSAIDs and methocarbamol with some relief.  Carl Barnes denies chest pain, shortness of breath, palpitations, lightheadedness, and edema.  Home blood pressures have been 120-130/60-70.  --------------------------------------------------------------------------------------------------  Past Medical History:  Diagnosis Date  . Cholangitis 12/11   [facility] admission - ERCP for stone removal per GI  . Diabetes mellitus without complication ([place])   . Elevated glucose 10/16 to 06/09/2004   [facility] admission, mild elevated cholesterol  . History of echocardiogram    a. 05/2017 Echo: EF 55-60%, mild LVH, no rwma, mild MR.  Marland Kitchen  Hyperlipidemia 12/21/1997  . Morbid obesity ([place])   . Non-obstructive CAD (coronary artery disease)    a. 05/2016 Abnl St Test: intermediate risk, large, partially reversible inf/infsept defect consistent w/ ischemia/? scar;  b. 06/2016 Cath: LM nl, LAD 74m, RI nl, LCX nl, OM1/2/3 nl, RCA 40p/m, 30d, RPDA/RPAV nl.  . Poisoning by black widow spider bite 1979  . RBBB   . Renal stones   . Tobacco abuse    Past Surgical History:  Procedure Laterality Date  . CARDIAC CATHETERIZATION Left 07/06/2016   Procedure: Left Heart Cath and Coronary Angiography;  Surgeon: Nelva Bush, MD;  Location: [facility];  Service: Cardiovascular;  Laterality: Left;  . Cardiolite  06/08/2004   WNL  . CATARACT EXTRACTION W/ INTRAOCULAR LENS IMPLANT Right 10/13/2017   Dr. Satira Sark  . CATARACT EXTRACTION W/ INTRAOCULAR LENS IMPLANT Left 09/08/2017   Dr.Tanner  . Chest CT w/contrast  06/08/04   Negative PE  . CHOLECYSTECTOMY  01/2011   Diseased GB.  [facility]  . Head MRI  12/99   Normal (R/O acoustic neuroma)    Current Meds  Medication Sig  . ACCU-CHEK AVIVA PLUS test strip CHECK SUGAR DAILY UP TO 3 TIMES DAILY. INSULIN TREATED DM2  . aspirin EC 81 MG tablet Take 1 tablet (81 mg total) by mouth daily.  . B-D UF III MINI PEN NEEDLES 31G X 5 MM MISC USE DAILY WITH PEN NEEDLE  . cetirizine (ZYRTEC) 10 MG tablet Take 10 mg by mouth daily as needed (for seasonal allergies with lawn mowing).   Marland Kitchen  ibuprofen (ADVIL,MOTRIN) 200 MG tablet Take 600 mg by mouth every 8 (eight) hours as needed (for pain.).  . Insulin Glargine (LANTUS SOLOSTAR) 100 UNIT/ML Solostar Pen Use 30 units once daily. SQ. DX:E11.9  . meclizine (ANTIVERT) 25 MG tablet Take 0.5-1 tablets (12.5-25 mg total) by mouth 3 (three) times daily as needed for dizziness.  . metFORMIN (GLUCOPHAGE) 500 MG tablet Take 1 tablet (500 mg total) by mouth 2 (two) times daily with a meal.  . pravastatin (PRAVACHOL) 40 MG tablet TAKE 1 TABLET BY MOUTH EVERY  EVENING    Allergies: Chantix [varenicline], Codeine, and Metformin and related  Social History   Tobacco Use  . Smoking status: Current Every Day Smoker    Packs/day: 0.25    Years: 35.00    Pack years: 8.75    Types: Cigarettes    Last attempt to quit: 10/10/2017    Years since quitting: 1.7  . Smokeless tobacco: Never Used  Substance Use Topics  . Alcohol use: Yes    Alcohol/week: 2.0 standard drinks    Types: 2 Shots of liquor per week    Comment: socially  . Drug use: No    Family History  Problem Relation Age of Onset  . Cancer Mother        Pancreatic  . Hyperlipidemia Mother   . Stroke Brother   . Heart disease Brother   . Heart disease Paternal Grandfather        MI  . Diabetes Neg Hx   . Alcohol abuse Neg Hx   . Drug abuse Neg Hx   . Depression Neg Hx   . Colon cancer Neg Hx   . Prostate cancer Neg Hx     Review of Systems: A 12-system review of systems was performed and was negative except as noted in the HPI.  --------------------------------------------------------------------------------------------------  Physical Exam: BP 138/80 (BP Location: Left Arm, Patient Position: Sitting, Cuff Size: Normal)   Pulse 68   Ht 5\' 11"  (1.803 m)   Wt 215 lb 4 oz (97.6 kg)   SpO2 98%   BMI 30.02 kg/m   General: NAD. HEENT: No conjunctival pallor or scleral icterus. Facemask in place. Neck: Supple without lymphadenopathy, thyromegaly, JVD, or HJR. Lungs: Normal work of breathing. Clear to auscultation bilaterally without wheezes or crackles. Heart: Regular rate and rhythm without murmurs, rubs, or gallops. Non-displaced PMI. Abd: Bowel sounds present. Soft, NT/ND without hepatosplenomegaly Ext: No lower extremity edema. Radial, PT, and DP pulses are 2+ bilaterally. Skin: Warm and dry without rash.  EKG:  NSR with left axis deviation and IVCD.  No significant change from 07/13/2019.  Lab Results  Component Value Date   WBC 7.6 06/23/2016   HGB 16.4  06/23/2016   HCT 47.3 06/23/2016   MCV 94 06/23/2016   PLT 159 06/23/2016    Lab Results  Component Value Date   NA 137 10/25/2018   K 5.1 10/25/2018   CL 103 10/25/2018   CO2 26 10/25/2018   BUN 16 10/25/2018   CREATININE 0.93 10/25/2018   GLUCOSE 186 (H) 10/25/2018   ALT 26 10/25/2018    Lab Results  Component Value Date   CHOL 138 10/25/2018   HDL 30.90 (L) 10/25/2018   LDLCALC 71 10/25/2018   LDLDIRECT 57.0 10/18/2017   TRIG 185.0 (H) 10/25/2018   CHOLHDL 4 10/25/2018    --------------------------------------------------------------------------------------------------  ASSESSMENT AND PLAN: Nonobstructive coronary artery disease: Carl Barnes continues to remain free of angina.  We will continue aspirin and  pravastatin to prevent progression of previously noted moderate CAD.  I have encouraged him to quit smoking.  Hypertension: Blood pressure mildly elevated today (goal less than 130/80).  Blood pressures have been better controlled at home and at previous provider visits.  We will therefore defer medication changes.  I have encouraged him to limit his sodium intake.  Left knee/leg pain may also be contributing to elevated blood pressure readings.  Left leg pain: Most likely musculoskeletal.  Vascular exam today is notable for brisk pedal pulses in both feet.  I do not see any ulcerations or skin breakdown on either foot.  Carl Barnes should continue following up with his orthopedist.  Hyperlipidemia: LDL borderline at 71 on last check.  We will continue pravastatin and work on lifestyle modifications.  Follow-up: Return to clinic in 6 months.  Nelva Bush, MD 07/13/2019 9:17 AM

## 2019-07-30 ENCOUNTER — Other Ambulatory Visit: Payer: Self-pay | Admitting: *Deleted

## 2019-07-30 MED ORDER — PRAVASTATIN SODIUM 40 MG PO TABS: 40.0000 mg | ORAL_TABLET | Freq: Every evening | ORAL | 0 refills | Status: DC

## 2019-08-09 ENCOUNTER — Ambulatory Visit (INDEPENDENT_AMBULATORY_CARE_PROVIDER_SITE_OTHER): Payer: Medicare Other | Admitting: Family Medicine

## 2019-08-09 ENCOUNTER — Telehealth: Payer: Self-pay

## 2019-08-09 ENCOUNTER — Other Ambulatory Visit: Payer: Self-pay

## 2019-08-09 VITALS — Ht 71.0 in

## 2019-08-09 DIAGNOSIS — R300 Dysuria: Secondary | ICD-10-CM

## 2019-08-09 LAB — POC URINALSYSI DIPSTICK (AUTOMATED)
Bilirubin, UA: NEGATIVE
Glucose, UA: NEGATIVE
Nitrite, UA: POSITIVE
Protein, UA: POSITIVE — AB
Spec Grav, UA: 1.025 (ref 1.010–1.025)
Urobilinogen, UA: 0.2 E.U./dL
pH, UA: 6 (ref 5.0–8.0)

## 2019-08-09 MED ORDER — CEPHALEXIN 500 MG PO CAPS: 500.0000 mg | ORAL_CAPSULE | Freq: Three times a day (TID) | ORAL | 0 refills | Status: DC

## 2019-08-09 NOTE — Telephone Encounter (Signed)
pts wife(DPR signed) called back wanting the urine culture report; pts wife thought the culture would be back today and I advised it could take a couple of days to get urine culture usually. Pt has started the Keflex and pts wife request cb when urine culture comes in.

## 2019-08-09 NOTE — Progress Notes (Signed)
Interactive audio and video telecommunications were attempted between this provider and patient, however failed, due to patient having technical difficulties OR patient did not have access to video capability.  We continued and completed visit with audio only.   Virtual Visit via Telephone Note  I connected with patient on 08/09/19  at 10:49 AM  by telephone and verified that I am speaking with the correct person using two identifiers.  Location of patient: home  Location of MD: [place] Name of referring provider (if blank then none associated): Names per persons and role in encounter:  MD: Earlyne Iba, Patient: name listed above.    I discussed the limitations, risks, security and privacy concerns of performing an evaluation and management service by telephone and the availability of in person appointments. I also discussed with the patient that there may be a patient responsible charge related to this service. The patient expressed understanding and agreed to proceed.  CC: lower back ache, R lower back.   History of Present Illness: lower back ache, R lower back, going on for about 3 weeks.  He had been doing a lot of heavy lifting and expected some of this.  He was taking advil and using heating pad.  Then he passed kidney stone with some adherent clot. He has h/o renal stones.  He still has burning with urination.  Yesterday at 4PM he had chills.  Temp 100.2 yesterday, then up to 102.4.  Temp normal today, 97.3.  Last fever was yesterday.    BP today 129/73.   O2 sat 98 Pulse 74  He doesn't feel unwell today except for some mild aches.  He feels better today than yesterday.  He passed the stone about 1 week ago.    No CP, SOB, BLE edema.  No known covid exposures.  Back pain is resolved now.  He still has some burning with urination.     Observations/Objective: nad Speech wnl  Assessment and Plan: Dysuria.  Passed a stone recently.   He'll boil a jar and then wife will  bring urine samples to [facility].  Sample collection d/w pt. ucx ordered.  Will need u/a also.  Start keflex after collecting urine, rx sent.  He agrees.  Routine ER cautions given to patient, he understood.  At this point, still okay for outpatient f/u since he already feels better but still has some dysuria.   Follow Up Instructions: see above.   I discussed the assessment and treatment plan with the patient. The patient was provided an opportunity to ask questions and all were answered. The patient agreed with the plan and demonstrated an understanding of the instructions.   The patient was advised to call back or seek an in-person evaluation if the symptoms worsen or if the condition fails to improve as anticipated.  I provided 15 minutes of non-face-to-face time during this encounter.  Elsie Stain, MD

## 2019-08-09 NOTE — Telephone Encounter (Signed)
See u/a report.  Awaiting ucx.  Thanks.

## 2019-08-11 LAB — URINE CULTURE
MICRO NUMBER:: 1208601
SPECIMEN QUALITY:: ADEQUATE

## 2019-08-12 ENCOUNTER — Encounter: Payer: Self-pay | Admitting: Family Medicine

## 2019-08-12 DIAGNOSIS — R300 Dysuria: Secondary | ICD-10-CM | POA: Insufficient documentation

## 2019-08-12 NOTE — Assessment & Plan Note (Signed)
Dysuria.  Passed a stone recently.  Presumed cystitis. He'll boil a jar and then wife will bring urine samples to [facility].  Sample collection d/w pt. ucx ordered.  Will need u/a also.  Start keflex after collecting urine, rx sent.  He agrees.  Routine ER cautions given to patient, he understood.  At this point, still okay for outpatient f/u since he already feels better but still has some dysuria.

## 2019-09-24 LAB — HM DIABETES EYE EXAM

## 2019-10-10 ENCOUNTER — Encounter: Payer: Self-pay | Admitting: Optometrist

## 2019-10-21 ENCOUNTER — Other Ambulatory Visit: Payer: Self-pay | Admitting: Family Medicine

## 2019-10-21 DIAGNOSIS — Z125 Encounter for screening for malignant neoplasm of prostate: Secondary | ICD-10-CM

## 2019-10-21 DIAGNOSIS — E119 Type 2 diabetes mellitus without complications: Secondary | ICD-10-CM

## 2019-10-25 ENCOUNTER — Telehealth: Payer: Self-pay

## 2019-10-25 NOTE — Telephone Encounter (Signed)
LVM w COVID screen, front door and back lab info 3.4.2021 TLJ

## 2019-10-29 ENCOUNTER — Other Ambulatory Visit (INDEPENDENT_AMBULATORY_CARE_PROVIDER_SITE_OTHER): Payer: Medicare Other

## 2019-10-29 ENCOUNTER — Ambulatory Visit: Payer: Self-pay

## 2019-10-29 ENCOUNTER — Other Ambulatory Visit: Payer: Self-pay

## 2019-10-29 ENCOUNTER — Ambulatory Visit (INDEPENDENT_AMBULATORY_CARE_PROVIDER_SITE_OTHER): Payer: Medicare Other

## 2019-10-29 VITALS — Wt 213.0 lb

## 2019-10-29 DIAGNOSIS — E119 Type 2 diabetes mellitus without complications: Secondary | ICD-10-CM

## 2019-10-29 DIAGNOSIS — Z0000 Encounter for general adult medical examination without abnormal findings: Secondary | ICD-10-CM

## 2019-10-29 DIAGNOSIS — Z125 Encounter for screening for malignant neoplasm of prostate: Secondary | ICD-10-CM | POA: Diagnosis not present

## 2019-10-29 LAB — COMPREHENSIVE METABOLIC PANEL
ALT: 25 U/L (ref 0–53)
AST: 20 U/L (ref 0–37)
Albumin: 4 g/dL (ref 3.5–5.2)
Alkaline Phosphatase: 60 U/L (ref 39–117)
BUN: 12 mg/dL (ref 6–23)
CO2: 27 mEq/L (ref 19–32)
Calcium: 9.3 mg/dL (ref 8.4–10.5)
Chloride: 106 mEq/L (ref 96–112)
Creatinine, Ser: 0.94 mg/dL (ref 0.40–1.50)
GFR: 79.44 mL/min (ref >60.00)
Glucose, Bld: 218 mg/dL — ABNORMAL HIGH (ref 70–99)
Potassium: 4.8 mEq/L (ref 3.5–5.1)
Sodium: 137 mEq/L (ref 135–145)
Total Bilirubin: 0.5 mg/dL (ref 0.2–1.2)
Total Protein: 6.4 g/dL (ref 6.0–8.3)

## 2019-10-29 LAB — LIPID PANEL
Cholesterol: 123 mg/dL (ref 0–200)
HDL: 30.1 mg/dL — ABNORMAL LOW (ref >39.00)
LDL Cholesterol: 59 mg/dL (ref 0–99)
NonHDL: 93.09
Total CHOL/HDL Ratio: 4
Triglycerides: 168 mg/dL — ABNORMAL HIGH (ref 0.0–149.0)
VLDL: 33.6 mg/dL (ref 0.0–40.0)

## 2019-10-29 LAB — MICROALBUMIN / CREATININE URINE RATIO
Creatinine,U: 153.7 mg/dL
Microalb Creat Ratio: 0.5 mg/g (ref 0.0–30.0)
Microalb, Ur: <0.7 mg/dL (ref 0.0–1.9)

## 2019-10-29 LAB — HEMOGLOBIN A1C: Hgb A1c MFr Bld: 8.5 % — ABNORMAL HIGH (ref 4.6–6.5)

## 2019-10-29 LAB — PSA, MEDICARE: PSA: 0.58 ng/ml (ref 0.10–4.00)

## 2019-10-29 NOTE — Patient Instructions (Signed)
Carl Barnes , Thank you for taking time to come for your Medicare Wellness Visit. I appreciate your ongoing commitment to your health goals. Please review the following plan we discussed and let me know if I can assist you in the future.   Screening recommendations/referrals: Colonoscopy: Up to date, completed 04/11/2012 Recommended yearly ophthalmology/optometry visit for glaucoma screening and checkup Recommended yearly dental visit for hygiene and checkup  Vaccinations: Influenza vaccine: Up to date, completed 04/24/2019 Pneumococcal vaccine: Completed series Tdap vaccine: Up to date, completed 05/23/2018 Shingles vaccine: discussed    Advanced directives: Advance directive discussed with you today. Even though you declined this today please call our office should you change your mind and we can give you the proper paperwork for you to fill out.  Conditions/risks identified: diabetes, hypertension, hyperlipidemia   Next appointment: 11/01/2019 @ 9:45 am   Preventive Care 70 Years and Older, Male Preventive care refers to lifestyle choices and visits with your health care provider that can promote health and wellness. What does preventive care include?  A yearly physical exam. This is also called an annual well check.  Dental exams once or twice a year.  Routine eye exams. Ask your health care provider how often you should have your eyes checked.  Personal lifestyle choices, including:  Daily care of your teeth and gums.  Regular physical activity.  Eating a healthy diet.  Avoiding tobacco and drug use.  Limiting alcohol use.  Practicing safe sex.  Taking low doses of aspirin every day.  Taking vitamin and mineral supplements as recommended by your health care provider. What happens during an annual well check? The services and screenings done by your health care provider during your annual well check will depend on your age, overall health, lifestyle risk factors, and  family history of disease. Counseling  Your health care provider may ask you questions about your:  Alcohol use.  Tobacco use.  Drug use.  Emotional well-being.  Home and relationship well-being.  Sexual activity.  Eating habits.  History of falls.  Memory and ability to understand (cognition).  Work and work Statistician. Screening  You may have the following tests or measurements:  Height, weight, and BMI.  Blood pressure.  Lipid and cholesterol levels. These may be checked every 5 years, or more frequently if you are over 32 years old.  Skin check.  Lung cancer screening. You may have this screening every year starting at age 70 if you have a 30-pack-year history of smoking and currently smoke or have quit within the past 15 years.  Fecal occult blood test (FOBT) of the stool. You may have this test every year starting at age 70.  Flexible sigmoidoscopy or colonoscopy. You may have a sigmoidoscopy every 5 years or a colonoscopy every 10 years starting at age 70.  Prostate cancer screening. Recommendations will vary depending on your family history and other risks.  Hepatitis C blood test.  Hepatitis B blood test.  Sexually transmitted disease (STD) testing.  Diabetes screening. This is done by checking your blood sugar (glucose) after you have not eaten for a while (fasting). You may have this done every 1-3 years.  Abdominal aortic aneurysm (AAA) screening. You may need this if you are a current or former smoker.  Osteoporosis. You may be screened starting at age 70 if you are at high risk. Talk with your health care provider about your test results, treatment options, and if necessary, the need for more tests. Vaccines  Your  health care provider may recommend certain vaccines, such as:  Influenza vaccine. This is recommended every year.  Tetanus, diphtheria, and acellular pertussis (Tdap, Td) vaccine. You may need a Td booster every 10 years.  Zoster  vaccine. You may need this after age 62.  Pneumococcal 13-valent conjugate (PCV13) vaccine. One dose is recommended after age 70.  Pneumococcal polysaccharide (PPSV23) vaccine. One dose is recommended after age 70. Talk to your health care provider about which screenings and vaccines you need and how often you need them. This information is not intended to replace advice given to you by your health care provider. Make sure you discuss any questions you have with your health care provider. Document Released: 09/05/2015 Document Revised: 04/28/2016 Document Reviewed: 06/10/2015 Elsevier Interactive Patient Education  2017 [place] Prevention in the Home Falls can cause injuries. They can happen to people of all ages. There are many things you can do to make your home safe and to help prevent falls. What can I do on the outside of my home?  Regularly fix the edges of walkways and driveways and fix any cracks.  Remove anything that might make you trip as you walk through a door, such as a raised step or threshold.  Trim any bushes or trees on the path to your home.  Use bright outdoor lighting.  Clear any walking paths of anything that might make someone trip, such as rocks or tools.  Regularly check to see if handrails are loose or broken. Make sure that both sides of any steps have handrails.  Any raised decks and porches should have guardrails on the edges.  Have any leaves, snow, or ice cleared regularly.  Use sand or salt on walking paths during winter.  Clean up any spills in your garage right away. This includes oil or grease spills. What can I do in the bathroom?  Use night lights.  Install grab bars by the toilet and in the tub and shower. Do not use towel bars as grab bars.  Use non-skid mats or decals in the tub or shower.  If you need to sit down in the shower, use a plastic, non-slip stool.  Keep the floor dry. Clean up any water that spills on the  floor as soon as it happens.  Remove soap buildup in the tub or shower regularly.  Attach bath mats securely with double-sided non-slip rug tape.  Do not have throw rugs and other things on the floor that can make you trip. What can I do in the bedroom?  Use night lights.  Make sure that you have a light by your bed that is easy to reach.  Do not use any sheets or blankets that are too big for your bed. They should not hang down onto the floor.  Have a firm chair that has side arms. You can use this for support while you get dressed.  Do not have throw rugs and other things on the floor that can make you trip. What can I do in the kitchen?  Clean up any spills right away.  Avoid walking on wet floors.  Keep items that you use a lot in easy-to-reach places.  If you need to reach something above you, use a strong step stool that has a grab bar.  Keep electrical cords out of the way.  Do not use floor polish or wax that makes floors slippery. If you must use wax, use non-skid floor wax.  Do not  have throw rugs and other things on the floor that can make you trip. What can I do with my stairs?  Do not leave any items on the stairs.  Make sure that there are handrails on both sides of the stairs and use them. Fix handrails that are broken or loose. Make sure that handrails are as long as the stairways.  Check any carpeting to make sure that it is firmly attached to the stairs. Fix any carpet that is loose or worn.  Avoid having throw rugs at the top or bottom of the stairs. If you do have throw rugs, attach them to the floor with carpet tape.  Make sure that you have a light switch at the top of the stairs and the bottom of the stairs. If you do not have them, ask someone to add them for you. What else can I do to help prevent falls?  Wear shoes that:  Do not have high heels.  Have rubber bottoms.  Are comfortable and fit you well.  Are closed at the toe. Do not wear  sandals.  If you use a stepladder:  Make sure that it is fully opened. Do not climb a closed stepladder.  Make sure that both sides of the stepladder are locked into place.  Ask someone to hold it for you, if possible.  Clearly mark and make sure that you can see:  Any grab bars or handrails.  First and last steps.  Where the edge of each step is.  Use tools that help you move around (mobility aids) if they are needed. These include:  Canes.  Walkers.  Scooters.  Crutches.  Turn on the lights when you go into a dark area. Replace any light bulbs as soon as they burn out.  Set up your furniture so you have a clear path. Avoid moving your furniture around.  If any of your floors are uneven, fix them.  If there are any pets around you, be aware of where they are.  Review your medicines with your doctor. Some medicines can make you feel dizzy. This can increase your chance of falling. Ask your doctor what other things that you can do to help prevent falls. This information is not intended to replace advice given to you by your health care provider. Make sure you discuss any questions you have with your health care provider. Document Released: 06/05/2009 Document Revised: 01/15/2016 Document Reviewed: 09/13/2014 Elsevier Interactive Patient Education  2017 [employer].

## 2019-10-29 NOTE — Progress Notes (Signed)
Subjective:   Carl Barnes is a 69 y.o. male who presents for Medicare Annual/Subsequent preventive examination.  Review of Systems: N/A   This visit is being conducted through telemedicine via telephone at the nurse health advisor's home address due to the COVID-19 pandemic. This patient has given me verbal consent via doximity to conduct this visit, patient states they are participating from their home address. Patient and myself are on the telephone call. There is no referral for this visit. Some vital signs may be absent or patient reported.    Patient identification: identified by name, DOB, and current address   Cardiac Risk Factors include: advanced age (>9men, >53 women);diabetes mellitus;hypertension;dyslipidemia;male gender;smoking/ tobacco exposure     Objective:    Vitals: Wt 213 lb (96.6 kg)   BMI 29.71 kg/m   Body mass index is 29.71 kg/m.  Advanced Directives 10/29/2019 10/18/2017 07/06/2016 06/08/2016  Does Patient Have a Medical Advance Directive? No No No No  Would patient like information on creating a medical advance directive? No - Patient declined Yes (MAU/Ambulatory/Procedural Areas - Information given) - No - patient declined information    Tobacco Social History   Tobacco Use  Smoking Status Current Every Day Smoker  . Packs/day: 0.25  . Years: 35.00  . Pack years: 8.75  . Types: Cigarettes  . Last attempt to quit: 10/10/2017  . Years since quitting: 2.0  Smokeless Tobacco Never Used     Ready to quit: Not Answered Counseling given: Not Answered   Clinical Intake:  Pre-visit preparation completed: Yes  Pain : No/denies pain     Nutritional Risks: None Diabetes: Yes CBG done?: No Did pt. bring in CBG monitor from home?: No  How often do you need to have someone help you when you read instructions, pamphlets, or other written materials from your doctor or pharmacy?: 1 - Never What is the last grade level you completed in school?: 2  years of college  Interpreter Needed?: No  Information entered by :: CJohnson, LPN  Past Medical History:  Diagnosis Date  . Cholangitis 12/11   [facility] admission - ERCP for stone removal per GI  . Diabetes mellitus without complication ([place])   . Elevated glucose 10/16 to 06/09/2004   [place] admission, mild elevated cholesterol  . History of echocardiogram    a. 05/2017 Echo: EF 55-60%, mild LVH, no rwma, mild MR.  Marland Kitchen Hyperlipidemia 12/21/1997  . Morbid obesity ([place])   . Non-obstructive CAD (coronary artery disease)    a. 05/2016 Abnl St Test: intermediate risk, large, partially reversible inf/infsept defect consistent w/ ischemia/? scar;  b. 06/2016 Cath: LM nl, LAD 22m, RI nl, LCX nl, OM1/2/3 nl, RCA 40p/m, 30d, RPDA/RPAV nl.  . Poisoning by black widow spider bite 1979  . RBBB   . Renal stones   . Tobacco abuse    Past Surgical History:  Procedure Laterality Date  . CARDIAC CATHETERIZATION Left 07/06/2016   Procedure: Left Heart Cath and Coronary Angiography;  Surgeon: Nelva Bush, MD;  Location: [facility];  Service: Cardiovascular;  Laterality: Left;  . Cardiolite  06/08/2004   WNL  . CATARACT EXTRACTION W/ INTRAOCULAR LENS IMPLANT Right 10/13/2017   Dr. Satira Sark  . CATARACT EXTRACTION W/ INTRAOCULAR LENS IMPLANT Left 09/08/2017   Dr.Tanner  . Chest CT w/contrast  06/08/04   Negative PE  . CHOLECYSTECTOMY  01/2011   Diseased GB.  [facility]  . Head MRI  12/99   Normal (R/O acoustic neuroma)  Family History  Problem Relation Age of Onset  . Cancer Mother        Pancreatic  . Hyperlipidemia Mother   . Stroke Brother   . Heart disease Brother   . Heart disease Paternal Grandfather        MI  . Diabetes Neg Hx   . Alcohol abuse Neg Hx   . Drug abuse Neg Hx   . Depression Neg Hx   . Colon cancer Neg Hx   . Prostate cancer Neg Hx    Social History   Socioeconomic History  . Marital status: Married    Spouse name: Not on file  . Number of children: 1  .  Years of education: Not on file  . Highest education level: Not on file  Occupational History  . Occupation: PLANT Buyer, retail: UNEMPLOYED    Comment: Electronics engineer  Tobacco Use  . Smoking status: Current Every Day Smoker    Packs/day: 0.25    Years: 35.00    Pack years: 8.75    Types: Cigarettes    Last attempt to quit: 10/10/2017    Years since quitting: 2.0  . Smokeless tobacco: Never Used  Substance and Sexual Activity  . Alcohol use: Yes    Alcohol/week: 2.0 standard drinks    Types: 2 Shots of liquor per week    Comment: socially  . Drug use: No  . Sexual activity: Not on file  Other Topics Concern  . 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 Difficulty of Paying Living Expenses: Not on file  Food Insecurity:   . Worried About Charity fundraiser in the Last Year: Not on file  . Ran Out of Food in the Last Year: Not on file  Transportation Needs:   . Lack of Transportation (Medical): Not on file  . Lack of Transportation (Non-Medical): Not on file  Physical Activity:   . Days of Exercise per Week: Not on file  . Minutes of Exercise per Session: Not on file  Stress:   . Feeling of Stress : Not on file  Social Connections:   . Frequency of Communication with Friends and Family: Not on file  . Frequency of Social Gatherings with Friends and Family: Not on file  . Attends Religious Services: Not on file  . Active Member of Clubs or Organizations: Not on file  . Attends Archivist Meetings: Not on file  . Marital Status: Not on file    Outpatient Encounter Medications as of 10/29/2019  Medication Sig  . ACCU-CHEK AVIVA PLUS test strip CHECK SUGAR DAILY UP TO 3 TIMES DAILY. INSULIN TREATED DM2  . aspirin  EC 81 MG tablet Take 1 tablet (81 mg total) by mouth daily.  . B-D UF III MINI PEN NEEDLES 31G X 5 MM MISC USE DAILY WITH PEN NEEDLE  . cetirizine (ZYRTEC) 10 MG tablet Take 10 mg by mouth daily as needed (for seasonal allergies with lawn mowing).   Marland Kitchen ibuprofen (ADVIL,MOTRIN) 200 MG tablet Take 600 mg by mouth every 8 (eight) hours as needed (for pain.).  . Insulin Glargine (LANTUS SOLOSTAR) 100 UNIT/ML Solostar Pen Use 30 units once daily.  SQ. DX:E11.9  . meclizine (ANTIVERT) 25 MG tablet Take 0.5-1 tablets (12.5-25 mg total) by mouth 3 (three) times daily as needed for dizziness.  . metFORMIN (GLUCOPHAGE) 500 MG tablet Take 1 tablet (500 mg total) by mouth 2 (two) times daily with a meal.  . methocarbamol (ROBAXIN) 500 MG tablet 3 (three) times daily as needed.  . pravastatin (PRAVACHOL) 40 MG tablet Take 1 tablet (40 mg total) by mouth every evening.  . cephALEXin (KEFLEX) 500 MG capsule Take 1 capsule (500 mg total) by mouth 3 (three) times daily. (Patient not taking: Reported on 10/29/2019)   No facility-administered encounter medications on file as of 10/29/2019.    Activities of Daily Living In your present state of health, do you have any difficulty performing the following activities: 10/29/2019  Hearing? N  Vision? N  Difficulty concentrating or making decisions? N  Walking or climbing stairs? N  Dressing or bathing? N  Doing errands, shopping? N  Preparing Food and eating ? N  Using the Toilet? N  In the past six months, have you accidently leaked urine? N  Do you have problems with loss of bowel control? N  Managing your Medications? N  Managing your Finances? N  Housekeeping or managing your Housekeeping? N  Some recent data might be hidden    Patient Care Team: Tonia Ghent, MD as PCP - General (Family Medicine)   Assessment:   This is a routine wellness examination for Paulus.  Exercise Activities and Dietary recommendations Current Exercise Habits: The patient has  a physically strenuous job, but has no regular exercise apart from work., Exercise limited by: None identified  Goals    . DIET - INCREASE LEAN PROTEINS     Starting 10/18/2017, I will continue to eat lean proteins, fruits, and vegetables in an effort to continue losing weight.     . Patient Stated     10/29/2019, I will maintain and continue medications as prescribed.        Fall Risk Fall Risk  10/29/2019 10/27/2018 10/18/2017 07/01/2016 06/24/2016  Falls in the past year? 0 0 No (No Data) Yes  Comment - - - none since previous visit no falls since last visit 06/17/16  Number falls in past yr: 0 - - - -  Comment - - - - -  Injury with Fall? 0 - - - -  Risk for fall due to : Medication side effect - - - -  Follow up Falls evaluation completed;Falls prevention discussed - - - -  Comment - - - - -   Is the patient's home free of loose throw rugs in walkways, pet beds, electrical cords, etc?   yes      Grab bars in the bathroom? no      Handrails on the stairs?   yes      Adequate lighting?   yes  Timed Get Up and Go Performed: N/A  Depression Screen PHQ 2/9 Scores 10/29/2019 10/27/2018 10/18/2017 06/08/2016  PHQ - 2 Score 0 0 0 0  PHQ- 9 Score 0 - 0 -    Cognitive Function MMSE - Mini Mental State Exam 10/29/2019 10/18/2017  Orientation to time 5 5  Orientation to Place 5 5  Registration 3 3  Attention/ Calculation 5 0  Recall 3 3  Language- name 2 objects - 0  Language- repeat 1 1  Language- follow 3 step command - 3  Language- read & follow direction - 0  Write a sentence -  0  Copy design - 0  Total score - 20  Mini Cog  Mini-Cog screen was completed. Maximum score is 22. A value of 0 denotes this part of the MMSE was not completed or the patient failed this part of the Mini-Cog screening.       Immunization History  Administered Date(s) Administered  . Fluad Quad(high Dose 65+) 04/24/2019  . Influenza,inj,Quad PF,6+ Mos 06/24/2017, 05/05/2018  . Influenza-Unspecified  05/12/2016  . PFIZER SARS-COV-2 Vaccination 09/15/2019, 10/06/2019  . Pneumococcal Conjugate-13 10/31/2017  . Pneumococcal Polysaccharide-23 04/03/2019  . Td 08/23/1996  . Tdap 05/23/2018    Qualifies for Shingles Vaccine: Yes  Screening Tests Health Maintenance  Topic Date Due  . HEMOGLOBIN A1C  10/04/2019  . URINE MICROALBUMIN  10/25/2019  . FOOT EXAM  10/27/2019  . OPHTHALMOLOGY EXAM  09/23/2020  . COLONOSCOPY  04/11/2022  . TETANUS/TDAP  05/23/2028  . INFLUENZA VACCINE  Completed  . Hepatitis C Screening  Completed  . PNA vac Low Risk Adult  Completed   Cancer Screenings: Lung: Low Dose CT Chest recommended if Age 46-80 years, 30 pack-year currently smoking OR have quit w/in 15 years. Patient does not qualify. Colorectal: completed 04/11/2012  Additional Screenings:  Hepatitis C Screening: 08/12/2010      Plan:   Patient will maintain and continue medications as prescribed.   I have personally reviewed and noted the following in the patient's chart:   . Medical and social history . Use of alcohol, tobacco or illicit drugs  . Current medications and supplements . Functional ability and status . Nutritional status . Physical activity . Advanced directives . List of other physicians . Hospitalizations, surgeries, and ER visits in previous 12 months . Vitals . Screenings to include cognitive, depression, and falls . Referrals and appointments  In addition, I have reviewed and discussed with patient certain preventive protocols, quality metrics, and best practice recommendations. A written personalized care plan for preventive services as well as general preventive health recommendations were provided to patient.     Andrez Grime, LPN  [id number]

## 2019-10-29 NOTE — Progress Notes (Signed)
PCP notes:  Health Maintenance: No gaps noted   Abnormal Screenings: none   Patient concerns: none   Nurse concerns: none   Next PCP appt.: 11/01/2019 @ 9:45 am

## 2019-11-01 ENCOUNTER — Ambulatory Visit (INDEPENDENT_AMBULATORY_CARE_PROVIDER_SITE_OTHER): Payer: Medicare Other | Admitting: Family Medicine

## 2019-11-01 ENCOUNTER — Other Ambulatory Visit: Payer: Self-pay

## 2019-11-01 ENCOUNTER — Encounter: Payer: Self-pay | Admitting: Family Medicine

## 2019-11-01 VITALS — BP 102/70 | HR 70 | Temp 96.4°F | Ht 71.0 in | Wt 214.2 lb

## 2019-11-01 DIAGNOSIS — Z794 Long term (current) use of insulin: Secondary | ICD-10-CM | POA: Diagnosis not present

## 2019-11-01 DIAGNOSIS — R42 Dizziness and giddiness: Secondary | ICD-10-CM

## 2019-11-01 DIAGNOSIS — Z7189 Other specified counseling: Secondary | ICD-10-CM

## 2019-11-01 DIAGNOSIS — Z0000 Encounter for general adult medical examination without abnormal findings: Secondary | ICD-10-CM

## 2019-11-01 DIAGNOSIS — E119 Type 2 diabetes mellitus without complications: Secondary | ICD-10-CM

## 2019-11-01 MED ORDER — METFORMIN HCL 500 MG PO TABS: 500.0000 mg | ORAL_TABLET | Freq: Every day | ORAL | Status: DC

## 2019-11-01 MED ORDER — PRAVASTATIN SODIUM 40 MG PO TABS: 40.0000 mg | ORAL_TABLET | Freq: Every evening | ORAL | 3 refills | Status: DC

## 2019-11-01 NOTE — Patient Instructions (Addendum)
Check with your insurance to see if they will cover the shingrix shot. Take 1 metformin at night and see how that goes.  You may need to increase your insulin a little.  Goal sugar 150 or slightly lower.  Recheck in about 3-4 months with A1c at the visit.  Keep working on diet and exercise.   Take care.  Glad to see you.  If you are going to change your insulin, you can increase by 1 unit a day.   Ask your insurance if any long acting (once daily) insulin will be cheaper.

## 2019-11-01 NOTE — Progress Notes (Signed)
This visit occurred during the SARS-CoV-2 public health emergency.  Safety protocols were in place, including screening questions prior to the visit, additional usage of staff PPE, and extensive cleaning of exam room while observing appropriate contact time as indicated for disinfecting solutions.  covid vaccine 2021.   Tetanus 2019 PNA up to date.  Flu 2020 shingrix d/w pt.   Smoking cessation d/w pt. A few cigs a day.   PSA wnl.  Pros and cons of testing d/w pt.   Colonoscopy 2013 If incapacitated, would have his wife designated.  Diet and exercise d/w pt.  "I fell off the wagon."  His father died in 09/05/2019.  Condolences offered.  His father was 13, had a CVA.    Diabetes:  Using medications without difficulties: see below re: metformin.   Hypoglycemic episodes:no Hyperglycemic episodes: no Feet problems: no Blood Sugars averaging: usually 130-170s.   eye exam within last year: yes He walked 4 miles on Sunday.  His knee pain is clearly better after prev injection per ortho. We talked about GI upset from metformin. See avs. Labs d/w pt.   Elevated Cholesterol: Using medications without problems:yes Muscle aches: no Diet compliance: encoruaged.   Exercise: encouraged His labs were not fasting.  D/w pt.   No CP, SOB, BLE edema.    He had some prev vertigo episodes.  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  He was in an awkward position under the car and had vertigo.  No LOC.  It resolved. He has been doing home exercises for vertigo with relief.    PMH and SH reviewed.   Vital signs, Meds and allergies reviewed.  ROS: Per HPI unless specifically indicated in ROS section   GEN: nad, alert and oriented HEENT: ncat NECK: supple w/o LA CV: rrr. PULM: ctab, no inc wob ABD: soft, +bs EXT: no edema SKIN: no acute rash  Diabetic foot exam: Normal inspection No skin breakdown No calluses  Normal DP pulses Normal sensation to light tough  and monofilament Nails normal

## 2019-11-04 DIAGNOSIS — Z0000 Encounter for general adult medical examination without abnormal findings: Secondary | ICD-10-CM | POA: Insufficient documentation

## 2019-11-04 NOTE — Assessment & Plan Note (Signed)
If incapacitated, would have his wife designated.

## 2019-11-04 NOTE — Assessment & Plan Note (Signed)
covid vaccine 2021.   Tetanus 2019 PNA up to date.  Flu 2020 shingrix d/w pt.   Smoking cessation d/w pt. A few cigs a day.   PSA wnl.  Pros and cons of testing d/w pt.   Colonoscopy 2013 If incapacitated, would have his wife designated.  Diet and exercise d/w pt.  "I fell off the wagon."

## 2019-11-04 NOTE — Assessment & Plan Note (Signed)
No symptoms currently.  We talked about using his HEP to control sx and he'll update me as needed.  He isn't lightheaded.

## 2019-11-04 NOTE — Assessment & Plan Note (Addendum)
Discussed diet and exercise. He walked 4 miles on Sunday.  His knee pain is clearly better after prev injection per ortho. We talked about GI upset from metformin. See avs. Labs d/w pt.  He will take 1 metformin at night and see how that goes.  He may need to increase his insulin a little.  Goal sugar 150 or slightly lower.  Recheck in about 3-4 months with A1c at the visit.   At least 30 minutes were devoted to patient care in this encounter (this can potentially include time spent reviewing the patient's file/history, interviewing and examining the patient, counseling/reviewing plan with patient, ordering referrals, ordering tests, reviewing relevant laboratory or x-ray data, and documenting the encounter).

## 2020-01-15 NOTE — Progress Notes (Signed)
Follow-up Outpatient Visit Date: 01/16/2020  Primary Care Provider: Tonia Ghent, MD [place] [address]  Chief Complaint: Dizziness  HPI:  Carl Barnes is a 69 y.o. male with history of nonobstructive coronary artery disease, hyperlipidemia, type 2 diabetes mellitus, and tobacco use, who presents for follow-up of CAD.  I last saw him in 06/2019, at which time his only complaint was of left leg pain.  No medication changes or additional testing were pursued.  Today, Carl Barnes reports feeling relatively well at except for episodic dizziness.  About once a week, he suddenly feels nauseated and dizzy, with things spinning around him.  On a few occasions, he has also felt as though he may pass out.  This is typically positional, such as when standing up from having worked under a car or when rolling in bed.  He is sometimes able to abort the episodes when he closes his eyes and concentrates on something.  He denies associated chest pain, shortness of breath, and palpitations.  He has not had any symptoms otherwise.  He is tolerating his medications well.  --------------------------------------------------------------------------------------------------  Past Medical History:  Diagnosis Date  . Cholangitis 12/11   [facility] admission - ERCP for stone removal per GI  . Diabetes mellitus without complication ([place])   . Elevated glucose 10/16 to 06/09/2004   [facility] admission, mild elevated cholesterol  . History of echocardiogram    a. 05/2017 Echo: EF 55-60%, mild LVH, no rwma, mild MR.  Marland Kitchen Hyperlipidemia 12/21/1997  . Morbid obesity ([place])   . Non-obstructive CAD (coronary artery disease)    a. 05/2016 Abnl St Test: intermediate risk, large, partially reversible inf/infsept defect consistent w/ ischemia/? scar;  b. 06/2016 Cath: LM nl, LAD 52m, RI nl, LCX nl, OM1/2/3 nl, RCA 40p/m, 30d, RPDA/RPAV nl.  . Poisoning by black widow spider bite 1979  . RBBB   . Renal stones   .  Tobacco abuse    Past Surgical History:  Procedure Laterality Date  . CARDIAC CATHETERIZATION Left 07/06/2016   Procedure: Left Heart Cath and Coronary Angiography;  Surgeon: Nelva Bush, MD;  Location: [facility];  Service: Cardiovascular;  Laterality: Left;  . Cardiolite  06/08/2004   WNL  . CATARACT EXTRACTION W/ INTRAOCULAR LENS IMPLANT Right 10/13/2017   Dr. Satira Sark  . CATARACT EXTRACTION W/ INTRAOCULAR LENS IMPLANT Left 09/08/2017   Dr.Tanner  . Chest CT w/contrast  06/08/04   Negative PE  . CHOLECYSTECTOMY  01/2011   Diseased GB.  [facility]  . Head MRI  12/99   Normal (R/O acoustic neuroma)    Current Meds  Medication Sig  . ACCU-CHEK AVIVA PLUS test strip CHECK SUGAR DAILY UP TO 3 TIMES DAILY. INSULIN TREATED DM2  . aspirin EC 81 MG tablet Take 1 tablet (81 mg total) by mouth daily.  . B-D UF III MINI PEN NEEDLES 31G X 5 MM MISC USE DAILY WITH PEN NEEDLE  . betamethasone dipropionate 0.05 % cream Apply 1 application topically as needed.  . calcipotriene (DOVONOX) 0.005 % cream Apply 1 application topically 2 (two) times daily as needed.  . cetirizine (ZYRTEC) 10 MG tablet Take 10 mg by mouth daily as needed (for seasonal allergies with lawn mowing).   Marland Kitchen ibuprofen (ADVIL,MOTRIN) 200 MG tablet Take 600 mg by mouth every 8 (eight) hours as needed (for pain.).  . Insulin Glargine (LANTUS SOLOSTAR) 100 UNIT/ML Solostar Pen Use 30 units once daily. SQ. DX:E11.9  . meclizine (ANTIVERT)  25 MG tablet Take 0.5-1 tablets (12.5-25 mg total) by mouth 3 (three) times daily as needed for dizziness.  . metFORMIN (GLUCOPHAGE) 500 MG tablet Take 1 tablet (500 mg total) by mouth daily.  . pravastatin (PRAVACHOL) 40 MG tablet Take 1 tablet (40 mg total) by mouth every evening.    Allergies: Chantix [varenicline], Codeine, and Metformin and related  Social History   Tobacco Use  . Smoking status: Current Every Day Smoker    Packs/day: 0.25    Years: 35.00    Pack years: 8.75     Types: Cigarettes    Last attempt to quit: 10/10/2017    Years since quitting: 2.2  . Smokeless tobacco: Never Used  Substance Use Topics  . Alcohol use: Yes    Alcohol/week: 2.0 standard drinks    Types: 2 Shots of liquor per week    Comment: socially  . Drug use: No    Family History  Problem Relation Age of Onset  . Cancer Mother        Pancreatic  . Hyperlipidemia Mother   . CVA Father   . Stroke Brother   . Heart disease Brother   . Heart disease Paternal Grandfather        MI  . Diabetes Neg Hx   . Alcohol abuse Neg Hx   . Drug abuse Neg Hx   . Depression Neg Hx   . Colon cancer Neg Hx   . Prostate cancer Neg Hx     Review of Systems: Carl Barnes inquires about hypopigmentation on his right shin, specifically if this could be related to PAD.  Otherwise, a 12-system review of systems was performed and was negative except as noted in the HPI.  --------------------------------------------------------------------------------------------------  Physical Exam: BP 116/80 (BP Location: Left Arm, Patient Position: Sitting, Cuff Size: Normal)   Pulse 66   Ht 5\' 11"  (1.803 m)   Wt 214 lb 4 oz (97.2 kg)   SpO2 98%   BMI 29.88 kg/m   General: NAD. Neck: No JVD or HJR. Lungs: Clear to auscultation without wheezes or crackles. Heart: Regular rate and rhythm without murmurs, rubs, or gallops. Abdomen: Soft, nontender, nondistended. Extremities: No lower extremity edema.  2+ radial and pedal pulses bilaterally. Skin: 3 hypopigmented patches consistent with vitiligo noted on the right calf.  Varicose veins also present.  EKG: Normal sinus rhythm with first-degree AV block, right bundle branch block, left axis deviation, and LVH.  Compared with prior tracing from 06/2019, RBBB has reported iron.  Lab Results  Component Value Date   WBC 7.6 06/23/2016   HGB 16.4 06/23/2016   HCT 47.3 06/23/2016   MCV 94 06/23/2016   PLT 159 06/23/2016    Lab Results  Component Value  Date   NA 137 10/29/2019   K 4.8 10/29/2019   CL 106 10/29/2019   CO2 27 10/29/2019   BUN 12 10/29/2019   CREATININE 0.94 10/29/2019   GLUCOSE 218 (H) 10/29/2019   ALT 25 10/29/2019    Lab Results  Component Value Date   CHOL 123 10/29/2019   HDL 30.10 (L) 10/29/2019   LDLCALC 59 10/29/2019   LDLDIRECT 57.0 10/18/2017   TRIG 168.0 (H) 10/29/2019   CHOLHDL 4 10/29/2019    --------------------------------------------------------------------------------------------------  ASSESSMENT AND PLAN: Dizziness and abnormal EKG: Dizziness is most consistent with vertigo, though given some sense of lightheadedness as well as underlying conduction disease, transient heart block is a consideration.  I have recommended that we obtain a 14-day  event monitor for further evaluation.  Nonobstructive coronary artery disease: Carl Barnes has been without symptoms to suggest worsening coronary insufficiency.  Continue aspirin and statin therapy to prevent progression of disease.  Hyperlipidemia: LDL well controlled.  Continue current dose of pravastatin.  Follow-up: Return to clinic in 6 months.  Nelva Bush, MD 01/16/2020 9:30 AM

## 2020-01-16 ENCOUNTER — Ambulatory Visit: Payer: Medicare Other | Admitting: Internal Medicine

## 2020-01-16 ENCOUNTER — Encounter: Payer: Self-pay | Admitting: Internal Medicine

## 2020-01-16 ENCOUNTER — Other Ambulatory Visit: Payer: Self-pay

## 2020-01-16 ENCOUNTER — Ambulatory Visit (INDEPENDENT_AMBULATORY_CARE_PROVIDER_SITE_OTHER): Payer: Medicare Other

## 2020-01-16 VITALS — BP 116/80 | HR 66 | Ht 71.0 in | Wt 214.2 lb

## 2020-01-16 DIAGNOSIS — R42 Dizziness and giddiness: Secondary | ICD-10-CM | POA: Diagnosis not present

## 2020-01-16 DIAGNOSIS — E785 Hyperlipidemia, unspecified: Secondary | ICD-10-CM

## 2020-01-16 DIAGNOSIS — I2510 Atherosclerotic heart disease of native coronary artery without angina pectoris: Secondary | ICD-10-CM

## 2020-01-16 DIAGNOSIS — R9431 Abnormal electrocardiogram [ECG] [EKG]: Secondary | ICD-10-CM

## 2020-01-16 NOTE — Patient Instructions (Signed)
Medication Instructions:  Your physician recommends that you continue on your current medications as directed. Please refer to the Current Medication list given to you today.  *If you need a refill on your cardiac medications before your next appointment, please call your pharmacy*   Lab Work: none If you have labs (blood work) drawn today and your tests are completely normal, you will receive your results only by: Marland Kitchen MyChart Message (if you have MyChart) OR . A paper copy in the mail If you have any lab test that is abnormal or we need to change your treatment, we will call you to review the results.   Testing/Procedures:  ZIO MONITOR FOR 14 DAYS - Remove on 01/30/2020. Your physician has recommended that you wear a Zio monitor. This monitor is a medical device that records the heart's electrical activity. Doctors most often use these monitors to diagnose arrhythmias. Arrhythmias are problems with the speed or rhythm of the heartbeat. The monitor is a small device applied to your chest. You can wear one while you do your normal daily activities. While wearing this monitor if you have any symptoms to push the button and record what you felt. Once you have worn this monitor for the period of time provider prescribed (Usually 14 days), you will return the monitor device in the postage paid box. Once it is returned they will download the data collected and provide [place] with a report which the provider will then review and we will call you with those results. Important tips:  1. Avoid showering during the first 24 hours of wearing the monitor. 2. Avoid excessive sweating to help maximize wear time. 3. Do not submerge the device, no hot tubs, and no swimming pools. 4. Keep any lotions or oils away from the patch. 5. After 24 hours you may shower with the patch on. Take brief showers with your back facing the shower head.  6. Do not remove patch once it has been placed because that will interrupt data  and decrease adhesive wear time. 7. Push the button when you have any symptoms and write down what you were feeling. 8. Once you have completed wearing your monitor, remove and place into box which has postage paid and place in your outgoing mailbox.  9. If for some reason you have misplaced your box then call our office and we can provide another box and/or mail it off for you.   Follow-Up: At [facility], you and your health needs are our priority.  As part of our continuing mission to provide you with exceptional heart care, we have created designated Provider Care Teams.  These Care Teams include your primary Cardiologist (physician) and Advanced Practice Providers (APPs -  Physician Assistants and Nurse Practitioners) who all work together to provide you with the care you need, when you need it.  We recommend signing up for the patient portal called "MyChart".  Sign up information is provided on this After Visit Summary.  MyChart is used to connect with patients for Virtual Visits (Telemedicine).  Patients are able to view lab/test results, encounter notes, upcoming appointments, etc.  Non-urgent messages can be sent to your provider as well.   To learn more about what you can do with MyChart, go to [email or web address].    Your next appointment:   6 month(s)  The format for your next appointment:   In Person  Provider:    You may see DR Harrell Gave END or one of the following Advanced Practice  Providers on your designated Care Team:    Murray Hodgkins, NP  Christell Faith, PA-C  Marrianne Mood, [place]

## 2020-01-17 ENCOUNTER — Other Ambulatory Visit: Payer: Self-pay | Admitting: Family Medicine

## 2020-02-01 ENCOUNTER — Other Ambulatory Visit: Payer: Self-pay

## 2020-02-01 ENCOUNTER — Encounter: Payer: Self-pay | Admitting: Family Medicine

## 2020-02-01 ENCOUNTER — Ambulatory Visit: Payer: Medicare Other | Admitting: Family Medicine

## 2020-02-01 VITALS — BP 130/66 | HR 71 | Temp 97.2°F | Ht 71.0 in | Wt 213.1 lb

## 2020-02-01 DIAGNOSIS — E119 Type 2 diabetes mellitus without complications: Secondary | ICD-10-CM | POA: Diagnosis not present

## 2020-02-01 DIAGNOSIS — Z794 Long term (current) use of insulin: Secondary | ICD-10-CM

## 2020-02-01 LAB — POCT GLYCOSYLATED HEMOGLOBIN (HGB A1C): Hemoglobin A1C: 7.8 % — AB (ref 4.0–5.6)

## 2020-02-01 MED ORDER — LANTUS SOLOSTAR 100 UNIT/ML ~~LOC~~ SOPN: PEN_INJECTOR | SUBCUTANEOUS | 3 refills | Status: DC

## 2020-02-01 MED ORDER — METFORMIN HCL 500 MG PO TABS: 500.0000 mg | ORAL_TABLET | Freq: Two times a day (BID) | ORAL | Status: DC

## 2020-02-01 NOTE — Progress Notes (Signed)
This visit occurred during the SARS-CoV-2 public health emergency.  Safety protocols were in place, including screening questions prior to the visit, additional usage of staff PPE, and extensive cleaning of exam room while observing appropriate contact time as indicated for disinfecting solutions.  Diabetes:  Using medications without difficulties: he is back to 500mg  BID on most days, taking 1 tab at night if he has GI upset.  He is taking 32 units daily.   Hypoglycemic episodes: no Hyperglycemic episodes:no Feet problems: no Blood Sugars averaging: 120-130s usually.  Rarely <100 eye exam within last year: yes A1c improved to 7.8.    No FCNAVD.    He had follow up cardiology and wore a monitor for 11 days.  He is better than prev with home exercises for vertigo.  I am awaiting report from home monitor.  occ lightheaded, noted when rolling over and working on a car, in an awkward position.  He states he is better.  No syncope.  He isn't lightheaded on standing.    Meds, vitals, and allergies reviewed.   ROS: Per HPI unless specifically indicated in ROS section   GEN: nad, alert and oriented HEENT: ncat NECK: supple w/o LA CV: rrr. PULM: ctab, no inc wob ABD: soft, +bs EXT: no edema SKIN: no acute rash

## 2020-02-01 NOTE — Patient Instructions (Addendum)
Your A1c is better.  Update me as needed.   Recheck in about ~3-4 months.  A1c at the visit.   Don't change your meds for now.  Take care.  Glad to see you. Thanks for your effort.

## 2020-02-03 NOTE — Assessment & Plan Note (Signed)
A1c improved.  He is taking Metformin twice a day on most days, cutting back to 1 a day if he has GI upset.  Taking 32 units a day.  Would continue as is for now.  Continue work on diet and exercise.  Recheck periodically.  He agrees.  I will await his cardiac monitor results in the meantime.  He is feeling better overall with home exercises for vertigo.  Routine cautions given to patient.

## 2020-02-19 ENCOUNTER — Telehealth: Payer: Self-pay

## 2020-02-19 NOTE — Telephone Encounter (Signed)
-----   Message from Nelva Bush, MD sent at 02/18/2020  5:20 PM EDT ----- Please let Mr. Skelly know that his event monitor showed rare extra beats but no significant arrhythmia to explain his dizziness.

## 2020-02-19 NOTE — Telephone Encounter (Signed)
Call to patient to review heart monitor results.    Left detailed message, okay per DPR.    Advised pt to call for any further questions or concerns.  No further orders.

## 2020-03-12 ENCOUNTER — Other Ambulatory Visit: Payer: Self-pay | Admitting: Family Medicine

## 2020-03-12 NOTE — Telephone Encounter (Signed)
Refill request Lantus solostar Refill request shows 30 units, med list shows 32.units Please confirm that directions have changed Last office visit 02/01/20

## 2020-03-12 NOTE — Telephone Encounter (Signed)
Updated rx.  Sent.  Thanks.

## 2020-04-21 ENCOUNTER — Other Ambulatory Visit: Payer: Self-pay | Admitting: Family Medicine

## 2020-06-02 ENCOUNTER — Ambulatory Visit: Payer: Medicare Other | Admitting: Family Medicine

## 2020-06-09 ENCOUNTER — Ambulatory Visit: Payer: Medicare Other | Admitting: Family Medicine

## 2020-06-12 ENCOUNTER — Ambulatory Visit: Payer: Medicare Other | Admitting: Family Medicine

## 2020-06-12 ENCOUNTER — Encounter: Payer: Self-pay | Admitting: Family Medicine

## 2020-06-12 ENCOUNTER — Other Ambulatory Visit: Payer: Self-pay

## 2020-06-12 VITALS — BP 122/68 | HR 58 | Temp 96.5°F | Ht 71.0 in | Wt 215.2 lb

## 2020-06-12 DIAGNOSIS — Z794 Long term (current) use of insulin: Secondary | ICD-10-CM | POA: Diagnosis not present

## 2020-06-12 DIAGNOSIS — E119 Type 2 diabetes mellitus without complications: Secondary | ICD-10-CM | POA: Diagnosis not present

## 2020-06-12 LAB — POCT GLYCOSYLATED HEMOGLOBIN (HGB A1C): Hemoglobin A1C: 8.2 % — AB (ref 4.0–5.6)

## 2020-06-12 NOTE — Progress Notes (Signed)
This visit occurred during the SARS-CoV-2 public health emergency.  Safety protocols were in place, including screening questions prior to the visit, additional usage of staff PPE, and extensive cleaning of exam room while observing appropriate contact time as indicated for disinfecting solutions.  Diabetes:  Using medications without difficulties: still on 32 units and 1 metformin a day.   Hypoglycemic episodes: no Hyperglycemic episodes: no Feet problems: no Blood Sugars averaging: usually ~120s eye exam within last year: yes A1c up to 8.2.  D/w pt.   He feels good o/w.    Meds, vitals, and allergies reviewed.  ROS: Per HPI unless specifically indicated in ROS section   GEN: nad, alert and oriented HEENT: ncat NECK: supple w/o LA CV: rrr. PULM: ctab, no inc wob ABD: soft, +bs EXT: no edema SKIN: no acute rash  Diabetic foot exam: Normal inspection No skin breakdown No calluses  Normal DP pulses Normal sensation to light touch and monofilament Nails normal except for 1st nail thick B

## 2020-06-12 NOTE — Patient Instructions (Addendum)
Don't change your meds for now.   Check your sugar about 2 hours after a meal and see if that provides some extra information.   If you keep having spikes then let me know.   Take care.  Glad to see you.  Plan on recheck with labs prior to a yearly visit in about 4 months but update me as needed in the meantime.

## 2020-06-15 NOTE — Assessment & Plan Note (Signed)
still on 32 units and 1 metformin a day.    A1c up to 8.2.  D/w pt.   He feels good o/w.    He will check his sugar later in the day/episodically to see if he notices postprandial sugar elevations.  He will continue work on diet and exercise.  If he notices significant hyperglycemia in the meantime that he can update me.  Otherwise we can recheck periodically.  See after visit summary.  He agrees.

## 2020-07-23 ENCOUNTER — Ambulatory Visit: Payer: Medicare Other | Admitting: Internal Medicine

## 2020-07-23 ENCOUNTER — Encounter: Payer: Self-pay | Admitting: Internal Medicine

## 2020-07-23 ENCOUNTER — Other Ambulatory Visit: Payer: Self-pay

## 2020-07-23 VITALS — BP 136/76 | HR 54 | Ht 72.0 in | Wt 214.0 lb

## 2020-07-23 DIAGNOSIS — R030 Elevated blood-pressure reading, without diagnosis of hypertension: Secondary | ICD-10-CM | POA: Diagnosis not present

## 2020-07-23 DIAGNOSIS — I2510 Atherosclerotic heart disease of native coronary artery without angina pectoris: Secondary | ICD-10-CM

## 2020-07-23 DIAGNOSIS — E782 Mixed hyperlipidemia: Secondary | ICD-10-CM | POA: Diagnosis not present

## 2020-07-23 NOTE — Patient Instructions (Signed)
Medication Instructions:  Your physician recommends that you continue on your current medications as directed. Please refer to the Current Medication list given to you today.  *If you need a refill on your cardiac medications before your next appointment, please call your pharmacy*  Follow-Up: At [facility], you and your health needs are our priority.  As part of our continuing mission to provide you with exceptional heart care, we have created designated Provider Care Teams.  These Care Teams include your primary Cardiologist (physician) and Advanced Practice Providers (APPs -  Physician Assistants and Nurse Practitioners) who all work together to provide you with the care you need, when you need it.  We recommend signing up for the patient portal called "MyChart".  Sign up information is provided on this After Visit Summary.  MyChart is used to connect with patients for Virtual Visits (Telemedicine).  Patients are able to view lab/test results, encounter notes, upcoming appointments, etc.  Non-urgent messages can be sent to your provider as well.   To learn more about what you can do with MyChart, go to [email or web address].    Your next appointment:   12 month(s)  The format for your next appointment:   In Person  Provider:   You may see DR CHRISTOPHER END or one of the following Advanced Practice Providers on your designated Care Team:    Christopher Berge, NP  Ryan Dunn, PA-C  Jacquelyn Visser, PA-C  Cadence Furth, PA-C  Caitlin Walker, NP  

## 2020-07-23 NOTE — Progress Notes (Signed)
Follow-up Outpatient Visit Date: 07/23/2020  Primary Care Provider: Tonia Ghent, MD [place] [address]  Chief Complaint: Follow-up coronary artery disease and dizziness  HPI:  Carl Barnes is a 69 y.o. male with history of nonobstructive CAD, HLD, DM2, and tobacco use, who presents for follow-up of CAD.  I last saw Carl Barnes in late May, at which time he reported episodic dizziness, occurring about once a week.  Episodes were most consistent with vertigo.  However, given some associated lightheadedness, we agreed to obtain an event monitor that showed rare PVCs and PACs as well as 2 brief atrial runs lasting up to 5 beats.  Today, Carl Barnes reports that he has been feeling quite well.  He denies further episodes of dizziness.  He also denies chest pain, shortness of breath, palpitations, and edema.  His wife checks his blood pressure once a week; he does not recall specific values but thinks that his blood pressure is typically "good."  He is tolerating his medications well, though he has a long history of frequent bowel movements related to Metformin.  --------------------------------------------------------------------------------------------------  Past Medical History:  Diagnosis Date  . Cholangitis 12/11   [facility] [address] admission - ERCP for stone removal per GI  . Diabetes mellitus without complication ([place])   . Elevated glucose 10/16 to 06/09/2004   [place] admission, mild elevated cholesterol  . History of echocardiogram    a. 05/2017 Echo: EF 55-60%, mild LVH, no rwma, mild MR.  Marland Kitchen Hyperlipidemia 12/21/1997  . Morbid obesity ([place])   . Non-obstructive CAD (coronary artery disease)    a. 05/2016 Abnl St Test: intermediate risk, large, partially reversible inf/infsept defect consistent w/ ischemia/? scar;  b. 06/2016 Cath: LM nl, LAD 48m, RI nl, LCX nl, OM1/2/3 nl, RCA 40p/m, 30d, RPDA/RPAV nl.  . Poisoning by black widow spider bite 1979  . RBBB   . Renal stones     . Tobacco abuse    Past Surgical History:  Procedure Laterality Date  . CARDIAC CATHETERIZATION Left 07/06/2016   Procedure: Left Heart Cath and Coronary Angiography;  Surgeon: Nelva Bush, MD;  Location: [facility];  Service: Cardiovascular;  Laterality: Left;  . Cardiolite  06/08/2004   WNL  . CATARACT EXTRACTION W/ INTRAOCULAR LENS IMPLANT Right 10/13/2017   Dr. Satira Sark  . CATARACT EXTRACTION W/ INTRAOCULAR LENS IMPLANT Left 09/08/2017   Dr.Tanner  . Chest CT w/contrast  06/08/04   Negative PE  . CHOLECYSTECTOMY  01/2011   Diseased GB.  [facility]  . Head MRI  12/99   Normal (R/O acoustic neuroma)    Current Meds  Medication Sig  . ACCU-CHEK AVIVA PLUS test strip CHECK SUGAR DAILY UP TO 3 TIMES DAILY. INSULIN TREATED DM2  . aspirin EC 81 MG tablet Take 1 tablet (81 mg total) by mouth daily.  . betamethasone dipropionate 0.05 % cream Apply 1 application topically as needed.  . calcipotriene (DOVONOX) 0.005 % cream Apply 1 application topically 2 (two) times daily as needed.  . cetirizine (ZYRTEC) 10 MG tablet Take 10 mg by mouth daily as needed (for seasonal allergies with lawn mowing).   Marland Kitchen ibuprofen (ADVIL,MOTRIN) 200 MG tablet Take 600 mg by mouth every 8 (eight) hours as needed (for pain.).  Marland Kitchen insulin glargine (LANTUS SOLOSTAR) 100 UNIT/ML Solostar Pen USE 32 UNITS ONCE DAILY  . metFORMIN (GLUCOPHAGE) 500 MG tablet Take 1 tablet (500 mg total) by mouth in the morning and at bedtime.  . pravastatin (  PRAVACHOL) 40 MG tablet Take 1 tablet (40 mg total) by mouth every evening.  . TRUEPLUS PEN NEEDLES 31G X 5 MM MISC USE DAILY WITH INSULIN PEN    Allergies: Chantix [varenicline], Codeine, and Metformin and related  Social History   Tobacco Use  . Smoking status: Current Every Day Smoker    Packs/day: 0.25    Years: 35.00    Pack years: 8.75    Types: Cigarettes    Last attempt to quit: 10/10/2017    Years since quitting: 2.7  . Smokeless tobacco: Never Used   Vaping Use  . Vaping Use: Former  Substance Use Topics  . Alcohol use: Yes    Alcohol/week: 2.0 standard drinks    Types: 2 Shots of liquor per week    Comment: socially  . Drug use: No    Family History  Problem Relation Age of Onset  . Cancer Mother        Pancreatic  . Hyperlipidemia Mother   . CVA Father   . Stroke Brother   . Heart disease Brother   . Heart disease Paternal Grandfather        MI  . Diabetes Neg Hx   . Alcohol abuse Neg Hx   . Drug abuse Neg Hx   . Depression Neg Hx   . Colon cancer Neg Hx   . Prostate cancer Neg Hx     Review of Systems: A 12-system review of systems was performed and was negative except as noted in the HPI.  --------------------------------------------------------------------------------------------------  Physical Exam: BP 136/76 (BP Location: Left Arm, Patient Position: Sitting, Cuff Size: Normal)   Pulse (!) 54   Ht 6' (1.829 m)   Wt 214 lb (97.1 kg)   SpO2 98%   BMI 29.02 kg/m   General: NAD. Neck: No JVD or HJR. Lungs: Clear to auscultation bilateral without wheezes or crackles. Heart: Bradycardic but regular without murmurs, rubs, or gallops. Abdomen: Soft, nontender, nondistended. Extremities: No lower extremity edema.  EKG: Sinus bradycardia (heart rate 54 bpm) with left axis deviation, right bundle branch block, and borderline LVH.  Lab Results  Component Value Date   WBC 7.6 06/23/2016   HGB 16.4 06/23/2016   HCT 47.3 06/23/2016   MCV 94 06/23/2016   PLT 159 06/23/2016    Lab Results  Component Value Date   NA 137 10/29/2019   K 4.8 10/29/2019   CL 106 10/29/2019   CO2 27 10/29/2019   BUN 12 10/29/2019   CREATININE 0.94 10/29/2019   GLUCOSE 218 (H) 10/29/2019   ALT 25 10/29/2019    Lab Results  Component Value Date   CHOL 123 10/29/2019   HDL 30.10 (L) 10/29/2019   LDLCALC 59 10/29/2019   LDLDIRECT 57.0 10/18/2017   TRIG 168.0 (H) 10/29/2019   CHOLHDL 4 10/29/2019     --------------------------------------------------------------------------------------------------  ASSESSMENT AND PLAN: Nonobstructive coronary artery disease: No symptoms to suggest worsening coronary insufficiency.  Continue current medications for secondary prevention including aspirin and pravastatin.  Elevated blood pressure: Blood pressure mildly elevated today (goal less than 130/80) though typically better at home and on prior checks.  We will defer adding antihypertensive therapy today.  I have encouraged Mr. Bonfield to continue to exercise and to minimize his sodium intake.  He should let us know if his blood pressures consistently above 130/80.  Hyperlipidemia: LDL at goal on last check in 10/2019.  Continue pravastatin with scheduled follow-up lipid panel through Dr. Josefine Class office in the next few  weeks.  Follow-up: Return to clinic in 1 year.  Nelva Bush, MD 07/23/2020 10:11 AM

## 2020-09-24 ENCOUNTER — Other Ambulatory Visit: Payer: Self-pay | Admitting: Family Medicine

## 2020-09-24 DIAGNOSIS — Z794 Long term (current) use of insulin: Secondary | ICD-10-CM

## 2020-09-24 DIAGNOSIS — Z125 Encounter for screening for malignant neoplasm of prostate: Secondary | ICD-10-CM

## 2020-10-06 ENCOUNTER — Other Ambulatory Visit (INDEPENDENT_AMBULATORY_CARE_PROVIDER_SITE_OTHER): Payer: Medicare Other

## 2020-10-06 ENCOUNTER — Other Ambulatory Visit: Payer: Self-pay

## 2020-10-06 DIAGNOSIS — Z125 Encounter for screening for malignant neoplasm of prostate: Secondary | ICD-10-CM

## 2020-10-06 DIAGNOSIS — E119 Type 2 diabetes mellitus without complications: Secondary | ICD-10-CM

## 2020-10-06 DIAGNOSIS — Z794 Long term (current) use of insulin: Secondary | ICD-10-CM | POA: Diagnosis not present

## 2020-10-06 LAB — COMPREHENSIVE METABOLIC PANEL
ALT: 23 U/L (ref 0–53)
AST: 16 U/L (ref 0–37)
Albumin: 4 g/dL (ref 3.5–5.2)
Alkaline Phosphatase: 53 U/L (ref 39–117)
BUN: 11 mg/dL (ref 6–23)
CO2: 27 mEq/L (ref 19–32)
Calcium: 9 mg/dL (ref 8.4–10.5)
Chloride: 105 mEq/L (ref 96–112)
Creatinine, Ser: 0.91 mg/dL (ref 0.40–1.50)
GFR: 85.39 mL/min (ref >60.00)
Glucose, Bld: 251 mg/dL — ABNORMAL HIGH (ref 70–99)
Potassium: 4.3 mEq/L (ref 3.5–5.1)
Sodium: 139 mEq/L (ref 135–145)
Total Bilirubin: 0.5 mg/dL (ref 0.2–1.2)
Total Protein: 6.5 g/dL (ref 6.0–8.3)

## 2020-10-06 LAB — LIPID PANEL
Cholesterol: 120 mg/dL (ref 0–200)
HDL: 28.7 mg/dL — ABNORMAL LOW (ref >39.00)
NonHDL: 90.83
Total CHOL/HDL Ratio: 4
Triglycerides: 259 mg/dL — ABNORMAL HIGH (ref 0.0–149.0)
VLDL: 51.8 mg/dL — ABNORMAL HIGH (ref 0.0–40.0)

## 2020-10-06 LAB — MICROALBUMIN / CREATININE URINE RATIO
Creatinine,U: 169.2 mg/dL
Microalb Creat Ratio: 0.8 mg/g (ref 0.0–30.0)
Microalb, Ur: 1.4 mg/dL (ref 0.0–1.9)

## 2020-10-06 LAB — LDL CHOLESTEROL, DIRECT: Direct LDL: 60 mg/dL

## 2020-10-06 LAB — HEMOGLOBIN A1C: Hgb A1c MFr Bld: 9.4 % — ABNORMAL HIGH (ref 4.6–6.5)

## 2020-10-06 LAB — PSA, MEDICARE: PSA: 0.31 ng/ml (ref 0.10–4.00)

## 2020-10-09 LAB — HM DIABETES EYE EXAM

## 2020-10-13 ENCOUNTER — Encounter: Payer: Medicare Other | Admitting: Family Medicine

## 2020-10-16 ENCOUNTER — Other Ambulatory Visit: Payer: Self-pay

## 2020-10-16 ENCOUNTER — Encounter: Payer: Self-pay | Admitting: Family Medicine

## 2020-10-16 ENCOUNTER — Ambulatory Visit: Payer: Medicare Other | Admitting: Family Medicine

## 2020-10-16 VITALS — BP 120/60 | HR 74 | Temp 97.8°F | Ht 72.0 in | Wt 216.0 lb

## 2020-10-16 DIAGNOSIS — E119 Type 2 diabetes mellitus without complications: Secondary | ICD-10-CM | POA: Diagnosis not present

## 2020-10-16 DIAGNOSIS — Z794 Long term (current) use of insulin: Secondary | ICD-10-CM | POA: Diagnosis not present

## 2020-10-16 DIAGNOSIS — E782 Mixed hyperlipidemia: Secondary | ICD-10-CM | POA: Diagnosis not present

## 2020-10-16 DIAGNOSIS — Z0000 Encounter for general adult medical examination without abnormal findings: Secondary | ICD-10-CM

## 2020-10-16 DIAGNOSIS — Z7189 Other specified counseling: Secondary | ICD-10-CM

## 2020-10-16 NOTE — Progress Notes (Signed)
This visit occurred during the SARS-CoV-2 public health emergency.  Safety protocols were in place, including screening questions prior to the visit, additional usage of staff PPE, and extensive cleaning of exam room while observing appropriate contact time as indicated for disinfecting solutions.  I have personally reviewed the Medicare Annual Wellness questionnaire and have noted 1. The patient's medical and social history 2. Their use of alcohol, tobacco or illicit drugs 3. Their current medications and supplements 4. The patient's functional ability including ADL's, fall risks, home safety risks and hearing or visual             impairment. 5. Diet and physical activities 6. Evidence for depression or mood disorders  The patients weight, height, BMI have been recorded in the chart and visual acuity is per eye clinic.  I have made referrals, counseling and provided education to the patient based review of the above and I have provided the pt with a written personalized care plan for preventive services.  Provider list updated- see scanned forms.  Routine anticipatory guidance given to patient.  See health maintenance. The possibility exists that previously documented standard health maintenance information may have been brought forward from a previous encounter into this note.  If needed, that same information has been updated to reflect the current situation based on today's encounter.    Flu 2021 Shingles discussed with patient PNA up-to-date Tetanus 2019 COVID vaccine up-to-date. Colon cancer screening done with colonoscopy 2013 Prostate cancer screening 2022 Advance directive-wife designated if patient were incapacitated. Cognitive function addressed- see scanned forms- and if abnormal then additional documentation follows.   He is back on nicotine gum, smoking 2 cigs a day, cutting back.  I encouraged him.    Diabetes:  Using medications without difficulties: 32 units insulin and  500mg  metformin- he usually can tolerate that dose of metformin w/o GI troubles Hypoglycemic episodes: no Hyperglycemic episodes: no Feet problems: no Blood Sugars averaging: prev 120-140 but noted higher spikes after meals eye exam within last year: yes  Elevated Cholesterol: Using medications without problems: yes Muscle aches:  Some aches, not prev attributed to statin.   Diet compliance: d/w pt.   Exercise: d/w pt.    PMH and SH reviewed  Meds, vitals, and allergies reviewed.   ROS: Per HPI.  Unless specifically indicated otherwise in HPI, the patient denies:  General: fever. Eyes: acute vision changes ENT: sore throat Cardiovascular: chest pain Respiratory: SOB GI: vomiting GU: dysuria Musculoskeletal: acute back pain Derm: acute rash Neuro: acute motor dysfunction Psych: worsening mood Endocrine: polydipsia Heme: bleeding Allergy: hayfever  GEN: nad, alert and oriented HEENT: ncat NECK: supple w/o LA CV: rrr. PULM: ctab, no inc wob ABD: soft, +bs EXT: no edema SKIN: no acute rash

## 2020-10-16 NOTE — Patient Instructions (Signed)
Let me see about diabetes options. We'll be in touch.   Plan on recheck in about 3 months with A1c at the visit.  Take care.  Glad to see you.

## 2020-10-19 ENCOUNTER — Telehealth: Payer: Self-pay | Admitting: Family Medicine

## 2020-10-19 DIAGNOSIS — Z0000 Encounter for general adult medical examination without abnormal findings: Secondary | ICD-10-CM | POA: Insufficient documentation

## 2020-10-19 MED ORDER — DAPAGLIFLOZIN PROPANEDIOL 5 MG PO TABS: 5.0000 mg | ORAL_TABLET | Freq: Every day | ORAL | 5 refills | Status: DC

## 2020-10-19 NOTE — Assessment & Plan Note (Signed)
See follow-up phone note.  Continue work on diet and exercise.  No change in medications at this point, continue insulin

## 2020-10-19 NOTE — Telephone Encounter (Signed)
Please update patient.  I would continue his baseline medications but see if he can add on 5 mg of Farxiga daily.  Make sure he drinks plenty of fluid when he takes it.  It will cause him to urinate out more sugar.  If he gets any irritation/urinary symptoms with this then stop it and let me know.  If he cannot afford it then let me know.  I would try that and recheck his A1c in about 3 months.  Thanks.  I sent the prescription.

## 2020-10-19 NOTE — Assessment & Plan Note (Signed)
Continue pravastatin.  Continue work on diet and exercise.  He agrees.

## 2020-10-19 NOTE — Assessment & Plan Note (Signed)
Advance directive- wife designated if patient were incapacitated.  

## 2020-10-19 NOTE — Assessment & Plan Note (Signed)
Flu 2021 Shingles discussed with patient PNA up-to-date Tetanus 2019 COVID vaccine up-to-date. Colon cancer screening done with colonoscopy 2013 Prostate cancer screening 2022 Advance directive-wife designated if patient were incapacitated. Cognitive function addressed- see scanned forms- and if abnormal then additional documentation follows.   He is back on nicotine gum, smoking 2 cigs a day, cutting back.  I encouraged him.

## 2020-10-20 NOTE — Telephone Encounter (Signed)
Spoke with patient about below medication. Patient agrees to start this and will pick it up today or tomorrow. Advised to call back if develops any urinary sx or can not afford rx.

## 2020-11-24 ENCOUNTER — Other Ambulatory Visit: Payer: Self-pay | Admitting: Family Medicine

## 2020-12-15 ENCOUNTER — Other Ambulatory Visit: Payer: Self-pay | Admitting: Family Medicine

## 2021-05-05 ENCOUNTER — Other Ambulatory Visit: Payer: Self-pay | Admitting: Family Medicine

## 2021-10-13 LAB — HM DIABETES EYE EXAM

## 2021-10-16 ENCOUNTER — Telehealth: Payer: Self-pay | Admitting: Family Medicine

## 2021-10-19 ENCOUNTER — Other Ambulatory Visit: Payer: Self-pay | Admitting: Family Medicine

## 2021-10-19 NOTE — Telephone Encounter (Signed)
Rx sent already

## 2021-10-19 NOTE — Telephone Encounter (Signed)
°  Encourage patient to contact the pharmacy for refills or they can request refills through [facility]:  Please schedule appointment if longer than 1 year  NEXT APPOINTMENT DATE: 11/25/21  MEDICATION: metFORMIN (GLUCOPHAGE) 500 MG tablet  Is the patient out of medication? yes  [facility]  Let patient know to contact pharmacy at the end of the day to make sure medication is ready.  Please notify patient to allow 48-72 hours to process  CLINICAL FILLS OUT ALL BELOW:   LAST REFILL:  QTY:  REFILL DATE:    OTHER COMMENTS:    Okay for refill?  Please advise

## 2021-10-28 ENCOUNTER — Other Ambulatory Visit: Payer: Self-pay

## 2021-10-28 ENCOUNTER — Encounter: Payer: Self-pay | Admitting: Nurse Practitioner

## 2021-10-28 ENCOUNTER — Ambulatory Visit
Admission: RE | Admit: 2021-10-28 | Discharge: 2021-10-28 | Disposition: A | Discharge status: Home / Self Care | Payer: Medicare Other | Source: Ambulatory Visit | Attending: Nurse Practitioner | Admitting: Nurse Practitioner

## 2021-10-28 ENCOUNTER — Ambulatory Visit: Payer: Medicare Other | Admitting: Nurse Practitioner

## 2021-10-28 VITALS — BP 128/62 | HR 64 | Temp 97.5°F | Resp 12 | Ht 72.0 in | Wt 207.4 lb

## 2021-10-28 DIAGNOSIS — K4090 Unilateral inguinal hernia, without obstruction or gangrene, not specified as recurrent: Secondary | ICD-10-CM | POA: Insufficient documentation

## 2021-10-28 NOTE — Progress Notes (Signed)
Acute Office Visit  Subjective:    Patient ID: Carl Barnes, male    DOB: November 27, 1950, 71 y.o.   MRN: [id number]  Chief Complaint  Patient presents with   Swelling in the groin    Right side, noticed it about 3 weeks ago. Swelling goes away off and on. Soreness is present. No nausea or vomiting.    HPI Patient is in today for   Noticed swelling on the right groin approx 3 weeks ago. States that it did ease off and went down States that Monday he was cutting down a tree. He was picking up the logs to put on the log splitter. Painful to the touch and red. No nausea vomiting or fever. No change in bowel habits. No history of hernia. Has been taking his prescribed gabapentin and ibuprofen with some relief.   Past Medical History:  Diagnosis Date   Cholangitis 12/11   [facility] admission - ERCP for stone removal per GI   Diabetes mellitus without complication ([place])    Elevated glucose 10/16 to 06/09/2004   [facility] admission, mild elevated cholesterol   History of echocardiogram    a. 05/2017 Echo: EF 55-60%, mild LVH, no rwma, mild MR.   Hyperlipidemia 12/21/1997   Morbid obesity ([place])    Non-obstructive CAD (coronary artery disease)    a. 05/2016 Abnl St Test: intermediate risk, large, partially reversible inf/infsept defect consistent w/ ischemia/? scar;  b. 06/2016 Cath: LM nl, LAD 6m RI nl, LCX nl, OM1/2/3 nl, RCA 40p/m, 30d, RPDA/RPAV nl.   Poisoning by black widow spider bite 1979   RBBB    Renal stones    Tobacco abuse     Past Surgical History:  Procedure Laterality Date   CARDIAC CATHETERIZATION Left 07/06/2016   Procedure: Left Heart Cath and Coronary Angiography;  Surgeon: CNelva Bush MD;  [facility];  Service: Cardiovascular;  Laterality: Left;   Cardiolite  06/08/2004   WNL   CATARACT EXTRACTION W/ INTRAOCULAR LENS IMPLANT Right 10/13/2017   Dr. TSatira Sark  CATARACT EXTRACTION W/ INTRAOCULAR LENS IMPLANT Left 09/08/2017   Dr.Tanner   Chest CT  w/contrast  06/08/04   Negative PE   CHOLECYSTECTOMY  01/2011   Diseased GB.  [facility]   Head MRI  12/99   Normal (R/O acoustic neuroma)    Family History  Problem Relation Age of Onset   Cancer Mother        Pancreatic   Hyperlipidemia Mother    CVA Father    Stroke Brother    Heart disease Brother    Heart disease Paternal Grandfather        MI   Diabetes Neg Hx    Alcohol abuse Neg Hx    Drug abuse Neg Hx    Depression Neg Hx    Colon cancer Neg Hx    Prostate cancer Neg Hx     Social History   Socioeconomic History   Marital status: Married    Spouse name: Not on file   Number of children: 1   Years of education: Not on file   Highest education level: Not on file  Occupational History   Occupation: PLANT SUPERVISOR    Employer: UNEMPLOYED    Comment: WElectronics engineer Tobacco Use   Smoking status: Every Day    Packs/day: 0.25    Years: 35.00    Pack years: 8.75    Types: Cigarettes    Last attempt to quit: 10/10/2017  Years since quitting: 4.0   Smokeless tobacco: Never  Vaping Use   Vaping Use: Former  Substance and Sexual Activity   Alcohol use: Yes    Alcohol/week: 2.0 standard drinks    Types: 2 Shots of liquor per week    Comment: socially   Drug use: No   Sexual activity: Not on file  Other Topics Concern   Not on file  Social History Narrative   Married, 2nd marriage 1997   1 adult son   Retired Librarian, academic at [employer] and from [employer], [place] vet, [employer], no service related issues   Tourist information centre manager   Social Determinants of Radio broadcast assistant Strain: Not on [employer] Insecurity: Not on file  Transportation Needs: Not on file  Physical Activity: Not on file  Stress: Not on file  Social Connections: Not on file  Intimate Partner Violence: Not on file    Outpatient Medications Prior to Visit  Medication Sig Dispense Refill   ACCU-CHEK AVIVA PLUS test strip CHECK SUGAR DAILY UP TO 3 TIMES DAILY. INSULIN  TREATED DM2 100 each 12   aspirin EC 81 MG tablet Take 1 tablet (81 mg total) by mouth daily. 90 tablet 3   B-D UF III MINI PEN NEEDLES 31G X 5 MM MISC USE DAILY WITH INSULIN PEN 100 each 3   betamethasone dipropionate 0.05 % cream Apply 1 application topically as needed.     calcipotriene (DOVONOX) 0.005 % cream Apply 1 application topically 2 (two) times daily as needed.     cetirizine (ZYRTEC) 10 MG tablet Take 10 mg by mouth daily as needed (for seasonal allergies with lawn mowing).      gabapentin (NEURONTIN) 600 MG tablet Take 1 tablet by mouth in the morning and at bedtime.     ibuprofen (ADVIL,MOTRIN) 200 MG tablet Take 600 mg by mouth every 8 (eight) hours as needed (for pain.).     insulin glargine (LANTUS SOLOSTAR) 100 UNIT/ML Solostar Pen INJECT 32 UNITS UNDER THE SKIN ONCE DAILY 15 mL 3   metFORMIN (GLUCOPHAGE) 500 MG tablet TAKE 1 TABLET BY MOUTH AT BEDTIME 90 tablet 0   pravastatin (PRAVACHOL) 40 MG tablet TAKE 1 TABLET BY MOUTH EVERY NIGHT AT BEDTIME 90 tablet 3   dapagliflozin propanediol (FARXIGA) 5 MG TABS tablet Take 1 tablet (5 mg total) by mouth daily before breakfast. 30 tablet 5   No facility-administered medications prior to visit.    Allergies  Allergen Reactions   Chantix [Varenicline] Other (See Comments)    intolerant   Codeine Other (See Comments)    REACTION: gi upset   Metformin And Related Other (See Comments)    Max dose '500mg'$ -'1000mg'$  a day    Review of Systems  Constitutional:  Negative for chills and fever.  Respiratory:  Negative for cough and shortness of breath.   Cardiovascular:  Negative for chest pain.  Gastrointestinal:  Negative for abdominal pain, diarrhea, nausea and vomiting.  Genitourinary:  Negative for scrotal swelling and testicular pain.      Objective:    Physical Exam Vitals and nursing note reviewed. Exam conducted with a chaperone present [facility] [place], RMA).  Constitutional:      Appearance: He is obese.   Cardiovascular:     Rate and Rhythm: Normal rate and regular rhythm.     Heart sounds: Normal heart sounds.  Pulmonary:     Effort: Pulmonary effort is normal.     Breath sounds:  Normal breath sounds.  Abdominal:     General: Bowel sounds are normal. There is no distension.     Palpations: There is no mass.     Tenderness: There is no abdominal tenderness.     Hernia: A hernia is present. Hernia is present in the right inguinal area. There is no hernia in the left inguinal area.  Genitourinary:    Penis: Normal and circumcised.      Testes:        Right: Mass and tenderness present.     Epididymis:     Right: Normal.       Comments: Mass felt in scrotum likely hernia. It can be traced up to the pubic area. Could not reduce in office Lymphadenopathy:     Lower Body: No right inguinal adenopathy. No left inguinal adenopathy.  Neurological:     Mental Status: He is alert.    BP 128/62    Pulse 64    Temp (!) 97.5 F (36.4 C)    Resp 12    Ht 6' (1.829 m)    Wt 207 lb 6 oz (94.1 kg)    SpO2 95%    BMI 28.13 kg/m  Wt Readings from Last 3 Encounters:  10/28/21 207 lb 6 oz (94.1 kg)  10/16/20 216 lb (98 kg)  07/23/20 214 lb (97.1 kg)    Health Maintenance Due  Topic Date Due   Zoster Vaccines- Shingrix (1 of 2) Never done   COVID-19 Vaccine (4 - Booster for Pfizer series) 08/12/2020   HEMOGLOBIN A1C  04/05/2021   FOOT EXAM  06/12/2021   URINE MICROALBUMIN  10/06/2021    There are no preventive care reminders to display for this patient.   Lab Results  Component Value Date   TSH 4.449 08/12/2010   Lab Results  Component Value Date   WBC 7.6 06/23/2016   HGB 16.4 06/23/2016   HCT 47.3 06/23/2016   MCV 94 06/23/2016   PLT 159 06/23/2016   Lab Results  Component Value Date   NA 139 10/06/2020   K 4.3 10/06/2020   CO2 27 10/06/2020   GLUCOSE 251 (H) 10/06/2020   BUN 11 10/06/2020   CREATININE 0.91 10/06/2020   BILITOT 0.5 10/06/2020   ALKPHOS 53 10/06/2020    AST 16 10/06/2020   ALT 23 10/06/2020   PROT 6.5 10/06/2020   ALBUMIN 4.0 10/06/2020   CALCIUM 9.0 10/06/2020   GFR 85.39 10/06/2020   Lab Results  Component Value Date   CHOL 120 10/06/2020   Lab Results  Component Value Date   HDL 28.70 (L) 10/06/2020   Lab Results  Component Value Date   LDLCALC 59 10/29/2019   Lab Results  Component Value Date   TRIG 259.0 (H) 10/06/2020   Lab Results  Component Value Date   CHOLHDL 4 10/06/2020   Lab Results  Component Value Date   HGBA1C 9.4 (H) 10/06/2020       Assessment & Plan:   Problem List Items Addressed This Visit       Other   Non-recurrent unilateral inguinal hernia without obstruction or gangrene - Primary    Patient presents 3-day history of right inguinal/scrotal hernia.  Patient states hurting most of the time can get a little relief with ibuprofen and gabapentin.  Patient noticed this after he was lifting heavy logs. No history of of hernias.  On exam I am concerned because of noninducibility and possible incarceration of hernia.  Will order stat  CT abdomen pelvis without contrast.  Signs and symptoms reviewed and when to seek emergent health care.  Pending CT result      Relevant Orders   CT Abdomen Pelvis Wo Contrast     No orders of the defined types were placed in this encounter.  This visit occurred during the SARS-CoV-2 public health emergency.  Safety protocols were in place, including screening questions prior to the visit, additional usage of staff PPE, and extensive cleaning of exam room while observing appropriate contact time as indicated for disinfecting solutions.   Romilda Garret, NP

## 2021-10-28 NOTE — Assessment & Plan Note (Signed)
Patient presents 3-day history of right inguinal/scrotal hernia.  Patient states hurting most of the time can get a little relief with ibuprofen and gabapentin.  Patient noticed this after he was lifting heavy logs. No history of of hernias.  On exam I am concerned because of noninducibility and possible incarceration of hernia.  Will order stat CT abdomen pelvis without contrast.  Signs and symptoms reviewed and when to seek emergent health care.  Pending CT result ?

## 2021-10-28 NOTE — Patient Instructions (Addendum)
Nice to see you today ?I will be in touch with the CT scan results once I get them ?If it becomes more painful, you start throwing up, start having a fever, or bowel movements change. Go to the emergency department ? ? ?CT scan at: ? ? ? ?

## 2021-10-30 ENCOUNTER — Emergency Department (HOSPITAL_BASED_OUTPATIENT_CLINIC_OR_DEPARTMENT_OTHER): Payer: Medicare Other

## 2021-10-30 ENCOUNTER — Other Ambulatory Visit: Payer: Self-pay

## 2021-10-30 ENCOUNTER — Inpatient Hospital Stay (HOSPITAL_BASED_OUTPATIENT_CLINIC_OR_DEPARTMENT_OTHER)
Admission: EM | Admit: 2021-10-30 | Discharge: 2021-11-01 | DRG: 717 | Disposition: A | Discharge status: Home Health | Payer: Medicare Other | Attending: Internal Medicine | Admitting: Internal Medicine

## 2021-10-30 ENCOUNTER — Encounter (HOSPITAL_BASED_OUTPATIENT_CLINIC_OR_DEPARTMENT_OTHER): Payer: Self-pay | Admitting: Emergency Medicine

## 2021-10-30 DIAGNOSIS — Z8249 Family history of ischemic heart disease and other diseases of the circulatory system: Secondary | ICD-10-CM

## 2021-10-30 DIAGNOSIS — Z87442 Personal history of urinary calculi: Secondary | ICD-10-CM

## 2021-10-30 DIAGNOSIS — R4922 Hyponasality: Secondary | ICD-10-CM

## 2021-10-30 DIAGNOSIS — Z794 Long term (current) use of insulin: Secondary | ICD-10-CM

## 2021-10-30 DIAGNOSIS — Z7984 Long term (current) use of oral hypoglycemic drugs: Secondary | ICD-10-CM

## 2021-10-30 DIAGNOSIS — Z83438 Family history of other disorder of lipoprotein metabolism and other lipidemia: Secondary | ICD-10-CM

## 2021-10-30 DIAGNOSIS — Z823 Family history of stroke: Secondary | ICD-10-CM

## 2021-10-30 DIAGNOSIS — N50819 Testicular pain, unspecified: Secondary | ICD-10-CM

## 2021-10-30 DIAGNOSIS — I10 Essential (primary) hypertension: Secondary | ICD-10-CM | POA: Diagnosis present

## 2021-10-30 DIAGNOSIS — N492 Inflammatory disorders of scrotum: Principal | ICD-10-CM

## 2021-10-30 DIAGNOSIS — E119 Type 2 diabetes mellitus without complications: Secondary | ICD-10-CM | POA: Diagnosis present

## 2021-10-30 DIAGNOSIS — E118 Type 2 diabetes mellitus with unspecified complications: Secondary | ICD-10-CM

## 2021-10-30 DIAGNOSIS — R1031 Right lower quadrant pain: Secondary | ICD-10-CM

## 2021-10-30 DIAGNOSIS — F17210 Nicotine dependence, cigarettes, uncomplicated: Secondary | ICD-10-CM | POA: Diagnosis present

## 2021-10-30 DIAGNOSIS — L02214 Cutaneous abscess of groin: Secondary | ICD-10-CM | POA: Diagnosis present

## 2021-10-30 DIAGNOSIS — E871 Hypo-osmolality and hyponatremia: Secondary | ICD-10-CM | POA: Diagnosis present

## 2021-10-30 DIAGNOSIS — Z20822 Contact with and (suspected) exposure to covid-19: Secondary | ICD-10-CM | POA: Diagnosis present

## 2021-10-30 DIAGNOSIS — E1169 Type 2 diabetes mellitus with other specified complication: Secondary | ICD-10-CM

## 2021-10-30 DIAGNOSIS — Z7982 Long term (current) use of aspirin: Secondary | ICD-10-CM

## 2021-10-30 DIAGNOSIS — Z800 Family history of malignant neoplasm of digestive organs: Secondary | ICD-10-CM

## 2021-10-30 DIAGNOSIS — Z79899 Other long term (current) drug therapy: Secondary | ICD-10-CM

## 2021-10-30 LAB — CBC WITH DIFFERENTIAL/PLATELET
Abs Immature Granulocytes: 0.04 10*3/uL (ref 0.00–0.07)
Basophils Absolute: 0.1 10*3/uL (ref 0.0–0.1)
Basophils Relative: 1 %
Eosinophils Absolute: 0.2 10*3/uL (ref 0.0–0.5)
Eosinophils Relative: 2 %
HCT: 43.8 % (ref 39.0–52.0)
Hemoglobin: 14.5 g/dL (ref 13.0–17.0)
Immature Granulocytes: 0 %
Lymphocytes Relative: 9 %
Lymphs Abs: 0.9 10*3/uL (ref 0.7–4.0)
MCH: 31.7 pg (ref 26.0–34.0)
MCHC: 33.1 g/dL (ref 30.0–36.0)
MCV: 95.6 fL (ref 80.0–100.0)
Monocytes Absolute: 1 10*3/uL (ref 0.1–1.0)
Monocytes Relative: 10 %
Neutro Abs: 7.9 10*3/uL — ABNORMAL HIGH (ref 1.7–7.7)
Neutrophils Relative %: 78 %
Platelets: 148 10*3/uL — ABNORMAL LOW (ref 150–400)
RBC: 4.58 MIL/uL (ref 4.22–5.81)
RDW: 12.3 % (ref 11.5–15.5)
WBC: 10.1 10*3/uL (ref 4.0–10.5)
nRBC: 0 % (ref 0.0–0.2)

## 2021-10-30 LAB — BASIC METABOLIC PANEL
Anion gap: 8 (ref 5–15)
BUN: 13 mg/dL (ref 8–23)
CO2: 24 mmol/L (ref 22–32)
Calcium: 9.3 mg/dL (ref 8.9–10.3)
Chloride: 104 mmol/L (ref 98–111)
Creatinine, Ser: 0.78 mg/dL (ref 0.61–1.24)
GFR, Estimated: >60 mL/min (ref >60)
Glucose, Bld: 179 mg/dL — ABNORMAL HIGH (ref 70–99)
Potassium: 4.1 mmol/L (ref 3.5–5.1)
Sodium: 136 mmol/L (ref 135–145)

## 2021-10-30 LAB — RESP PANEL BY RT-PCR (FLU A&B, COVID) ARPGX2
Influenza A by PCR: NEGATIVE
Influenza B by PCR: NEGATIVE
SARS Coronavirus 2 by RT PCR: NEGATIVE

## 2021-10-30 LAB — GLUCOSE, CAPILLARY: Glucose-Capillary: 309 mg/dL — ABNORMAL HIGH (ref 70–99)

## 2021-10-30 MED ORDER — ONDANSETRON HCL 4 MG/2ML IJ SOLN
4.0000 mg | Freq: Four times a day (QID) | INTRAMUSCULAR | Status: DC | PRN
Start: 2021-10-30 — End: 2021-11-01

## 2021-10-30 MED ORDER — IOHEXOL 300 MG/ML  SOLN
100.0000 mL | Freq: Once | INTRAMUSCULAR | Status: AC | PRN
  Administered 2021-10-30: 100 mL via INTRAVENOUS

## 2021-10-30 MED ORDER — PRAVASTATIN SODIUM 20 MG PO TABS
40.0000 mg | ORAL_TABLET | Freq: Every day | ORAL | Status: DC
  Administered 2021-10-30 – 2021-10-31 (×2): 40 mg via ORAL
  Filled 2021-10-30 (×2): qty 2

## 2021-10-30 MED ORDER — MORPHINE SULFATE (PF) 2 MG/ML IV SOLN
2.0000 mg | Freq: Once | INTRAVENOUS | Status: AC
  Administered 2021-10-30: 2 mg via INTRAVENOUS
  Filled 2021-10-30: qty 1

## 2021-10-30 MED ORDER — IBUPROFEN 400 MG PO TABS
600.0000 mg | ORAL_TABLET | Freq: Four times a day (QID) | ORAL | Status: DC | PRN
  Administered 2021-10-30 – 2021-11-01 (×4): 600 mg via ORAL
  Filled 2021-10-30 (×3): qty 1
  Filled 2021-10-30: qty 3

## 2021-10-30 MED ORDER — SODIUM CHLORIDE 0.9% FLUSH
3.0000 mL | INTRAVENOUS | Status: DC | PRN
Start: 2021-10-30 — End: 2021-11-01
  Administered 2021-10-30: 3 mL via INTRAVENOUS

## 2021-10-30 MED ORDER — CLINDAMYCIN PHOSPHATE 600 MG/50ML IV SOLN
600.0000 mg | Freq: Three times a day (TID) | INTRAVENOUS | Status: DC
  Administered 2021-10-31 – 2021-11-01 (×4): 600 mg via INTRAVENOUS
  Filled 2021-10-30 (×5): qty 50

## 2021-10-30 MED ORDER — LIDOCAINE HCL (PF) 1 % IJ SOLN
30.0000 mL | Freq: Once | INTRAMUSCULAR | Status: AC
  Administered 2021-10-30: 30 mL via INTRADERMAL
  Filled 2021-10-30: qty 30

## 2021-10-30 MED ORDER — VANCOMYCIN HCL IN DEXTROSE 1-5 GM/200ML-% IV SOLN
1000.0000 mg | Freq: Once | INTRAVENOUS | Status: AC
Start: 2021-10-30 — End: 2021-10-30
  Administered 2021-10-30: 1000 mg via INTRAVENOUS
  Filled 2021-10-30: qty 200

## 2021-10-30 MED ORDER — SODIUM CHLORIDE 0.9% FLUSH
3.0000 mL | Freq: Two times a day (BID) | INTRAVENOUS | Status: DC
  Administered 2021-10-30 – 2021-10-31 (×2): 3 mL via INTRAVENOUS

## 2021-10-30 MED ORDER — HYDROMORPHONE HCL 1 MG/ML IJ SOLN
1.0000 mg | INTRAMUSCULAR | Status: DC | PRN
  Administered 2021-10-30 – 2021-10-31 (×3): 1 mg via INTRAVENOUS
  Filled 2021-10-30 (×3): qty 1

## 2021-10-30 MED ORDER — ONDANSETRON HCL 4 MG PO TABS: 4.0000 mg | ORAL_TABLET | Freq: Four times a day (QID) | ORAL | Status: DC | PRN

## 2021-10-30 MED ORDER — CLINDAMYCIN PHOSPHATE 600 MG/50ML IV SOLN
600.0000 mg | Freq: Once | INTRAVENOUS | Status: AC
  Administered 2021-10-30: 600 mg via INTRAVENOUS
  Filled 2021-10-30: qty 50

## 2021-10-30 MED ORDER — MORPHINE SULFATE (PF) 4 MG/ML IV SOLN
4.0000 mg | Freq: Once | INTRAVENOUS | Status: AC
  Administered 2021-10-30: 4 mg via INTRAVENOUS
  Filled 2021-10-30: qty 1

## 2021-10-30 MED ORDER — SODIUM CHLORIDE 0.9 % IV SOLN
250.0000 mL | INTRAVENOUS | Status: DC | PRN
Start: 2021-10-30 — End: 2021-11-01

## 2021-10-30 MED ORDER — ASPIRIN EC 81 MG PO TBEC
81.0000 mg | DELAYED_RELEASE_TABLET | Freq: Every day | ORAL | Status: DC
  Administered 2021-10-30 – 2021-11-01 (×3): 81 mg via ORAL
  Filled 2021-10-30 (×3): qty 1

## 2021-10-30 MED ORDER — ONDANSETRON HCL 4 MG/2ML IJ SOLN
4.0000 mg | Freq: Once | INTRAMUSCULAR | Status: AC
Start: 2021-10-30 — End: 2021-10-30
  Administered 2021-10-30: 4 mg via INTRAVENOUS
  Filled 2021-10-30: qty 2

## 2021-10-30 MED ORDER — INSULIN ASPART 100 UNIT/ML IJ SOLN
0.0000 [IU] | Freq: Three times a day (TID) | INTRAMUSCULAR | Status: DC
  Administered 2021-10-31 (×2): 2 [IU] via SUBCUTANEOUS
  Administered 2021-11-01: 1 [IU] via SUBCUTANEOUS
  Filled 2021-10-30: qty 0.09

## 2021-10-30 MED ORDER — INSULIN GLARGINE-YFGN 100 UNIT/ML ~~LOC~~ SOLN
32.0000 [IU] | Freq: Every day | SUBCUTANEOUS | Status: DC
  Administered 2021-10-30 – 2021-10-31 (×2): 32 [IU] via SUBCUTANEOUS
  Filled 2021-10-30 (×3): qty 0.32

## 2021-10-30 MED ORDER — SODIUM CHLORIDE 0.9 % IV SOLN
INTRAVENOUS | Status: DC | PRN
  Administered 2021-10-30: 10 mL via INTRAVENOUS

## 2021-10-30 MED ORDER — SENNOSIDES-DOCUSATE SODIUM 8.6-50 MG PO TABS: 1.0000 | ORAL_TABLET | Freq: Every evening | ORAL | Status: DC | PRN

## 2021-10-30 NOTE — H&P (Signed)
?History and Physical  ? ? ?Patient: Carl Barnes CHE:[id number] DOB: 13-Aug-1951 ?DOA: 10/30/2021 ?DOS: the patient was seen and examined on 10/30/2021 ?PCP: Tonia Ghent, MD  ?Patient coming from: Home ? ?Chief Complaint:  ?Chief Complaint  ?Patient presents with  ? Leg Pain  ? ?HPI: Carl Barnes is a 71 y.o. male with medical history significant of DMT2, HTN, HLD who presents with right testicular and scrotal pain with swelling.  He reports through 3 weeks ago he was hit by his granddaughter's dog in the groin when the dog jumped up on him.  He had some pain at the time that resolved.  The next day he noticed a little bit of serous drainage from the right inguinal region which resolved.  A few days later he developed some pain and swelling that is progressively worsened.  Right testicular and scrotal pain has become severe in the last day or 2.  He has not had any fever or chills.  He did see his PCP 2 days ago and had a CT scan performed as an outpatient but was told that it was fine and that he probably had a hernia.  He continued to have severe pain and came to the emergency room today where CT scan showed abscess versus hematoma.  Scrotal ultrasound was also obtained confirming fluid collection.  Urology was consulted and I&D was performed with significant mount of pus obtained.  Cultures were obtained and sent.  Patient started on IV antibiotics.  I&D was packed by neurology who will see patient in the morning for repacking. ? ?Review of Systems: As mentioned in the history of present illness. All other systems reviewed and are negative. ?Past Medical History:  ?Diagnosis Date  ? Cholangitis 12/11  ? [facility] admission - ERCP for stone removal per GI  ? Diabetes mellitus without complication ([place])   ? Elevated glucose 10/16 to 06/09/2004  ? [facility] admission, mild elevated cholesterol  ? History of echocardiogram   ? a. 05/2017 Echo: EF 55-60%, mild LVH, no rwma, mild MR.  ? Hyperlipidemia 12/21/1997  ? Morbid  obesity ([place])   ? Non-obstructive CAD (coronary artery disease)   ? a. 05/2016 Abnl St Test: intermediate risk, large, partially reversible inf/infsept defect consistent w/ ischemia/? scar;  b. 06/2016 Cath: LM nl, LAD 58m RI nl, LCX nl, OM1/2/3 nl, RCA 40p/m, 30d, RPDA/RPAV nl.  ? Poisoning by black widow spider bite 1979  ? RBBB   ? Renal stones   ? Tobacco abuse   ? ?Past Surgical History:  ?Procedure Laterality Date  ? CARDIAC CATHETERIZATION Left 07/06/2016  ? Procedure: Left Heart Cath and Coronary Angiography;  Surgeon: CNelva Bush MD;  Location[facility];  Service: Cardiovascular;  Laterality: Left;  ? Cardiolite  06/08/2004  ? WNL  ? CATARACT EXTRACTION W/ INTRAOCULAR LENS IMPLANT Right 10/13/2017  ? Dr. TSatira Sark ? CATARACT EXTRACTION W/ INTRAOCULAR LENS IMPLANT Left 09/08/2017  ? Dr.Tanner  ? Chest CT w/contrast  06/08/04  ? Negative PE  ? CHOLECYSTECTOMY  01/2011  ? Diseased GB.  [facility]  ? Head MRI  12/99  ? Normal (R/O acoustic neuroma)  ? ?Social History:  reports that he has been smoking cigarettes. He has a 8.75 pack-year smoking history. He has never used smokeless tobacco. He reports current alcohol use of about 2.0 standard drinks per week. He reports that he does not use drugs. ? ?Allergies  ?Allergen Reactions  ? Chantix [Varenicline] Other (See Comments)  ?  intolerant  ? Codeine Other (See Comments)  ?  REACTION: gi upset  ? Metformin And Related Other (See Comments)  ?  Max dose '500mg'$ -'1000mg'$  a day  ? ? ?Family History  ?Problem Relation Age of Onset  ? Cancer Mother   ?     Pancreatic  ? Hyperlipidemia Mother   ? CVA Father   ? Stroke Brother   ? Heart disease Brother   ? Heart disease Paternal Grandfather   ?     MI  ? Diabetes Neg Hx   ? Alcohol abuse Neg Hx   ? Drug abuse Neg Hx   ? Depression Neg Hx   ? Colon cancer Neg Hx   ? Prostate cancer Neg Hx   ? ? ?Prior to Admission medications   ?Medication Sig Start Date End Date Taking? Authorizing Provider  ?ACCU-CHEK AVIVA  PLUS test strip CHECK SUGAR DAILY UP TO 3 TIMES DAILY. INSULIN TREATED DM2 12/12/18   Tonia Ghent, MD  ?aspirin EC 81 MG tablet Take 1 tablet (81 mg total) by mouth daily. 07/12/18   End, Harrell Gave, MD  ?B-D UF III MINI PEN NEEDLES 31G X 5 MM MISC USE DAILY WITH INSULIN PEN 05/07/21   Tonia Ghent, MD  ?betamethasone dipropionate 0.05 % cream Apply 1 application topically as needed. 09/06/19   [provider]  ?calcipotriene (DOVONOX) 0.005 % cream Apply 1 application topically 2 (two) times daily as needed. 09/06/19   [provider]  ?cetirizine (ZYRTEC) 10 MG tablet Take 10 mg by mouth daily as needed (for seasonal allergies with lawn mowing).     [provider]  ?gabapentin (NEURONTIN) 600 MG tablet Take 1 tablet by mouth in the morning and at bedtime.    [provider]  ?ibuprofen (ADVIL,MOTRIN) 200 MG tablet Take 600 mg by mouth every 8 (eight) hours as needed (for pain.).    [provider]  ?insulin glargine (LANTUS SOLOSTAR) 100 UNIT/ML Solostar Pen INJECT 32 UNITS UNDER THE SKIN ONCE DAILY 11/24/20   Tonia Ghent, MD  ?metFORMIN (GLUCOPHAGE) 500 MG tablet TAKE 1 TABLET BY MOUTH AT BEDTIME 10/19/21   Tonia Ghent, MD  ?pravastatin (PRAVACHOL) 40 MG tablet TAKE 1 TABLET BY MOUTH EVERY NIGHT AT BEDTIME 12/15/20   Tonia Ghent, MD  ? ? ?Physical Exam: ?Vitals:  ? 10/30/21 1500 10/30/21 1617 10/30/21 1655 10/30/21 1818  ?BP: 129/71 (!) 113/58  (!) 144/78  ?Pulse: 65 64  72  ?Resp: '16 14  18  '$ ?Temp:   98.3 ?F (36.8 ?C)   ?TempSrc:   Oral   ?SpO2: 96% 98%  97%  ?Weight:      ?Height:      ? ?General: WDWN, Alert and oriented x3.  ?Neck: Soft, normal range of motion, supple, Trachea midline ?Respiratory: clear to auscultation bilaterally, no wheezing, no crackles. Normal respiratory effort. No accessory muscle use.  ?Cardiovascular: Regular rate and rhythm, no murmurs / rubs / gallops. No extremity edema.  ?Abdomen: Soft, no tenderness, nondistended, no  rebound or guarding.  No masses palpated. Bowel sounds normoactive ?GU:  Normal male. Right lateral scrotum with dressing over I&D incision  ?Musculoskeletal: FROM. no cyanosis. Normal muscle tone.  ?Skin: Warm, dry, intact no rashes, erythema and induration of lateral right scrotum ?Neurologic: CN 2-12 grossly intact.  Normal speech.Strength 5/5 in all extremities.   ?Psychiatric: Normal judgment and insight. Normal mood.  ? ?Data Reviewed: ?Lab Work:   WBC 10,145 hematocrit 43.8 platelets  140,000 sodium 136 potassium 4.1 chloride 104 bicarb 24 creatinine 0.78 BUN 13 glucose 179 calcium 9.3 ? ?CT pelvis with contrast shows 5.5 cm rim-enhancing fluid collection in the right lateral scrotal region consistent with scrotal wall abscess with associated cellulitis inflammatory changes surrounding skin mild right inguinal adenopathy present ? ?Ultrasound of pelvis shows a 6.5 x 4.9 x 4.1 avascular masslike area in the right lateral scrotal wall reflecting abscess or hematoma no testicular torsion noted ? ?Assessment and Plan: ?* Scrotal abscess ?Carl Barnes is placed on Med Surg floor.  ?Started on IV clindamycin for antibiotic coverage ?Urology has evaluated the patient and performed I&D of abscess. Cultures sent to lab and will be monitored. ?Urology will see in am and repack the site.  ?Pain control provided ? ?Diabetes mellitus type 2 in nonobese [facility]) ?Continue lantus. Metformin held for 48 hours after having IV contrast for CT scan ?Monitor blood glucose. SSI as needed. ?Check HgbA1c  ? ?Essential hypertension ?Monitor BP.  ?Start ARB therapy low dose in light of HTN in diabetic for renal protection and BP control ? ?Advance Care Planning:  Code Status:  Full Code.  Padua score low, early ambulation and TED hose for DVT prophylaxis ? ?Consults: Urology, Dr. Abner Greenspan ? ?Family Communication: Diagnosis and plan discussed with patient who verbalized understanding and agrees with plan.  Further recommendations to follow as  clinical indicated ? ?Author: ?Eben Burow, MD ?10/30/2021 7:45 PM ? ?For on call review [email or web address].  ?

## 2021-10-30 NOTE — ED Notes (Signed)
ED Provider at bedside. 

## 2021-10-30 NOTE — ED Provider Notes (Signed)
Patient transferred from [facility] Ed ED. He is stable. Dr. Abner Greenspan made aware that patient is in ED. Bedside I&D orders placed. ?  ?Urology has drained and is recommending admission for wound care and antibiotics. Given medical history (I.e. diabetes), will admit to hospitalist. Dr. Tonie Griffith to admit.  ? ?  ?Sherwood Gambler, MD ?10/30/21 2212 ? ?

## 2021-10-30 NOTE — ED Provider Notes (Cosign Needed)
[facility] DEPT Provider Note   CSN: [id number] Arrival date & time: 10/30/21  1214     History  Chief Complaint  Patient presents with   Leg Pain    Carl Barnes is a 71 y.o. male who presents emergency department with chief complaint of right testicle pain.  He has a past medical history of insulin-dependent diabetes mellitus.  Patient reports 3 weeks ago he was hit in the groin by his granddaughter's dog who jumped on him.  He noticed some pain at the time.  The next day he saw a bit of serous drainage from his right inguinal region.  This resolved.  Earlier this week he got hit in the exact same spot and since that time has had progressively worsening swelling in his testicles, pain which is severe.  He denies fevers or chills.  He saw his PCP 2 days ago and had a CT scan which was performed in the outpatient setting.  He was told that there was no evidence of infection and that he likely had a hernia.  He is concerned he is has infection in the area and came in for further evaluation.  I reviewed the patient's CT scan which incompletely visualized the entire groin.  There was obvious findings of inflammatory versus infectious process.   Leg Pain     Home Medications Prior to Admission medications   Medication Sig Start Date End Date Taking? Authorizing Provider  ACCU-CHEK AVIVA PLUS test strip CHECK SUGAR DAILY UP TO 3 TIMES DAILY. INSULIN TREATED DM2 12/12/18   Tonia Ghent, MD  aspirin EC 81 MG tablet Take 1 tablet (81 mg total) by mouth daily. 07/12/18   End, Harrell Gave, MD  B-D UF III MINI PEN NEEDLES 31G X 5 MM MISC USE DAILY WITH INSULIN PEN 05/07/21   Tonia Ghent, MD  betamethasone dipropionate 0.05 % cream Apply 1 application topically as needed. 09/06/19   [provider]  calcipotriene (DOVONOX) 0.005 % cream Apply 1 application topically 2 (two) times daily as needed. 09/06/19   [provider]  cetirizine (ZYRTEC) 10 MG  tablet Take 10 mg by mouth daily as needed (for seasonal allergies with lawn mowing).     [provider]  gabapentin (NEURONTIN) 600 MG tablet Take 1 tablet by mouth in the morning and at bedtime.    [provider]  ibuprofen (ADVIL,MOTRIN) 200 MG tablet Take 600 mg by mouth every 8 (eight) hours as needed (for pain.).    [provider]  insulin glargine (LANTUS SOLOSTAR) 100 UNIT/ML Solostar Pen INJECT 32 UNITS UNDER THE SKIN ONCE DAILY 11/24/20   Tonia Ghent, MD  metFORMIN (GLUCOPHAGE) 500 MG tablet TAKE 1 TABLET BY MOUTH AT BEDTIME 10/19/21   Tonia Ghent, MD  pravastatin (PRAVACHOL) 40 MG tablet TAKE 1 TABLET BY MOUTH EVERY NIGHT AT BEDTIME 12/15/20   Tonia Ghent, MD      Allergies    Chantix [varenicline], Codeine, and Metformin and related    Review of Systems   Review of Systems  Physical Exam Updated Vital Signs BP 136/77 (BP Location: Right Arm)    Pulse 70    Temp 99.1 F (37.3 C) (Oral)    Resp 18    Ht '5\' 11"'$  (1.803 m)    Wt 93.9 kg    SpO2 96%    BMI 28.87 kg/m  Physical Exam Vitals and nursing note reviewed.  Constitutional:      General:  He is not in acute distress.    Appearance: He is well-developed. He is not diaphoretic.  HENT:     Head: Normocephalic and atraumatic.  Eyes:     General: No scleral icterus.    Conjunctiva/sclera: Conjunctivae normal.  Cardiovascular:     Rate and Rhythm: Normal rate and regular rhythm.     Heart sounds: Normal heart sounds.  Pulmonary:     Effort: Pulmonary effort is normal. No respiratory distress.     Breath sounds: Normal breath sounds.  Abdominal:     Palpations: Abdomen is soft.     Tenderness: There is no abdominal tenderness.  Genitourinary:    Comments: Bilateral scrotal swelling right greater than left without testicular swelling or tenderness.  There is an area of fluctuance in the inferior inguinal region next to the scrotum with surrounding induration extending all the way to  the top of the pubis with lymphangitis and exquisite tenderness noted. Musculoskeletal:     Cervical back: Normal range of motion and neck supple.  Skin:    General: Skin is warm and dry.  Neurological:     Mental Status: He is alert.  Psychiatric:        Behavior: Behavior normal.    ED Results / Procedures / Treatments   Labs (all labs ordered are listed, but only abnormal results are displayed) Labs Reviewed  BASIC METABOLIC PANEL  CBC WITH DIFFERENTIAL/PLATELET    EKG None  Radiology CT Abdomen Pelvis Wo Contrast  Result Date: 10/28/2021 CLINICAL DATA:  Right groin swelling for 2 weeks. Concern for incarcerated hernia. EXAM: CT ABDOMEN AND PELVIS WITHOUT CONTRAST TECHNIQUE: Multidetector CT imaging of the abdomen and pelvis was performed following the standard protocol without IV contrast. RADIATION DOSE REDUCTION: This exam was performed according to the departmental dose-optimization program which includes automated exposure control, adjustment of the mA and/or kV according to patient size and/or use of iterative reconstruction technique. COMPARISON:  CT 08/12/2010. FINDINGS: Lower chest: Clear lung bases. No significant pleural or pericardial effusion. Coronary and aortic atherosclerosis noted. Hepatobiliary: The liver demonstrates diffusely decreased density consistent with steatosis. There is chronic parenchymal atrophy within the left hepatic lobe which appears unchanged. A previously demonstrated hemangioma posteriorly in the right hepatic lobe is not well seen on this noncontrast study. Previously noted pneumobilia is no longer demonstrated. Mild extrahepatic biliary dilatation is unchanged status post cholecystectomy. Pancreas: Unremarkable. No pancreatic ductal dilatation or surrounding inflammatory changes. Spleen: Normal in size without focal abnormality. Adrenals/Urinary Tract: There are stable small low-density adrenal adenomas bilaterally. Both kidneys appear unremarkable  as imaged in the noncontrast state. There is no evidence of urinary tract calculus, hydronephrosis or perinephric soft tissue stranding. The bladder appears unremarkable. Stomach/Bowel: No enteric contrast was administered. The stomach appears unremarkable for its degree of distension. No evidence of bowel wall thickening, distention or surrounding inflammatory change. There is a stable small duodenal diverticulum. The appendix appears normal. There is scattered descending and sigmoid colon diverticulosis. Vascular/Lymphatic: There are no enlarged abdominopelvic lymph nodes. There are small asymmetric right inguinal lymph nodes which measure up to 1.5 cm on image 88/2. Aortic and branch vessel atherosclerosis without acute vascular findings on noncontrast imaging. Reproductive: The prostate gland and seminal vesicles appear unremarkable. Other: A small amount of fat is present in both inguinal canals, unchanged. No discrete hernia identified. However, there is asymmetric soft tissue stranding in the subcutaneous fat of the medial right groin and upper scrotum, incompletely visualized. No foreign body, soft tissue  emphysema or fluid collection is seen in this area. There is no herniated bowel. Musculoskeletal: No acute or significant osseous findings. Mild spondylosis. IMPRESSION: 1. Soft tissue stranding in the subcutaneous fat of the right groin and upper scrotum, incompletely visualized. No discrete hernia is demonstrated. These findings may be posttraumatic or inflammatory (cellulitis). Correlate clinically and consider scrotal ultrasound for further evaluation. 2. Mildly prominent asymmetric right inguinal lymph nodes, likely reactive. 3. Stable incidental findings including bilateral adrenal adenomas, mild biliary dilatation post cholecystectomy, distal colonic diverticulosis and Aortic Atherosclerosis (ICD10-I70.0). Electronically Signed   By: Richardean Sale M.D.   On: 10/28/2021 14:03     Procedures Ultrasound ED Soft Tissue  Date/Time: 10/30/2021 1:34 PM Performed by: Margarita Mail, PA-C Authorized by: Margarita Mail, PA-C   Procedure details:    Indications: localization of abscess and evaluate for cellulitis     Transverse view:  Visualized   Longitudinal view:  Visualized   Images: not archived   Location:    Location: groin     Side:  Right Findings:     abscess present    cellulitis present    Medications Ordered in ED Medications  morphine (PF) 4 MG/ML injection 4 mg (has no administration in time range)  ondansetron (ZOFRAN) injection 4 mg (has no administration in time range)  vancomycin (VANCOCIN) IVPB 1000 mg/200 mL premix (has no administration in time range)  0.9 %  sodium chloride infusion (has no administration in time range)    ED Course/ Medical Decision Making/ A&P                           Medical Decision Making 71 year old male with IDDM who presents with right scrotal and inguinal pain.  Patient has a very obvious and very extensive abscess in this region.  I ordered, visualized and reviewed imaging as discussed in ED course including ultrasound and CT scan.  Patient has a large collection of fluid that I do not feel comfortable incising at bedside.  I Discussed the case with Dr. Abner Greenspan who is excepted the patient at [facility] long emergency department where he will see him in transfer.  After multiple rounds of pain medication patient is feeling significantly improved.  He has received IV vancomycin.  Amount and/or Complexity of Data Reviewed Independent Historian: spouse Labs: ordered. Decision-making details documented in ED Course. Radiology: ordered and independent interpretation performed. Decision-making details documented in ED Course. Discussion of management or test interpretation with external provider(s): Dr. Abner Greenspan who asks for ED-ED transfer- accepted by Dr. Regenia Skeeter.  Risk Prescription drug management. Decision regarding  hospitalization.            Final Clinical Impression(s) / ED Diagnoses Final diagnoses:  None    Rx / DC Orders ED Discharge Orders     None         Margarita Mail, PA-C 10/30/21 1953

## 2021-10-30 NOTE — Assessment & Plan Note (Addendum)
Present with scrotal pain, swelling, CT scan consistent with abscess: 5.5 cm rim enhancing fluid collection.  ?Urology evaluated the patient and performed I&D of abscess.  ?Culture growing: Gram positive cocci.  ?Treated  with IV clindamycin while in the hospital.  ?Pain better controlled. He will be discharge on Vicodin. Wife will learn to do wound care. He will be discharge on Bactrim.  ?Culture results will need to be follow.  ? ? ?

## 2021-10-30 NOTE — ED Triage Notes (Signed)
Pt stated three weeks ago dog jumped up and hit pt in the right testicle/inner groin. Saw PCP on Wednesday and was sent to imaging center and was told that it is a fat deposit. Pt states there has been clear drainage and excessive pain. Called PCP today and was advised to come to the ED. ?

## 2021-10-30 NOTE — Assessment & Plan Note (Addendum)
Continue Semglee.  ?HgbA1c pending.  ?Resume  Metformin at discharge ? ?

## 2021-10-30 NOTE — Consult Note (Signed)
Urology Consult   Physician requesting consult: Sherwood Gambler, MD  Reason for consult: Scrotal abscess  History of Present Illness:  Carl Barnes is a 71 year old male who presents with a several day history of right-sided groin pain.  He states that several weeks ago, he was hit by a dog in his right groin and the day following he noted some drainage from the right inguinal crease.  This area enlarged over the following days he developed pain and progressive swelling.  He denies fevers or chills.  CT abdomen pelvis 10/30/2021 demonstrated concern for abscess.  The area started spontaneously draining earlier today.  He denies a history of prior abscesses.  He does have a history of diabetes, hypertension, hyperlipidemia.   Past Medical History:  Diagnosis Date   Cholangitis 12/11   [facility] admission - ERCP for stone removal per GI   Diabetes mellitus without complication ([place])    Elevated glucose 10/16 to 06/09/2004   [facility] admission, mild elevated cholesterol   History of echocardiogram    a. 05/2017 Echo: EF 55-60%, mild LVH, no rwma, mild MR.   Hyperlipidemia 12/21/1997   Morbid obesity ([place])    Non-obstructive CAD (coronary artery disease)    a. 05/2016 Abnl St Test: intermediate risk, large, partially reversible inf/infsept defect consistent w/ ischemia/? scar;  b. 06/2016 Cath: LM nl, LAD 50m RI nl, LCX nl, OM1/2/3 nl, RCA 40p/m, 30d, RPDA/RPAV nl.   Poisoning by black widow spider bite 1979   RBBB    Renal stones    Tobacco abuse     Past Surgical History:  Procedure Laterality Date   CARDIAC CATHETERIZATION Left 07/06/2016   Procedure: Left Heart Cath and Coronary Angiography;  Surgeon: CNelva Bush MD;  [facility];  Service: Cardiovascular;  Laterality: Left;   Cardiolite  06/08/2004   WNL   CATARACT EXTRACTION W/ INTRAOCULAR LENS IMPLANT Right 10/13/2017   Dr. TSatira Sark  CATARACT EXTRACTION W/ INTRAOCULAR LENS IMPLANT Left 09/08/2017   Dr.Tanner    Chest CT w/contrast  06/08/04   Negative PE   CHOLECYSTECTOMY  01/2011   Diseased GB.  [facility]  12/99   Normal (R/O acoustic neuroma)    Current Hospital Medications:  Home Meds:  No current facility-administered medications on file prior to encounter.   Current Outpatient Medications on File Prior to Encounter  Medication Sig Dispense Refill   ACCU-CHEK AVIVA PLUS test strip CHECK SUGAR DAILY UP TO 3 TIMES DAILY. INSULIN TREATED DM2 100 each 12   aspirin EC 81 MG tablet Take 1 tablet (81 mg total) by mouth daily. 90 tablet 3   B-D UF III MINI PEN NEEDLES 31G X 5 MM MISC USE DAILY WITH INSULIN PEN 100 each 3   betamethasone dipropionate 0.05 % cream Apply 1 application topically as needed.     calcipotriene (DOVONOX) 0.005 % cream Apply 1 application topically 2 (two) times daily as needed.     cetirizine (ZYRTEC) 10 MG tablet Take 10 mg by mouth daily as needed (for seasonal allergies with lawn mowing).      gabapentin (NEURONTIN) 600 MG tablet Take 1 tablet by mouth in the morning and at bedtime.     ibuprofen (ADVIL,MOTRIN) 200 MG tablet Take 600 mg by mouth every 8 (eight) hours as needed (for pain.).     insulin glargine (LANTUS SOLOSTAR) 100 UNIT/ML Solostar Pen INJECT 32 UNITS UNDER THE SKIN ONCE DAILY 15 mL 3   metFORMIN (GLUCOPHAGE) 500 MG  tablet TAKE 1 TABLET BY MOUTH AT BEDTIME 90 tablet 0   pravastatin (PRAVACHOL) 40 MG tablet TAKE 1 TABLET BY MOUTH EVERY NIGHT AT BEDTIME 90 tablet 3     Scheduled Meds: Continuous Infusions:  sodium chloride Stopped (10/30/21 1459)   clindamycin (CLEOCIN) IV 600 mg (10/30/21 1935)   PRN Meds:.sodium chloride  Allergies:  Allergies  Allergen Reactions   Chantix [Varenicline] Other (See Comments)    intolerant   Codeine Other (See Comments)    REACTION: gi upset   Metformin And Related Other (See Comments)    Max dose '500mg'$ -'1000mg'$  a day    Family History  Problem Relation Age of Onset   Cancer Mother         Pancreatic   Hyperlipidemia Mother    CVA Father    Stroke Brother    Heart disease Brother    Heart disease Paternal Grandfather        MI   Diabetes Neg Hx    Alcohol abuse Neg Hx    Drug abuse Neg Hx    Depression Neg Hx    Colon cancer Neg Hx    Prostate cancer Neg Hx     Social History:  reports that he has been smoking cigarettes. He has a 8.75 pack-year smoking history. He has never used smokeless tobacco. He reports current alcohol use of about 2.0 standard drinks per week. He reports that he does not use drugs.  ROS: A complete review of systems was performed.  All systems are negative except for pertinent findings as noted.  Physical Exam:  Vital signs in last 24 hours: Temp:  [98.3 F (36.8 C)-99.1 F (37.3 C)] 98.3 F (36.8 C) (03/10 1655) Pulse Rate:  [64-72] 72 (03/10 1818) Resp:  [14-18] 18 (03/10 1818) BP: (113-144)/(58-91) 144/78 (03/10 1818) SpO2:  [95 %-98 %] 97 % (03/10 1818) Weight:  [93.9 kg] 93.9 kg (03/10 1220) Constitutional:  Alert and oriented, No acute distress Cardiovascular: Regular rate and rhythm Respiratory: Normal respiratory effort, Lungs clear bilaterally GU: Firm and spontaneously draining right sided scrotal abscess tracking superiorly to his right inguinal region Neurologic: Grossly intact, no focal deficits Psychiatric: Normal mood and affect  Laboratory Data:  Recent Labs    10/30/21 1333  WBC 10.1  HGB 14.5  HCT 43.8  PLT 148*    Recent Labs    10/30/21 1333  NA 136  K 4.1  CL 104  GLUCOSE 179*  BUN 13  CALCIUM 9.3  CREATININE 0.78     Results for orders placed or performed during the hospital encounter of 10/30/21 (from the past 24 hour(s))  Basic metabolic panel     Status: Abnormal   Collection Time: 10/30/21  1:33 PM  Result Value Ref Range   Sodium 136 135 - 145 mmol/L   Potassium 4.1 3.5 - 5.1 mmol/L   Chloride 104 98 - 111 mmol/L   CO2 24 22 - 32 mmol/L   Glucose, Bld 179 (H) 70 - 99 mg/dL   BUN 13  8 - 23 mg/dL   Creatinine, Ser 0.78 0.61 - 1.24 mg/dL   Calcium 9.3 8.9 - 10.3 mg/dL   GFR, Estimated >60 >60 mL/min   Anion gap 8 5 - 15  CBC with Differential     Status: Abnormal   Collection Time: 10/30/21  1:33 PM  Result Value Ref Range   WBC 10.1 4.0 - 10.5 K/uL   RBC 4.58 4.22 - 5.81 MIL/uL   Hemoglobin 14.5  13.0 - 17.0 g/dL   HCT 43.8 39.0 - 52.0 %   MCV 95.6 80.0 - 100.0 fL   MCH 31.7 26.0 - 34.0 pg   MCHC 33.1 30.0 - 36.0 g/dL   RDW 12.3 11.5 - 15.5 %   Platelets 148 (L) 150 - 400 K/uL   nRBC 0.0 0.0 - 0.2 %   Neutrophils Relative % 78 %   Neutro Abs 7.9 (H) 1.7 - 7.7 K/uL   Lymphocytes Relative 9 %   Lymphs Abs 0.9 0.7 - 4.0 K/uL   Monocytes Relative 10 %   Monocytes Absolute 1.0 0.1 - 1.0 K/uL   Eosinophils Relative 2 %   Eosinophils Absolute 0.2 0.0 - 0.5 K/uL   Basophils Relative 1 %   Basophils Absolute 0.1 0.0 - 0.1 K/uL   Immature Granulocytes 0 %   Abs Immature Granulocytes 0.04 0.00 - 0.07 K/uL   No results found for this or any previous visit (from the past 240 hour(s)).  Renal Function: Recent Labs    10/30/21 1333  CREATININE 0.78   Estimated Creatinine Clearance: 99.1 mL/min (by C-G formula based on SCr of 0.78 mg/dL).  Radiologic Imaging: CT PELVIS W CONTRAST  Result Date: 10/30/2021 CLINICAL DATA:  Pelvic abscess EXAM: CT PELVIS WITH CONTRAST TECHNIQUE: Multidetector CT imaging of the pelvis was performed using the standard protocol following the bolus administration of intravenous contrast. RADIATION DOSE REDUCTION: This exam was performed according to the departmental dose-optimization program which includes automated exposure control, adjustment of the mA and/or kV according to patient size and/or use of iterative reconstruction technique. CONTRAST:  122m OMNIPAQUE IOHEXOL 300 MG/ML  SOLN COMPARISON:  Ultrasound 10/30/2021, CT 10/28/2021 FINDINGS: Urinary Tract:  Urinary bladder grossly unremarkable Bowel: Sigmoid colon diverticular disease  without acute wall thickening. Negative appendix Vascular/Lymphatic: Mild atherosclerosis. No aneurysm. Mildly enlarged right inguinal lymph nodes up to 15 mm likely reactive Reproductive: Small right hydrocele. Diffuse fluid and edema within the scrotal wall. Irregular rim enhancing fluid collection measuring 5.5 by 2.7 by 5.3 cm within the right lateral scrotal wall consistent with abscess. Associated soft tissue stranding and edema at the right spermatic cord and pubic soft tissues. Other:  Negative for pelvic effusion Musculoskeletal: No acute osseous abnormality IMPRESSION: 1. 5.5 cm rim enhancing fluid collection within the right lateral scrotal region, suspicious for scrotal wall abscess. Associated cellulitis/soft tissue inflammatory changes with scrotal wall edema. 2. Mild right inguinal adenopathy is likely reactive 3. Sigmoid colon diverticular disease without acute inflammatory process Electronically Signed   By: KDonavan FoilM.D.   On: 10/30/2021 15:43   [place]IS LIMITED (TRANSABDOMINAL ONLY)  Result Date: 10/30/2021 CLINICAL DATA:  Right testicular and groin pain EXAM: SCROTAL ULTRASOUND DOPPLER ULTRASOUND OF THE TESTICLES TECHNIQUE: Complete ultrasound examination of the testicles, epididymis, and other scrotal structures was performed. Color and spectral Doppler ultrasound were also utilized to evaluate blood flow to the testicles. COMPARISON:  CT abdomen/pelvis 10/28/2021 FINDINGS: Right testicle Measurements: 4.7 x 2.3 x 3.7 cm. No mass or microlithiasis visualized. Left testicle Measurements: 5.1 x 2.4 x 3.6 cm. No mass or microlithiasis visualized. Right epididymis:  Normal in size and appearance. Left epididymis:  Normal in size and appearance. Hydrocele:  Small right hydrocele. Varicocele:  None visualized. Pulsed Doppler interrogation of both testes demonstrates normal low resistance arterial and venous waveforms bilaterally. Other: 6.5 x 5 x 4.9 x 4.1 cm heterogeneously hypoechoic  avascular masslike area along the right lateral scrotum which may reflect a large abscess  or hematoma. IMPRESSION: 1. 6.5 x 4.9 x 4.1 cm heterogeneously hypoechoic avascular masslike area along the right lateral scrotum which may reflect a large abscess or hematoma. 2. No testicular torsion. Electronically Signed   By: Kathreen Devoid M.D.   On: 10/30/2021 14:48   US SCROTUM W/DOPPLER  Result Date: 10/30/2021 CLINICAL DATA:  Right testicular and groin pain EXAM: SCROTAL ULTRASOUND DOPPLER ULTRASOUND OF THE TESTICLES TECHNIQUE: Complete ultrasound examination of the testicles, epididymis, and other scrotal structures was performed. Color and spectral Doppler ultrasound were also utilized to evaluate blood flow to the testicles. COMPARISON:  CT abdomen/pelvis 10/28/2021 FINDINGS: Right testicle Measurements: 4.7 x 2.3 x 3.7 cm. No mass or microlithiasis visualized. Left testicle Measurements: 5.1 x 2.4 x 3.6 cm. No mass or microlithiasis visualized. Right epididymis:  Normal in size and appearance. Left epididymis:  Normal in size and appearance. Hydrocele:  Small right hydrocele. Varicocele:  None visualized. Pulsed Doppler interrogation of both testes demonstrates normal low resistance arterial and venous waveforms bilaterally. Other: 6.5 x 5 x 4.9 x 4.1 cm heterogeneously hypoechoic avascular masslike area along the right lateral scrotum which may reflect a large abscess or hematoma. IMPRESSION: 1. 6.5 x 4.9 x 4.1 cm heterogeneously hypoechoic avascular masslike area along the right lateral scrotum which may reflect a large abscess or hematoma. 2. No testicular torsion. Electronically Signed   By: Kathreen Devoid M.D.   On: 10/30/2021 14:48    I independently reviewed the above imaging studies.  Procedure note: I obtained verbal consent to perform bedside incision and drainage of his right testicle abscess.  Under sterile conditions, I prepped and draped his scrotum.  I gave him lidocaine for local anesthesia.   I then made approximately 3 cm incision in his right scrotum with return of purulent material.  These were then sent for culture swab.  I inserted my index finger and this tracked superiorly towards his right inguinal region approximately 8 cm.  I broke up all loculations.  There is no undrained further abscesses.  I then irrigated the wound copiously with normal saline.  I then tightly packed quarter-inch gauze into the wound.  He tolerated the procedure well.  Impression/Recommendation 1.  Scrotal abscess: I performed bedside incision and drainage of right-sided scrotal abscess.  This abscess tracked superiorly up towards his inguinal region.  This extended approximately 8 cm. -Send scrotal abscess fluid for culture. -Continue antibiotics -We will perform first packing tomorrow. -He and his wife were not comfortable performing abscess packing at home.  This will require home health management.  Matt R. Kaimana Neuzil MD 10/30/2021, 7:53 PM  [facility]  Pager: [phone]

## 2021-10-30 NOTE — Assessment & Plan Note (Addendum)
Not on medications. Monitor.  ? ?

## 2021-10-31 DIAGNOSIS — N492 Inflammatory disorders of scrotum: Secondary | ICD-10-CM

## 2021-10-31 DIAGNOSIS — R4922 Hyponasality: Secondary | ICD-10-CM

## 2021-10-31 LAB — BASIC METABOLIC PANEL
Anion gap: 7 (ref 5–15)
BUN: 14 mg/dL (ref 8–23)
CO2: 27 mmol/L (ref 22–32)
Calcium: 8.7 mg/dL — ABNORMAL LOW (ref 8.9–10.3)
Chloride: 100 mmol/L (ref 98–111)
Creatinine, Ser: 0.83 mg/dL (ref 0.61–1.24)
GFR, Estimated: >60 mL/min (ref >60)
Glucose, Bld: 166 mg/dL — ABNORMAL HIGH (ref 70–99)
Potassium: 3.8 mmol/L (ref 3.5–5.1)
Sodium: 134 mmol/L — ABNORMAL LOW (ref 135–145)

## 2021-10-31 LAB — GLUCOSE, CAPILLARY
Glucose-Capillary: 158 mg/dL — ABNORMAL HIGH (ref 70–99)
Glucose-Capillary: 190 mg/dL — ABNORMAL HIGH (ref 70–99)
Glucose-Capillary: 178 mg/dL — ABNORMAL HIGH (ref 70–99)
Glucose-Capillary: 203 mg/dL — ABNORMAL HIGH (ref 70–99)

## 2021-10-31 LAB — CBC
HCT: 40.7 % (ref 39.0–52.0)
Hemoglobin: 13.5 g/dL (ref 13.0–17.0)
MCH: 32.5 pg (ref 26.0–34.0)
MCHC: 33.2 g/dL (ref 30.0–36.0)
MCV: 98.1 fL (ref 80.0–100.0)
Platelets: 158 10*3/uL (ref 150–400)
RBC: 4.15 MIL/uL — ABNORMAL LOW (ref 4.22–5.81)
RDW: 12.4 % (ref 11.5–15.5)
WBC: 9.7 10*3/uL (ref 4.0–10.5)
nRBC: 0 % (ref 0.0–0.2)

## 2021-10-31 MED ORDER — DIPHENHYDRAMINE HCL 25 MG PO CAPS: 25.0000 mg | ORAL_CAPSULE | Freq: Once | ORAL | Status: DC

## 2021-10-31 MED ORDER — NICOTINE 14 MG/24HR TD PT24
14.0000 mg | MEDICATED_PATCH | Freq: Every day | TRANSDERMAL | Status: DC
  Administered 2021-10-31 – 2021-11-01 (×2): 14 mg via TRANSDERMAL
  Filled 2021-10-31 (×2): qty 1

## 2021-10-31 MED ORDER — TRAMADOL HCL 50 MG PO TABS
50.0000 mg | ORAL_TABLET | Freq: Four times a day (QID) | ORAL | Status: DC | PRN
  Administered 2021-10-31 – 2021-11-01 (×3): 50 mg via ORAL
  Filled 2021-10-31 (×3): qty 1

## 2021-10-31 MED ORDER — DIPHENHYDRAMINE HCL 25 MG PO CAPS: 25.0000 mg | ORAL_CAPSULE | Freq: Four times a day (QID) | ORAL | Status: DC | PRN

## 2021-10-31 NOTE — Assessment & Plan Note (Signed)
Very mild. Continue with IV fluids.  ?

## 2021-10-31 NOTE — Plan of Care (Signed)
?  Problem: Clinical Measurements: ?Goal: Diagnostic test results will improve ?Outcome: Progressing ?  ?Problem: Nutrition: ?Goal: Adequate nutrition will be maintained ?Outcome: Progressing ?  ?Problem: Elimination: ?Goal: Will not experience complications related to bowel motility ?Outcome: Progressing ?Goal: Will not experience complications related to urinary retention ?Outcome: Progressing ?  ?Problem: Pain Managment: ?Goal: General experience of comfort will improve ?Outcome: Progressing ?  ?

## 2021-10-31 NOTE — Progress Notes (Addendum)
?  Progress Note ? ? ?Patient: Carl Barnes BEM:[id number] DOB: [birth year] DOA: 10/30/2021     0 ?DOS: the patient was seen and examined on 10/31/2021 ?  ?Brief hospital course: ?71 year old past medical history significant for diabetes type 2, hypertension, hyperlipidemia presents with right testicular and scrotal pain with swelling.  He report 3 weeks ago he was hit by his granddaughter's dog in the groin when the dog jumped up on him.  The next day he noticed a little bit of serous drainage from the right inguinal region which resolved.  Few days later he developed some pain and swelling that is progressively worsened.  The right testicular and scrotal pain has become severe in the last 2 days. ? ?Evaluation in the ED CT scan show abscess versus hematoma.  Scrotal ultrasound confirmed fluid collection.  Urology was consulted and I&D was performed with significant amount of post drain. ? ?Patient admitted for IV antibiotics and further care of scrotal abscess. ? ?Assessment and Plan: ?* Scrotal abscess ?Present with scrotal pain, swelling, CT scan consistent with abscess: 5.5 cm rim enhancing fluid collection.  ?Urology evaluated the patient and performed I&D of abscess.  ?Culture growing: Gram positive cocci.  ?Continue with IV clindamycin.  ?Still requiring IV dilaudid for pain controlled. Family needs to learn to do wound packing.  ?Plan to remain in patient for IV antibiotics, pain controlled and family education of wound care.  ? ? ? ?Diabetes mellitus type 2 in nonobese [facility]) ?Continue Semglee.  ?HgbA1c pending.  ?Hold metformin.  ? ? ?Essential hypertension ?Not on medications. Monitor.  ? ? ?Hyponasality ?Very mild. Continue with IV fluids.  ? ? ? ? ?  ? ?Subjective:  ?He report scrotal pain. Develops itching after he received dilaudid.  ?Wife at bedside, is willing to learn to do dressing changes, does not feel ready to do it for tomorrow.  ? ?Physical Exam: ?Vitals:  ? 10/30/21 2342 10/31/21 0140 10/31/21  0502 10/31/21 1300  ?BP: 122/80 112/61 108/67 129/68  ?Pulse: 63 (!) 58 (!) 46 (!) 56  ?Resp: '18 18 17 16  '$ ?Temp: 98.4 ?F (36.9 ?C) 97.9 ?F (36.6 ?C) 97.7 ?F (36.5 ?C) 97.9 ?F (36.6 ?C)  ?TempSrc:  Oral Oral Oral  ?SpO2: 96% 96% 97% 96%  ?Weight:      ?Height:      ? ?General; NAD ?Lung; CTA ?Abdomen; soft, nt ?Scrotum; with packing in place.  ? ?Data Reviewed: ? ?Cbc , bmet and CT scan reviewed.  ? ?Family Communication: Care discussed with patient and wife who was at bedside.  ? ?Disposition: ?Status is: Observation ?The patient will require care spanning > 2 midnights and should be moved to inpatient because: required pain controlled, IV antibiotics, family need to learn to do packing changes.  ? ? Planned Discharge Destination: Home ? ? ? ?Time spent: 45 minutes ? ?Author: ?Elmarie Shiley, MD ?10/31/2021 3:19 PM ? ?For on call review [email or web address].  ?

## 2021-10-31 NOTE — TOC Initial Note (Addendum)
Transition of Care (TOC) - Initial/Assessment Note  ? ? ?Patient Details  ?Name: Carl Barnes ?MRN: [id number] ?Date of Birth: [birth year] ? ?Transition of Care (TOC) CM/SW Contact:    ?Carl Ludwig, LCSW ?Phone Number: ?10/31/2021, 12:02 PM ? ?Clinical Narrative:                 ? ?CSW was informed that patient needs [facility] RN for wound care education.  CSW spoke to patient's wife, and she was informed that a [facility] agency will not be there everyday, it may be once or twice a week depending on what he qualifies for.  Patient wife expressed understanding, CSW asked if she had a preference for an agency, and she did not.   ? ?CSW spoke to Carl Barnes at [facility], and they can accept patient.  CSW to continue to follow patient's progress throughout discharge planning, no anticipated equipment needs. ? ?Expected Discharge Plan: [place] ?Barriers to Discharge: Continued Medical Work up ? ? ?Patient Goals and CMS Choice ?Patient states their goals for this hospitalization and ongoing recovery are:: To return back home with home health. ?CMS Medicare.gov Compare Post Acute Care list provided to:: Patient Represenative (must comment) ?Choice offered to / list presented to : Spouse ? ?Expected Discharge Plan and Services ?Expected Discharge Plan: [place] ?  ?  ?Post Acute Care Choice: Home Health ?Living arrangements for the past 2 months: [place] ?                ?  ?  ?  ?  ?  ?HH Arranged: RN ?[place] Agency: [place] ?Date HH Agency Contacted: 10/31/21 ?[place] [address]: [address] ?Representative spoke with at [place]: [place] ? ?Prior Living Arrangements/Services ?Living arrangements for the past 2 months: [place] ?Lives with:: Spouse ?Patient language and need for interpreter reviewed:: Yes ?Do you feel safe going back to the place where you live?: Yes      ?Need for Family Participation in Patient Care: Yes (Comment) ?Care giver support system in place?: No  (comment) ?  ?Criminal Activity/Legal Involvement Pertinent to Current Situation/Hospitalization: No - Comment as needed ? ?Activities of Daily Living ?Home Assistive Devices/Equipment: Eyeglasses ?ADL Screening (condition at time of admission) ?Patient's cognitive ability adequate to safely complete daily activities?: Yes ?Is the patient deaf or have difficulty hearing?: Yes ?Does the patient have difficulty seeing, even when wearing glasses/contacts?: No ?Does the patient have difficulty concentrating, remembering, or making decisions?: No ?Patient able to express need for assistance with ADLs?: Yes ?Does the patient have difficulty dressing or bathing?: No ?Independently performs ADLs?: Yes (appropriate for developmental age) ?Does the patient have difficulty walking or climbing stairs?: No ?Weakness of Legs: Both ?Weakness of Arms/Hands: Both ? ?Permission Sought/Granted ?Permission sought to share information with : Case Manager, Family Supports ?Permission granted to share information with : Yes, Release of Information Signed ? Share Information with [facility], Barnes [phone]  [phone]  Carl Barnes [phone] ? Permission granted to share info w AGENCY: [place] agencies ?   ?   ? ?Emotional Assessment ?Appearance:: Appears stated age ?  ?Affect (typically observed): Calm, Appropriate, Accepting ?Orientation: : Oriented to Self, Oriented to Place, Oriented to  Time, Oriented to Situation ?Alcohol / Substance Use: Not Applicable ?Psych Involvement: No (comment) ? ?Admission diagnosis:  Testicle pain [N50.819] ?Scrotal abscess [N49.2] ?Groin pain, right [R10.31] ?Patient Active Problem List  ? Diagnosis Date Noted  ?  Scrotal abscess 10/30/2021  ? Diabetes mellitus type 2 in nonobese ([place]) 10/30/2021  ? Non-recurrent unilateral inguinal hernia without obstruction or gangrene 10/28/2021  ? Medicare annual wellness visit, subsequent 10/19/2020  ? Healthcare maintenance 11/04/2019  ?  Dysuria 08/12/2019  ? Essential hypertension 07/13/2019  ? Left leg pain 07/13/2019  ? Lumbar radiculopathy 06/06/2019  ? Dizziness 10/29/2018  ? Stye 10/29/2018  ? Abnormal EKG 07/12/2018  ? External hemorrhoid 12/17/2016  ? CAD in native artery 09/14/2016  ? Abnormal stress test 07/06/2016  ? Syncope 05/19/2016  ? Advance care planning 05/19/2016  ? 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 Diverticulosis 04/14/2012  ? SMOKER 05/30/2008  ? RENAL CALCULUS, RECURRENT 05/15/2008  ? RECTAL BLEEDING 05/10/2008  ? CARPAL TUNNEL SYNDROME, RIGHT 10/26/2006  ? Diabetes ([place]) 05/27/2004  ? Mixed hyperlipidemia 12/21/1997  ? UNSPECIFIED HEARING LOSS 12/21/1997  ? ?PCP:  Tonia Ghent, MD ?Pharmacy:   ?[place], [address] ?[place] ?[place] [address] ?Phone: [phone] Fax: [phone] ? ? ? ? ?Social Determinants of Health (SDOH) Interventions ?  ? ?Readmission Risk Interventions ?No flowsheet data found. ? ? ?

## 2021-10-31 NOTE — Progress Notes (Signed)
?Subjective: ?Pain controlled.  No nausea or emesis.  Tolerating diet.  Afebrile. ? ?Objective: ?Vital signs in last 24 hours: ?Temp:  [97.7 ?F (36.5 ?C)-99.1 ?F (37.3 ?C)] 97.7 ?F (36.5 ?C) (03/11 0502) ?Pulse Rate:  [46-75] 46 (03/11 0502) ?Resp:  [14-18] 17 (03/11 0502) ?BP: (108-147)/(58-91) 108/67 (03/11 0502) ?SpO2:  [94 %-98 %] 97 % (03/11 0502) ?Weight:  [93.9 kg] 93.9 kg (03/10 1220) ? ?Intake/Output from previous day: ?03/10 0701 - 03/11 0700 ?In: 238.9 [I.V.:3.7; IV Piggyback:235.3] ?Out: -  ?Intake/Output this shift: ?No intake/output data recorded. ? ?Physical Exam:  ?General: Alert and oriented ?CV: RRR ?Lungs: Clear ?GU: Packing removed.  I probed the wound and no further loculations.  No purulence.  Repacked wound tightly with iodoform gauze.  Patient tolerated well. ?Ext: NT, No erythema ? ?Lab Results: ?Recent Labs  ?  10/30/21 ?1333 10/31/21 ?0250  ?HGB 14.5 13.5  ?HCT 43.8 40.7  ? ?BMET ?Recent Labs  ?  10/30/21 ?1333 10/31/21 ?0250  ?NA 136 134*  ?K 4.1 3.8  ?CL 104 100  ?CO2 24 27  ?GLUCOSE 179* 166*  ?BUN 13 14  ?CREATININE 0.78 0.83  ?CALCIUM 9.3 8.7*  ? ? ? ?Studies/Results: ?CT PELVIS W CONTRAST ? ?Result Date: 10/30/2021 ?CLINICAL DATA:  Pelvic abscess EXAM: CT PELVIS WITH CONTRAST TECHNIQUE: Multidetector CT imaging of the pelvis was performed using the standard protocol following the bolus administration of intravenous contrast. RADIATION DOSE REDUCTION: This exam was performed according to the departmental dose-optimization program which includes automated exposure control, adjustment of the mA and/or kV according to patient size and/or use of iterative reconstruction technique. CONTRAST:  143m OMNIPAQUE IOHEXOL 300 MG/ML  SOLN COMPARISON:  Ultrasound 10/30/2021, CT 10/28/2021 FINDINGS: Urinary Tract:  Urinary bladder grossly unremarkable Bowel: Sigmoid colon diverticular disease without acute wall thickening. Negative appendix Vascular/Lymphatic: Mild atherosclerosis. No aneurysm.  Mildly enlarged right inguinal lymph nodes up to 15 mm likely reactive Reproductive: Small right hydrocele. Diffuse fluid and edema within the scrotal wall. Irregular rim enhancing fluid collection measuring 5.5 by 2.7 by 5.3 cm within the right lateral scrotal wall consistent with abscess. Associated soft tissue stranding and edema at the right spermatic cord and pubic soft tissues. Other:  Negative for pelvic effusion Musculoskeletal: No acute osseous abnormality IMPRESSION: 1. 5.5 cm rim enhancing fluid collection within the right lateral scrotal region, suspicious for scrotal wall abscess. Associated cellulitis/soft tissue inflammatory changes with scrotal wall edema. 2. Mild right inguinal adenopathy is likely reactive 3. Sigmoid colon diverticular disease without acute inflammatory process Electronically Signed   By: KDonavan FoilM.D.   On: 10/30/2021 15:43  ? ?[place]LVIS LIMITED (TRANSABDOMINAL ONLY) ? ?Result Date: 10/30/2021 ?CLINICAL DATA:  Right testicular and groin pain EXAM: SCROTAL ULTRASOUND DOPPLER ULTRASOUND OF THE TESTICLES TECHNIQUE: Complete ultrasound examination of the testicles, epididymis, and other scrotal structures was performed. Color and spectral Doppler ultrasound were also utilized to evaluate blood flow to the testicles. COMPARISON:  CT abdomen/pelvis 10/28/2021 FINDINGS: Right testicle Measurements: 4.7 x 2.3 x 3.7 cm. No mass or microlithiasis visualized. Left testicle Measurements: 5.1 x 2.4 x 3.6 cm. No mass or microlithiasis visualized. Right epididymis:  Normal in size and appearance. Left epididymis:  Normal in size and appearance. Hydrocele:  Small right hydrocele. Varicocele:  None visualized. Pulsed Doppler interrogation of both testes demonstrates normal low resistance arterial and venous waveforms bilaterally. Other: 6.5 x 5 x 4.9 x 4.1 cm heterogeneously hypoechoic avascular masslike area along the right lateral scrotum which may  reflect a large abscess or hematoma.  IMPRESSION: 1. 6.5 x 4.9 x 4.1 cm heterogeneously hypoechoic avascular masslike area along the right lateral scrotum which may reflect a large abscess or hematoma. 2. No testicular torsion. Electronically Signed   By: Kathreen Devoid M.D.   On: 10/30/2021 14:48  ? ?US SCROTUM W/DOPPLER ? ?Result Date: 10/30/2021 ?CLINICAL DATA:  Right testicular and groin pain EXAM: SCROTAL ULTRASOUND DOPPLER ULTRASOUND OF THE TESTICLES TECHNIQUE: Complete ultrasound examination of the testicles, epididymis, and other scrotal structures was performed. Color and spectral Doppler ultrasound were also utilized to evaluate blood flow to the testicles. COMPARISON:  CT abdomen/pelvis 10/28/2021 FINDINGS: Right testicle Measurements: 4.7 x 2.3 x 3.7 cm. No mass or microlithiasis visualized. Left testicle Measurements: 5.1 x 2.4 x 3.6 cm. No mass or microlithiasis visualized. Right epididymis:  Normal in size and appearance. Left epididymis:  Normal in size and appearance. Hydrocele:  Small right hydrocele. Varicocele:  None visualized. Pulsed Doppler interrogation of both testes demonstrates normal low resistance arterial and venous waveforms bilaterally. Other: 6.5 x 5 x 4.9 x 4.1 cm heterogeneously hypoechoic avascular masslike area along the right lateral scrotum which may reflect a large abscess or hematoma. IMPRESSION: 1. 6.5 x 4.9 x 4.1 cm heterogeneously hypoechoic avascular masslike area along the right lateral scrotum which may reflect a large abscess or hematoma. 2. No testicular torsion. Electronically Signed   By: Kathreen Devoid M.D.   On: 10/30/2021 14:48   ? ?Assessment/Plan: ?1.  Scrotal/right groin abscess: ?-I removed dressing and repacked wound today with fresh iodoform gauze.  Patient tolerated well.  No new purulence or loculations present. ?-He is appropriate for discharge home with home health nursing for daily packing changes.  I discussed with his wife who thinks she may be able to attempt some of these dressing changes  at home. ?-Recommend completing 10-day course of Bactrim. ?-Follow-up with [facility] in 2-3 weeks for wound evaluation.  Message schedulers to arrange. ? ? LOS: 0 days  ? ?Matt R. Demarco Bacci MD ?10/31/2021, 10:06 AM ?[facility]  ?Pager: [phone] ? ? ? ?

## 2021-10-31 NOTE — Progress Notes (Signed)
?Subjective: ?Pain controlled.  No nausea or emesis.  Tolerating diet.  Afebrile. ? ?Objective: ?Vital signs in last 24 hours: ?Temp:  [97.8 ?F (36.6 ?C)-97.9 ?F (36.6 ?C)] 97.9 ?F (36.6 ?C) (03/12 0432) ?Pulse Rate:  [56-62] 60 (03/12 0432) ?Resp:  [16-18] 16 (03/12 0432) ?BP: (104-129)/(62-70) 104/62 (03/12 0432) ?SpO2:  [96 %-97 %] 96 % (03/12 0432) ? ?Intake/Output from previous day: ?03/11 0701 - 03/12 0700 ?In: 234.8 [P.O.:120; IV Piggyback:114.8] ?Out: -  ?Intake/Output this shift: ?No intake/output data recorded. ? ?UOP: NR ? ?Physical Exam:  ?General: Alert and oriented ?CV: RRR ?Lungs: Clear ?Abdomen: Soft, ND, NT ?GU: Packing in place, no purulence, nursing exchanging today ?Ext: NT, No erythema ? ?Lab Results: ?Recent Labs  ?  10/30/21 ?1333 10/31/21 ?0250  ?HGB 14.5 13.5  ?HCT 43.8 40.7  ? ?BMET ?Recent Labs  ?  10/30/21 ?1333 10/31/21 ?0250  ?NA 136 134*  ?K 4.1 3.8  ?CL 104 100  ?CO2 24 27  ?GLUCOSE 179* 166*  ?BUN 13 14  ?CREATININE 0.78 0.83  ?CALCIUM 9.3 8.7*  ? ? ? ?Studies/Results: ?CT PELVIS W CONTRAST ? ?Result Date: 10/30/2021 ?CLINICAL DATA:  Pelvic abscess EXAM: CT PELVIS WITH CONTRAST TECHNIQUE: Multidetector CT imaging of the pelvis was performed using the standard protocol following the bolus administration of intravenous contrast. RADIATION DOSE REDUCTION: This exam was performed according to the departmental dose-optimization program which includes automated exposure control, adjustment of the mA and/or kV according to patient size and/or use of iterative reconstruction technique. CONTRAST:  149m OMNIPAQUE IOHEXOL 300 MG/ML  SOLN COMPARISON:  Ultrasound 10/30/2021, CT 10/28/2021 FINDINGS: Urinary Tract:  Urinary bladder grossly unremarkable Bowel: Sigmoid colon diverticular disease without acute wall thickening. Negative appendix Vascular/Lymphatic: Mild atherosclerosis. No aneurysm. Mildly enlarged right inguinal lymph nodes up to 15 mm likely reactive Reproductive: Small right  hydrocele. Diffuse fluid and edema within the scrotal wall. Irregular rim enhancing fluid collection measuring 5.5 by 2.7 by 5.3 cm within the right lateral scrotal wall consistent with abscess. Associated soft tissue stranding and edema at the right spermatic cord and pubic soft tissues. Other:  Negative for pelvic effusion Musculoskeletal: No acute osseous abnormality IMPRESSION: 1. 5.5 cm rim enhancing fluid collection within the right lateral scrotal region, suspicious for scrotal wall abscess. Associated cellulitis/soft tissue inflammatory changes with scrotal wall edema. 2. Mild right inguinal adenopathy is likely reactive 3. Sigmoid colon diverticular disease without acute inflammatory process Electronically Signed   By: KDonavan FoilM.D.   On: 10/30/2021 15:43  ? ?[place]LVIS LIMITED (TRANSABDOMINAL ONLY) ? ?Result Date: 10/30/2021 ?CLINICAL DATA:  Right testicular and groin pain EXAM: SCROTAL ULTRASOUND DOPPLER ULTRASOUND OF THE TESTICLES TECHNIQUE: Complete ultrasound examination of the testicles, epididymis, and other scrotal structures was performed. Color and spectral Doppler ultrasound were also utilized to evaluate blood flow to the testicles. COMPARISON:  CT abdomen/pelvis 10/28/2021 FINDINGS: Right testicle Measurements: 4.7 x 2.3 x 3.7 cm. No mass or microlithiasis visualized. Left testicle Measurements: 5.1 x 2.4 x 3.6 cm. No mass or microlithiasis visualized. Right epididymis:  Normal in size and appearance. Left epididymis:  Normal in size and appearance. Hydrocele:  Small right hydrocele. Varicocele:  None visualized. Pulsed Doppler interrogation of both testes demonstrates normal low resistance arterial and venous waveforms bilaterally. Other: 6.5 x 5 x 4.9 x 4.1 cm heterogeneously hypoechoic avascular masslike area along the right lateral scrotum which may reflect a large abscess or hematoma. IMPRESSION: 1. 6.5 x 4.9 x 4.1 cm heterogeneously hypoechoic avascular masslike  area along the right  lateral scrotum which may reflect a large abscess or hematoma. 2. No testicular torsion. Electronically Signed   By: Kathreen Devoid M.D.   On: 10/30/2021 14:48  ? ?US SCROTUM W/DOPPLER ? ?Result Date: 10/30/2021 ?CLINICAL DATA:  Right testicular and groin pain EXAM: SCROTAL ULTRASOUND DOPPLER ULTRASOUND OF THE TESTICLES TECHNIQUE: Complete ultrasound examination of the testicles, epididymis, and other scrotal structures was performed. Color and spectral Doppler ultrasound were also utilized to evaluate blood flow to the testicles. COMPARISON:  CT abdomen/pelvis 10/28/2021 FINDINGS: Right testicle Measurements: 4.7 x 2.3 x 3.7 cm. No mass or microlithiasis visualized. Left testicle Measurements: 5.1 x 2.4 x 3.6 cm. No mass or microlithiasis visualized. Right epididymis:  Normal in size and appearance. Left epididymis:  Normal in size and appearance. Hydrocele:  Small right hydrocele. Varicocele:  None visualized. Pulsed Doppler interrogation of both testes demonstrates normal low resistance arterial and venous waveforms bilaterally. Other: 6.5 x 5 x 4.9 x 4.1 cm heterogeneously hypoechoic avascular masslike area along the right lateral scrotum which may reflect a large abscess or hematoma. IMPRESSION: 1. 6.5 x 4.9 x 4.1 cm heterogeneously hypoechoic avascular masslike area along the right lateral scrotum which may reflect a large abscess or hematoma. 2. No testicular torsion. Electronically Signed   By: Kathreen Devoid M.D.   On: 10/30/2021 14:48   ? ?Assessment/Plan: ?1.  Scrotal/right groin abscess: ?-Nursing perform dressing exchange. No new purulence.  ?-Wound culture with moderate GPC, final pending ?-Recommend bactrim for 10 days ?-Pt wife and HH to exchange packing daily ?-Follow-up with [facility] in 2-3 weeks for wound evaluation.  Message schedulers to arrange. ? ? LOS: 1 day  ? ?Matt R. Cristopher Ciccarelli MD ?11/01/2021, 11:16 AM ?[facility]  ?Pager: [phone] ? ? ? ?

## 2021-10-31 NOTE — Hospital Course (Addendum)
71 year old past medical history significant for diabetes type 2, hypertension, hyperlipidemia presents with right testicular and scrotal pain with swelling.  He report 3 weeks ago he was hit by his granddaughter's dog in the groin when the dog jumped up on him.  The next day he noticed a little bit of serous drainage from the right inguinal region which resolved.  Few days later he developed some pain and swelling that is progressively worsened.  The right testicular and scrotal pain has become severe in the last 2 days. ? ?Evaluation in the ED CT scan show abscess versus hematoma.  Scrotal ultrasound confirmed fluid collection.  Urology was consulted and I&D was performed with significant amount of post drain. ? ?Patient admitted for IV antibiotics and further care of scrotal abscess. He will be discharge home today with [facility].  ?

## 2021-11-01 DIAGNOSIS — E871 Hypo-osmolality and hyponatremia: Secondary | ICD-10-CM

## 2021-11-01 LAB — GLUCOSE, CAPILLARY: Glucose-Capillary: 138 mg/dL — ABNORMAL HIGH (ref 70–99)

## 2021-11-01 MED ORDER — NICOTINE 14 MG/24HR TD PT24: 14.0000 mg | MEDICATED_PATCH | Freq: Every day | TRANSDERMAL | 0 refills | Status: DC

## 2021-11-01 MED ORDER — HYDROCODONE-ACETAMINOPHEN 5-325 MG PO TABS
1.0000 | ORAL_TABLET | Freq: Four times a day (QID) | ORAL | Status: DC | PRN
  Administered 2021-11-01: 2 via ORAL
  Filled 2021-11-01: qty 2

## 2021-11-01 MED ORDER — SULFAMETHOXAZOLE-TRIMETHOPRIM 800-160 MG PO TABS: 1.0000 | ORAL_TABLET | Freq: Two times a day (BID) | ORAL | 0 refills | Status: AC

## 2021-11-01 MED ORDER — SENNOSIDES-DOCUSATE SODIUM 8.6-50 MG PO TABS: 1.0000 | ORAL_TABLET | Freq: Every evening | ORAL | 0 refills | Status: DC | PRN

## 2021-11-01 MED ORDER — HYDROCODONE-ACETAMINOPHEN 5-325 MG PO TABS: 1.0000 | ORAL_TABLET | Freq: Four times a day (QID) | ORAL | 0 refills | Status: AC | PRN

## 2021-11-01 NOTE — Discharge Summary (Signed)
Physician Discharge Summary   Patient: Carl Barnes MRN: [id number] DOB: 12-08-1950  Admit date:     10/30/2021  Discharge date: 11/01/21  Discharge Physician: Elmarie Shiley   PCP: Tonia Ghent, MD   Recommendations at discharge:   Needs to follow up with urology for further care of scrotal abscess.  Please follow final culture results.  Follow HbA1c results  Discharge Diagnoses: Principal Problem:   Scrotal abscess Active Problems:   Diabetes mellitus type 2 in nonobese [facility])   Essential hypertension   Hyponatremia  Resolved Problems:   Hyponasality  Hospital Course: 71 year old past medical history significant for diabetes type 2, hypertension, hyperlipidemia presents with right testicular and scrotal pain with swelling.  He report 3 weeks ago he was hit by his granddaughter's dog in the groin when the dog jumped up on him.  The next day he noticed a little bit of serous drainage from the right inguinal region which resolved.  Few days later he developed some pain and swelling that is progressively worsened.  The right testicular and scrotal pain has become severe in the last 2 days.  Evaluation in the ED CT scan show abscess versus hematoma.  Scrotal ultrasound confirmed fluid collection.  Urology was consulted and I&D was performed with significant amount of post drain.  Patient admitted for IV antibiotics and further care of scrotal abscess. He will be discharge home today with [place].   Assessment and Plan: * Scrotal abscess Present with scrotal pain, swelling, CT scan consistent with abscess: 5.5 cm rim enhancing fluid collection.  Urology evaluated the patient and performed I&D of abscess.  Culture growing: Gram positive cocci.  Treated  with IV clindamycin while in the hospital.  Pain better controlled. He will be discharge on Vicodin. Wife will learn to do wound care. He will be discharge on Bactrim.  Culture results will need to be follow.     Diabetes  mellitus type 2 in nonobese (HCC) Continue Semglee.  HgbA1c pending.  Resume  Metformin at discharge   Essential hypertension Not on medications. Monitor.    Hyponatremia Mild, received IV fluids.   Hyponasality-resolved as of 11/01/2021 Very mild. Continue with IV fluids.          Consultants: Urology Procedures performed: I and D of scrotal abscess.  Disposition: Home Diet recommendation:  Discharge Diet Orders (From admission, onward)     Start     Ordered   11/01/21 0000  Diet - low sodium heart healthy        11/01/21 1021           Cardiac diet DISCHARGE MEDICATION: Allergies as of 11/01/2021       Reactions   Chantix [varenicline] Other (See Comments)   Intolerant   Codeine Other (See Comments)   GI upset   Metformin And Related Other (See Comments)   Max dose '500mg'$ -'1000mg'$  a day        Medication List     TAKE these medications    Accu-Chek Aviva Plus test strip Generic drug: glucose blood CHECK SUGAR DAILY UP TO 3 TIMES DAILY. INSULIN TREATED DM2   acetaminophen 500 MG tablet Commonly known as: TYLENOL Take 1,000 mg by mouth in the morning and at bedtime.   aspirin EC 81 MG tablet Take 1 tablet (81 mg total) by mouth daily.   B-D UF III MINI PEN NEEDLES 31G X 5 MM Misc Generic drug: Insulin Pen Needle USE DAILY WITH INSULIN PEN   betamethasone  dipropionate 0.05 % cream Apply 1 application. topically daily as needed (for psoriasis- apply to affected areas).   cetirizine 10 MG tablet Commonly known as: ZYRTEC Take 10 mg by mouth daily as needed (for seasonal allergies with lawn mowing).   gabapentin 600 MG tablet Commonly known as: NEURONTIN Take 600 mg by mouth in the morning and at bedtime.   HYDROcodone-acetaminophen 5-325 MG tablet Commonly known as: NORCO/VICODIN Take 1-2 tablets by mouth every 6 (six) hours as needed for up to 5 days for moderate pain.   Lantus SoloStar 100 UNIT/ML Solostar Pen Generic drug: insulin  glargine INJECT 32 UNITS UNDER THE SKIN ONCE DAILY What changed:  how much to take when to take this additional instructions   metFORMIN 500 MG tablet Commonly known as: GLUCOPHAGE TAKE 1 TABLET BY MOUTH AT BEDTIME What changed: when to take this   nicotine 14 mg/24hr patch Commonly known as: NICODERM CQ - dosed in mg/24 hours Place 1 patch (14 mg total) onto the skin daily. Start taking on: November 02, 2021   pravastatin 40 MG tablet Commonly known as: PRAVACHOL TAKE 1 TABLET BY MOUTH EVERY NIGHT AT BEDTIME   senna-docusate 8.6-50 MG tablet Commonly known as: Senokot-S Take 1 tablet by mouth at bedtime as needed for mild constipation.   sulfamethoxazole-trimethoprim 800-160 MG tablet Commonly known as: BACTRIM DS Take 1 tablet by mouth 2 (two) times daily for 10 days.               Discharge Care Instructions  (From admission, onward)           Start     Ordered   11/01/21 0000  Discharge wound care:       Comments: Pack wound daily with  iodoform gauze   11/01/21 1021   11/01/21 0000  Discharge wound care:       Comments: Pack wound daily with  iodoform gauze   11/01/21 1021            Follow-up Information     [facility]. Schedule an appointment as soon as possible for a visit.   Contact information: [place] [phone]               Discharge Exam: Danley Danker Weights   10/30/21 1220  Weight: 93.9 kg  General; NAD Lungs CTA   Condition at discharge: stable  The results of significant diagnostics from this hospitalization (including imaging, microbiology, ancillary and laboratory) are listed below for reference.   Imaging Studies: CT Abdomen Pelvis Wo Contrast  Result Date: 10/28/2021 CLINICAL DATA:  Right groin swelling for 2 weeks. Concern for incarcerated hernia. EXAM: CT ABDOMEN AND PELVIS WITHOUT CONTRAST TECHNIQUE: Multidetector CT imaging of the abdomen and pelvis  was performed following the standard protocol without IV contrast. RADIATION DOSE REDUCTION: This exam was performed according to the departmental dose-optimization program which includes automated exposure control, adjustment of the mA and/or kV according to patient size and/or use of iterative reconstruction technique. COMPARISON:  CT 08/12/2010. FINDINGS: Lower chest: Clear lung bases. No significant pleural or pericardial effusion. Coronary and aortic atherosclerosis noted. Hepatobiliary: The liver demonstrates diffusely decreased density consistent with steatosis. There is chronic parenchymal atrophy within the left hepatic lobe which appears unchanged. A previously demonstrated hemangioma posteriorly in the right hepatic lobe is not well seen on this noncontrast study. Previously noted pneumobilia is no longer demonstrated. Mild extrahepatic biliary dilatation is unchanged status post cholecystectomy. Pancreas:  Unremarkable. No pancreatic ductal dilatation or surrounding inflammatory changes. Spleen: Normal in size without focal abnormality. Adrenals/Urinary Tract: There are stable small low-density adrenal adenomas bilaterally. Both kidneys appear unremarkable as imaged in the noncontrast state. There is no evidence of urinary tract calculus, hydronephrosis or perinephric soft tissue stranding. The bladder appears unremarkable. Stomach/Bowel: No enteric contrast was administered. The stomach appears unremarkable for its degree of distension. No evidence of bowel wall thickening, distention or surrounding inflammatory change. There is a stable small duodenal diverticulum. The appendix appears normal. There is scattered descending and sigmoid colon diverticulosis. Vascular/Lymphatic: There are no enlarged abdominopelvic lymph nodes. There are small asymmetric right inguinal lymph nodes which measure up to 1.5 cm on image 88/2. Aortic and branch vessel atherosclerosis without acute vascular findings on  noncontrast imaging. Reproductive: The prostate gland and seminal vesicles appear unremarkable. Other: A small amount of fat is present in both inguinal canals, unchanged. No discrete hernia identified. However, there is asymmetric soft tissue stranding in the subcutaneous fat of the medial right groin and upper scrotum, incompletely visualized. No foreign body, soft tissue emphysema or fluid collection is seen in this area. There is no herniated bowel. Musculoskeletal: No acute or significant osseous findings. Mild spondylosis. IMPRESSION: 1. Soft tissue stranding in the subcutaneous fat of the right groin and upper scrotum, incompletely visualized. No discrete hernia is demonstrated. These findings may be posttraumatic or inflammatory (cellulitis). Correlate clinically and consider scrotal ultrasound for further evaluation. 2. Mildly prominent asymmetric right inguinal lymph nodes, likely reactive. 3. Stable incidental findings including bilateral adrenal adenomas, mild biliary dilatation post cholecystectomy, distal colonic diverticulosis and Aortic Atherosclerosis (ICD10-I70.0). Electronically Signed   By: Richardean Sale M.D.   On: 10/28/2021 14:03   CT PELVIS W CONTRAST  Result Date: 10/30/2021 CLINICAL DATA:  Pelvic abscess EXAM: CT PELVIS WITH CONTRAST TECHNIQUE: Multidetector CT imaging of the pelvis was performed using the standard protocol following the bolus administration of intravenous contrast. RADIATION DOSE REDUCTION: This exam was performed according to the departmental dose-optimization program which includes automated exposure control, adjustment of the mA and/or kV according to patient size and/or use of iterative reconstruction technique. CONTRAST:  125m OMNIPAQUE IOHEXOL 300 MG/ML  SOLN COMPARISON:  Ultrasound 10/30/2021, CT 10/28/2021 FINDINGS: Urinary Tract:  Urinary bladder grossly unremarkable Bowel: Sigmoid colon diverticular disease without acute wall thickening. Negative appendix  Vascular/Lymphatic: Mild atherosclerosis. No aneurysm. Mildly enlarged right inguinal lymph nodes up to 15 mm likely reactive Reproductive: Small right hydrocele. Diffuse fluid and edema within the scrotal wall. Irregular rim enhancing fluid collection measuring 5.5 by 2.7 by 5.3 cm within the right lateral scrotal wall consistent with abscess. Associated soft tissue stranding and edema at the right spermatic cord and pubic soft tissues. Other:  Negative for pelvic effusion Musculoskeletal: No acute osseous abnormality IMPRESSION: 1. 5.5 cm rim enhancing fluid collection within the right lateral scrotal region, suspicious for scrotal wall abscess. Associated cellulitis/soft tissue inflammatory changes with scrotal wall edema. 2. Mild right inguinal adenopathy is likely reactive 3. Sigmoid colon diverticular disease without acute inflammatory process Electronically Signed   By: KDonavan FoilM.D.   On: 10/30/2021 15:43   [place]PELVIS LIMITED (TRANSABDOMINAL ONLY)  Result Date: 10/30/2021 CLINICAL DATA:  Right testicular and groin pain EXAM: SCROTAL ULTRASOUND DOPPLER ULTRASOUND OF THE TESTICLES TECHNIQUE: Complete ultrasound examination of the testicles, epididymis, and other scrotal structures was performed. Color and spectral Doppler ultrasound were also utilized to evaluate blood flow to the testicles. COMPARISON:  CT abdomen/pelvis  10/28/2021 FINDINGS: Right testicle Measurements: 4.7 x 2.3 x 3.7 cm. No mass or microlithiasis visualized. Left testicle Measurements: 5.1 x 2.4 x 3.6 cm. No mass or microlithiasis visualized. Right epididymis:  Normal in size and appearance. Left epididymis:  Normal in size and appearance. Hydrocele:  Small right hydrocele. Varicocele:  None visualized. Pulsed Doppler interrogation of both testes demonstrates normal low resistance arterial and venous waveforms bilaterally. Other: 6.5 x 5 x 4.9 x 4.1 cm heterogeneously hypoechoic avascular masslike area along the right lateral  scrotum which may reflect a large abscess or hematoma. IMPRESSION: 1. 6.5 x 4.9 x 4.1 cm heterogeneously hypoechoic avascular masslike area along the right lateral scrotum which may reflect a large abscess or hematoma. 2. No testicular torsion. Electronically Signed   By: Kathreen Devoid M.D.   On: 10/30/2021 14:48   US SCROTUM W/DOPPLER  Result Date: 10/30/2021 CLINICAL DATA:  Right testicular and groin pain EXAM: SCROTAL ULTRASOUND DOPPLER ULTRASOUND OF THE TESTICLES TECHNIQUE: Complete ultrasound examination of the testicles, epididymis, and other scrotal structures was performed. Color and spectral Doppler ultrasound were also utilized to evaluate blood flow to the testicles. COMPARISON:  CT abdomen/pelvis 10/28/2021 FINDINGS: Right testicle Measurements: 4.7 x 2.3 x 3.7 cm. No mass or microlithiasis visualized. Left testicle Measurements: 5.1 x 2.4 x 3.6 cm. No mass or microlithiasis visualized. Right epididymis:  Normal in size and appearance. Left epididymis:  Normal in size and appearance. Hydrocele:  Small right hydrocele. Varicocele:  None visualized. Pulsed Doppler interrogation of both testes demonstrates normal low resistance arterial and venous waveforms bilaterally. Other: 6.5 x 5 x 4.9 x 4.1 cm heterogeneously hypoechoic avascular masslike area along the right lateral scrotum which may reflect a large abscess or hematoma. IMPRESSION: 1. 6.5 x 4.9 x 4.1 cm heterogeneously hypoechoic avascular masslike area along the right lateral scrotum which may reflect a large abscess or hematoma. 2. No testicular torsion. Electronically Signed   By: Kathreen Devoid M.D.   On: 10/30/2021 14:48    Microbiology: Results for orders placed or performed during the hospital encounter of 10/30/21  Resp Panel by RT-PCR (Flu A&B, Covid) Nasopharyngeal Swab     Status: None   Collection Time: 10/30/21  6:49 PM   Specimen: Nasopharyngeal Swab; Nasopharyngeal(NP) swabs in vial transport medium  Result Value Ref Range  Status   SARS Coronavirus 2 by RT PCR NEGATIVE NEGATIVE Final    Comment: (NOTE) SARS-CoV-2 target nucleic acids are NOT DETECTED.  The SARS-CoV-2 RNA is generally detectable in upper respiratory specimens during the acute phase of infection. The lowest concentration of SARS-CoV-2 viral copies this assay can detect is 138 copies/mL. A negative result does not preclude SARS-Cov-2 infection and should not be used as the sole basis for treatment or other patient management decisions. A negative result may occur with  improper specimen collection/handling, submission of specimen other than nasopharyngeal swab, presence of viral mutation(s) within the areas targeted by this assay, and inadequate number of viral copies(<138 copies/mL). A negative result must be combined with clinical observations, patient history, and epidemiological information. The expected result is Negative.  Fact Sheet for Patients:  EntrepreneurPulse.com.au  Fact Sheet for Healthcare Providers:  IncredibleEmployment.be  This test is no t yet approved or cleared by the [place] FDA and  has been authorized for detection and/or diagnosis of SARS-CoV-2 by FDA under an Emergency Use Authorization (EUA). This EUA will remain  in effect (meaning this test can be used) for the duration of the COVID-19 declaration  under Section 564(b)(1) of the Act, 21 U.S.C.section 360bbb-3(b)(1), unless the authorization is terminated  or revoked sooner.       Influenza A by PCR NEGATIVE NEGATIVE Final   Influenza B by PCR NEGATIVE NEGATIVE Final    Comment: (NOTE) The Xpert Xpress SARS-CoV-2/FLU/RSV plus assay is intended as an aid in the diagnosis of influenza from Nasopharyngeal swab specimens and should not be used as a sole basis for treatment. Nasal washings and aspirates are unacceptable for Xpert Xpress SARS-CoV-2/FLU/RSV testing.  Fact Sheet for  Patients: EntrepreneurPulse.com.au  Fact Sheet for Healthcare Providers: IncredibleEmployment.be  This test is not yet approved or cleared by the [place] FDA and has been authorized for detection and/or diagnosis of SARS-CoV-2 by FDA under an Emergency Use Authorization (EUA). This EUA will remain in effect (meaning this test can be used) for the duration of the COVID-19 declaration under Section 564(b)(1) of the Act, 21 U.S.C. section 360bbb-3(b)(1), unless the authorization is terminated or revoked.  Performed at [facility], [place] [address] [place], [place] [address]   Aerobic Culture w Gram Stain (superficial specimen)     Status: None (Preliminary result)   Collection Time: 10/30/21  7:00 PM   Specimen: Abscess; Wound  Result Value Ref Range Status   Specimen Description   Final    ABSCESS Performed at [place] [address], [place], [place] [address]    Special Requests   Final    NONE Performed at [facility], [place] [address] [place], [place] [address]    Gram Stain   Final    NO SQUAMOUS EPITHELIAL CELLS SEEN MODERATE WBC SEEN MODERATE GRAM POSITIVE COCCI    Culture   Final    ABUNDANT STREPTOCOCCUS AGALACTIAE TESTING AGAINST S. AGALACTIAE NOT ROUTINELY PERFORMED DUE TO PREDICTABILITY OF AMP/PEN/VAN SUSCEPTIBILITY. Performed at [facility], [place] [address] [place], [place] [address]    Report Status PENDING  Incomplete    Labs: CBC: Recent Labs  Lab 10/30/21 1333 10/31/21 0250  WBC 10.1 9.7  NEUTROABS 7.9*  --   HGB 14.5 13.5  HCT 43.8 40.7  MCV 95.6 98.1  PLT 148* 662   Basic Metabolic Panel: Recent Labs  Lab 10/30/21 1333 10/31/21 0250  NA 136 134*  K 4.1 3.8  CL 104 100  CO2 24 27  GLUCOSE 179* 166*  BUN 13 14  CREATININE 0.78 0.83  CALCIUM 9.3 8.7*   Liver Function Tests: No results for input(s): AST, ALT, ALKPHOS, BILITOT,  PROT, ALBUMIN in the last 168 hours. CBG: Recent Labs  Lab 10/31/21 0708 10/31/21 1149 10/31/21 1635 10/31/21 2057 11/01/21 0728  GLUCAP 158* 190* 178* 203* 138*    Discharge time spent: greater than 30 minutes.  Signed: Elmarie Shiley, MD [facility] 11/01/2021

## 2021-11-01 NOTE — Assessment & Plan Note (Signed)
Mild, received IV fluids.  ?

## 2021-11-01 NOTE — Plan of Care (Signed)
  Problem: Pain Managment: Goal: General experience of comfort will improve Outcome: Progressing   

## 2021-11-01 NOTE — Plan of Care (Signed)
Discharge instructions given to the patient and his wife including medications. Educated and provided supplies for home wound care to wife. ?

## 2021-11-01 NOTE — Plan of Care (Signed)
  Problem: Pain Managment: Goal: General experience of comfort will improve Outcome: Progressing   Problem: Clinical Measurements: Goal: Ability to maintain clinical measurements within normal limits will improve Outcome: Progressing   

## 2021-11-02 LAB — AEROBIC CULTURE W GRAM STAIN (SUPERFICIAL SPECIMEN): Gram Stain: NONE SEEN

## 2021-11-02 LAB — HEMOGLOBIN A1C
Hgb A1c MFr Bld: 8.6 % — ABNORMAL HIGH (ref 4.8–5.6)
Mean Plasma Glucose: 200 mg/dL

## 2021-11-15 ENCOUNTER — Other Ambulatory Visit: Payer: Self-pay | Admitting: Family Medicine

## 2021-11-15 DIAGNOSIS — Z125 Encounter for screening for malignant neoplasm of prostate: Secondary | ICD-10-CM

## 2021-11-15 DIAGNOSIS — E119 Type 2 diabetes mellitus without complications: Secondary | ICD-10-CM

## 2021-11-20 ENCOUNTER — Ambulatory Visit: Payer: Medicare Other | Admitting: Physician Assistant

## 2021-11-25 ENCOUNTER — Other Ambulatory Visit (INDEPENDENT_AMBULATORY_CARE_PROVIDER_SITE_OTHER): Payer: Medicare Other

## 2021-11-25 DIAGNOSIS — E119 Type 2 diabetes mellitus without complications: Secondary | ICD-10-CM

## 2021-11-25 DIAGNOSIS — Z794 Long term (current) use of insulin: Secondary | ICD-10-CM

## 2021-11-25 DIAGNOSIS — Z125 Encounter for screening for malignant neoplasm of prostate: Secondary | ICD-10-CM

## 2021-11-25 LAB — COMPREHENSIVE METABOLIC PANEL
ALT: 21 U/L (ref 0–53)
AST: 15 U/L (ref 0–37)
Albumin: 4.2 g/dL (ref 3.5–5.2)
Alkaline Phosphatase: 57 U/L (ref 39–117)
BUN: 14 mg/dL (ref 6–23)
CO2: 27 mEq/L (ref 19–32)
Calcium: 9.4 mg/dL (ref 8.4–10.5)
Chloride: 104 mEq/L (ref 96–112)
Creatinine, Ser: 0.88 mg/dL (ref 0.40–1.50)
GFR: 86.43 mL/min (ref >60.00)
Glucose, Bld: 220 mg/dL — ABNORMAL HIGH (ref 70–99)
Potassium: 4.6 mEq/L (ref 3.5–5.1)
Sodium: 138 mEq/L (ref 135–145)
Total Bilirubin: 0.6 mg/dL (ref 0.2–1.2)
Total Protein: 6.4 g/dL (ref 6.0–8.3)

## 2021-11-25 LAB — PSA, MEDICARE: PSA: 0.42 ng/ml (ref 0.10–4.00)

## 2021-11-25 LAB — LIPID PANEL
Cholesterol: 110 mg/dL (ref 0–200)
HDL: 31.1 mg/dL — ABNORMAL LOW (ref >39.00)
LDL Cholesterol: 45 mg/dL (ref 0–99)
NonHDL: 79.35
Total CHOL/HDL Ratio: 4
Triglycerides: 174 mg/dL — ABNORMAL HIGH (ref 0.0–149.0)
VLDL: 34.8 mg/dL (ref 0.0–40.0)

## 2021-11-25 LAB — MICROALBUMIN / CREATININE URINE RATIO
Creatinine,U: 171.3 mg/dL
Microalb Creat Ratio: 0.5 mg/g (ref 0.0–30.0)
Microalb, Ur: 0.8 mg/dL (ref 0.0–1.9)

## 2021-11-27 ENCOUNTER — Encounter: Payer: Self-pay | Admitting: Medical

## 2021-11-27 ENCOUNTER — Telehealth: Payer: Self-pay | Admitting: Medical

## 2021-11-27 ENCOUNTER — Ambulatory Visit: Payer: Medicare Other | Admitting: Medical

## 2021-11-27 VITALS — BP 140/78 | HR 55 | Ht 71.0 in | Wt 210.0 lb

## 2021-11-27 DIAGNOSIS — R030 Elevated blood-pressure reading, without diagnosis of hypertension: Secondary | ICD-10-CM

## 2021-11-27 DIAGNOSIS — I2510 Atherosclerotic heart disease of native coronary artery without angina pectoris: Secondary | ICD-10-CM

## 2021-11-27 DIAGNOSIS — E785 Hyperlipidemia, unspecified: Secondary | ICD-10-CM | POA: Diagnosis not present

## 2021-11-27 DIAGNOSIS — R0609 Other forms of dyspnea: Secondary | ICD-10-CM

## 2021-11-27 MED ORDER — CARVEDILOL 3.125 MG PO TABS: 3.1250 mg | ORAL_TABLET | Freq: Two times a day (BID) | ORAL | 3 refills | Status: DC

## 2021-11-27 NOTE — Telephone Encounter (Signed)
Left voicemail message on spouses number to call back so we can review some changes.  ?

## 2021-11-27 NOTE — Patient Instructions (Addendum)
Medication Instructions:  ?Your physician has recommended you make the following change in your medication:  ? ?Carvedilol discontinued per provider. Pharmacy has been called and prescription had not been picked up. They returned to stock and removed from patients profile.  ? ?*If you need a refill on your cardiac medications before your next appointment, please call your pharmacy* ? ? ?Lab Work: ?None ? ?If you have labs (blood work) drawn today and your tests are completely normal, you will receive your results only by: ?MyChart Message (if you have MyChart) OR ?A paper copy in the mail ?If you have any lab test that is abnormal or we need to change your treatment, we will call you to review the results. ? ? ?Testing/Procedures: ?Your physician has requested that you have an echocardiogram. Echocardiography is a painless test that uses sound waves to create images of your heart. It provides your doctor with information about the size and shape of your heart and how well your heart?s chambers and valves are working. This procedure takes approximately one hour. There are no restrictions for this procedure. ? ?[place] ? ?Your caregiver has ordered a Stress Test with nuclear imaging. The purpose of this test is to evaluate the blood supply to your heart muscle. This procedure is referred to as a "Non-Invasive Stress Test." This is because other than having an IV started in your vein, nothing is inserted or "invades" your body. Cardiac stress tests are done to find areas of poor blood flow to the heart by determining the extent of coronary artery disease (CAD). Some patients exercise on a treadmill, which naturally increases the blood flow to your heart, while others who are  unable to walk on a treadmill due to physical limitations have a pharmacologic/chemical stress agent called Lexiscan . This medicine will mimic walking on a treadmill by temporarily increasing your coronary blood flow.  ? ?Please note: these  test may take anywhere between 2-4 hours to complete ? ?PLEASE REPORT TO [facility] MEDICAL MALL ENTRANCE  ?THE VOLUNTEERS AT THE FIRST DESK WILL DIRECT YOU WHERE TO GO ? ?Date of Procedure:_______________________ ? ?Arrival Time for Procedure:_____________________________ ? ?Instructions regarding medication:  ? ?_XX___ : Hold diabetes medication morning of procedure ? ?_XX___:  HOLD metformin the night before ? ?_XX___:  The night before take ONLY 16 units of your Lantus.  ? ?PLEASE NOTIFY THE OFFICE AT LEAST 96 HOURS IN ADVANCE IF YOU ARE UNABLE TO KEEP YOUR APPOINTMENT.  [phone] ?AND  ?PLEASE NOTIFY NUCLEAR MEDICINE AT [facility] AT LEAST 38 HOURS IN ADVANCE IF YOU ARE UNABLE TO KEEP YOUR APPOINTMENT. [phone] ? ?How to prepare for your Myoview test: ? ?Do not eat or drink after midnight ?No caffeine for 24 hours prior to test ?No smoking 24 hours prior to test. ?Your medication may be taken with water.  If your doctor stopped a medication because of this test, do not take that medication. ?Ladies, please do not wear dresses.  Skirts or pants are appropriate. Please wear a short sleeve shirt. ?No perfume, cologne or lotion. ?Wear comfortable walking shoes. No heels! ? ? ? ?Follow-Up: ?At [facility], you and your health needs are our priority.  As part of our continuing mission to provide you with exceptional heart care, we have created designated Provider Care Teams.  These Care Teams include your primary Cardiologist (physician) and Advanced Practice Providers (APPs -  Physician Assistants and Nurse Practitioners) who all work together to provide you with the care you need,  when you need it. ? ? ?Your next appointment:   ?4-6 week(s) ? ?The format for your next appointment:   ?In Person ? ?Provider:   ?Nelva Bush, MD or Cadence Kathlen Mody, PA-C ?

## 2021-11-27 NOTE — Progress Notes (Signed)
?Cardiology Office Note:   ? ?Date:  11/27/2021  ? ?ID:  [name], DOB 19-Jul-1951, MRN [id number] ? ?PCP:  Tonia Ghent, MD  ?[facility] Cardiologist:  Nelva Bush, MD  ?[facility] Electrophysiologist:  None  ? ?Referring MD: Tonia Ghent, MD  ? ?Chief Complaint: 12 month follow-up ? ?History of Present Illness:   ? ?Carl Barnes is a 71 y.o. male with a hx of nonobstructive CAD, HLD, M2, tobacco use who presents for annual follow-up.  ? ?Patient also had an abnormal stress test in October 2017 suggestive of inferior and inferoseptal ischemia.  Subsequent catheterization in early November 2017 revealed minimal nonobstructive CAD. ? ?Heart monitor was ordered in 12/2019 for dizziness which showed NSR, no sustained arrhythmia or prolonged pause.  ? ?Last seen 07/23/20 and was doing well from a cardiac perspective.  ? ?Today, the patient reports he was in the hospital last month for scrotal abscess, this has resolved. The patient reports progressive generalized fatigue. Feels he gets tired quickly when working. He is very active, always doing work. No chest pain, LLE, orthopnea, pnd, palpitations. BP is a little high, says it is normally not this high. However was high in the hospital.  ? ?Past Medical History:  ?Diagnosis Date  ? Cholangitis 12/11  ? [facility] admission - ERCP for stone removal per GI  ? Diabetes mellitus without complication ([place])   ? Elevated glucose 10/16 to 06/09/2004  ? [place] admission, mild elevated cholesterol  ? History of echocardiogram   ? a. 05/2017 Echo: EF 55-60%, mild LVH, no rwma, mild MR.  ? Hyperlipidemia 12/21/1997  ? Morbid obesity ([place])   ? Non-obstructive CAD (coronary artery disease)   ? a. 05/2016 Abnl St Test: intermediate risk, large, partially reversible inf/infsept defect consistent w/ ischemia/? scar;  b. 06/2016 Cath: LM nl, LAD 20m RI nl, LCX nl, OM1/2/3 nl, RCA 40p/m, 30d, RPDA/RPAV nl.  ? Poisoning by black widow spider bite 1979  ? RBBB   ? Renal stones    ? Tobacco abuse   ? ? ?Past Surgical History:  ?Procedure Laterality Date  ? CARDIAC CATHETERIZATION Left 07/06/2016  ? Procedure: Left Heart Cath and Coronary Angiography;  Surgeon: CNelva Bush MD;  [facility];  Service: Cardiovascular;  Laterality: Left;  ? Cardiolite  06/08/2004  ? WNL  ? CATARACT EXTRACTION W/ INTRAOCULAR LENS IMPLANT Right 10/13/2017  ? Dr. TSatira Sark ? CATARACT EXTRACTION W/ INTRAOCULAR LENS IMPLANT Left 09/08/2017  ? Dr.Tanner  ? Chest CT w/contrast  06/08/04  ? Negative PE  ? CHOLECYSTECTOMY  01/2011  ? Diseased GB.  [facility]  ? Head MRI  12/99  ? Normal (R/O acoustic neuroma)  ? ? ?Current Medications: ?Current Meds  ?Medication Sig  ? ACCU-CHEK AVIVA PLUS test strip CHECK SUGAR DAILY UP TO 3 TIMES DAILY. INSULIN TREATED DM2  ? acetaminophen (TYLENOL) 500 MG tablet Take 1,000 mg by mouth in the morning and at bedtime.  ? aspirin EC 81 MG tablet Take 1 tablet (81 mg total) by mouth daily.  ? B-D UF III MINI PEN NEEDLES 31G X 5 MM MISC USE DAILY WITH INSULIN PEN  ? carvedilol (COREG) 3.125 MG tablet Take 1 tablet (3.125 mg total) by mouth 2 (two) times daily with a meal.  ? cetirizine (ZYRTEC) 10 MG tablet Take 10 mg by mouth daily as needed (for seasonal allergies with lawn mowing).   ? gabapentin (NEURONTIN) 600 MG tablet Take 600 mg by  mouth in the morning and at bedtime.  ? insulin glargine (LANTUS SOLOSTAR) 100 UNIT/ML Solostar Pen INJECT 32 UNITS UNDER THE SKIN ONCE DAILY (Patient taking differently: 32 Units at bedtime.)  ? metFORMIN (GLUCOPHAGE) 500 MG tablet TAKE 1 TABLET BY MOUTH AT BEDTIME (Patient taking differently: Take 500 mg by mouth daily after supper.)  ? pravastatin (PRAVACHOL) 40 MG tablet TAKE 1 TABLET BY MOUTH EVERY NIGHT AT BEDTIME (Patient taking differently: Take 40 mg by mouth at bedtime.)  ?  ? ?Allergies:   Chantix [varenicline], Codeine, and Metformin and related  ? ?Social History  ? ?Socioeconomic History  ? Marital status: Married  ?  Spouse  name: Not on file  ? Number of children: 1  ? Years of education: Not on file  ? Highest education level: Not on file  ?Occupational History  ? Occupation: PLANT SUPERVISOR  ?  Employer: UNEMPLOYED  ?  Comment: Electronics engineer  ?Tobacco Use  ? Smoking status: Every Day  ?  Packs/day: 0.25  ?  Years: 35.00  ?  Pack years: 8.75  ?  Types: Cigarettes  ? Smokeless tobacco: Never  ?Vaping Use  ? Vaping Use: Former  ?Substance and Sexual Activity  ? Alcohol use: Yes  ?  Alcohol/week: 2.0 standard drinks  ?  Types: 2 Shots of liquor per week  ?  Comment: socially  ? Drug use: No  ? Sexual activity: Not on file  ?Other Topics Concern  ? Not on file  ?Social History Narrative  ? Married, 2nd marriage 1997  ? 1 adult son  ? 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 [phone], [place] vet, [employer], no service related issues  ? [employer] fan  ? ?Social Determinants of Health  ? ?Financial Resource Strain: Not on file  ?Food Insecurity: Not on file  ?Transportation Needs: Not on file  ?Physical Activity: Not on file  ?Stress: Not on file  ?Social Connections: Not on file  ?  ? ?Family History: ?The patient's family history includes CVA in his father; Cancer in his mother; Heart disease in his brother and paternal grandfather; Hyperlipidemia in his mother; Stroke in his brother. There is no history of Diabetes, Alcohol abuse, Drug abuse, Depression, Colon cancer, or Prostate cancer. ? ?ROS:   ?Please see the history of present illness.    ? All other systems reviewed and are negative. ? ?EKGs/Labs/Other Studies Reviewed:   ? ?The following studies were reviewed today: ? ?Heart monitor 12/2019 ?The patient was monitored for 9 days, 14 hours. ?The predominant rhythm was sinus with an average rate of 71 bpm (range 46-111 bpm in sinus). ?There were rare PAC's and PVC's. Two atrial runs lasting up to 5 beats with a maximum rate of 176 bpm occurred. ?No sustained arrhythmia or prolonged pause was observed. ?Patient  triggered events correspond primarily to sinus rhythm, though one event also contains an isolated PAC. ?  ?Predominantly sinus rhythm with rare PAC's and PVC's, as well as two brief atrial runs.  No significant arrhythmia identified. ?  ? ?Cardiac cath 06/2016 ?Conclusions: ?Mild to moderate, non-obstructive coronary artery disease, including 40% mid LAD stenosis and serial 30-40% proximal, mid, and distal RCA lesions. ?Normal left ventricular filling pressure. ?  ?Recommendations: ?Medical therapy to prevent progression of non-obstructive CAD.  Will add atorvastatin 20 mg daily. ?No high-grade stenosis to explain syncope. ?Follow-up with Dr. Yvone Neu as an outpatient. ?  ?Nelva Bush, MD ?[place] ?Pager: [phone] ? ?  Echo 05/2016 ?Study Conclusions  ? ?- Left ventricle: The cavity size was normal. Wall thickness was  ?  increased in a pattern of mild LVH. Systolic function was normal.  ?  The estimated ejection fraction was in the range of 55% to 60%.  ?  Wall motion was normal; there were no regional wall motion  ?  abnormalities. Left ventricular diastolic function parameters  ?  were normal for the patient&'s age.  ?- Mitral valve: There was mild regurgitation.  ? ?EKG:  EKG is  ordered today.  The ekg ordered today demonstrates SB, 1st degree AV block, RBBB, LAD, nonspecific T wave changes, LVH ? ?Recent Labs: ?10/31/2021: Hemoglobin 13.5; Platelets 158 ?11/25/2021: ALT 21; BUN 14; Creatinine, Ser 0.88; Potassium 4.6; Sodium 138  ?Recent Lipid Panel ?   ?Component Value Date/Time  ? CHOL 110 11/25/2021 0931  ? TRIG 174.0 (H) 11/25/2021 0931  ? HDL 31.10 (L) 11/25/2021 0931  ? CHOLHDL 4 11/25/2021 0931  ? VLDL 34.8 11/25/2021 0931  ? Oso 45 11/25/2021 0931  ? LDLDIRECT 60.0 10/06/2020 0734  ? ? ? ?Physical Exam:   ? ?VS:  BP 140/78 (BP Location: Left Arm, Patient Position: Sitting, Cuff Size: Normal)   Pulse (!) 55   Ht '5\' 11"'$  (1.803 m)   Wt 210 lb (95.3 kg)   SpO2 98%   BMI 29.29 kg/m?     ? ?Wt Readings from Last 3 Encounters:  ?11/27/21 210 lb (95.3 kg)  ?10/30/21 207 lb (93.9 kg)  ?10/28/21 207 lb 6 oz (94.1 kg)  ?  ? ?GEN:  Well nourished, well developed in no acute distress ?HEENT: Normal ?

## 2021-11-30 ENCOUNTER — Ambulatory Visit (INDEPENDENT_AMBULATORY_CARE_PROVIDER_SITE_OTHER): Payer: Medicare Other | Admitting: Family Medicine

## 2021-11-30 ENCOUNTER — Encounter: Payer: Self-pay | Admitting: Family Medicine

## 2021-11-30 VITALS — BP 132/72 | HR 59 | Temp 97.3°F | Wt 211.0 lb

## 2021-11-30 DIAGNOSIS — Z0000 Encounter for general adult medical examination without abnormal findings: Secondary | ICD-10-CM

## 2021-11-30 DIAGNOSIS — Z7189 Other specified counseling: Secondary | ICD-10-CM

## 2021-11-30 DIAGNOSIS — I2510 Atherosclerotic heart disease of native coronary artery without angina pectoris: Secondary | ICD-10-CM

## 2021-11-30 DIAGNOSIS — E119 Type 2 diabetes mellitus without complications: Secondary | ICD-10-CM

## 2021-11-30 MED ORDER — METFORMIN HCL 500 MG PO TABS: 500.0000 mg | ORAL_TABLET | Freq: Every day | ORAL | 3 refills | Status: DC

## 2021-11-30 NOTE — Patient Instructions (Signed)
Plan on recheck A1c at a visit in about 3 months.   ?Take care.  Glad to see you. ?We'll likely need to alter your diabetes meds but I think it makes sense to get through the stress test first.  ?

## 2021-11-30 NOTE — Progress Notes (Signed)
I have personally reviewed the Medicare Annual Wellness questionnaire and have noted ?1. The patient's medical and social history ?2. Their use of alcohol, tobacco or illicit drugs ?3. Their current medications and supplements ?4. The patient's functional ability including ADL's, fall risks, home safety risks and hearing or visual ?            impairment. ?5. Diet and physical activities ?6. Evidence for depression or mood disorders ? ?The patients weight, height, BMI have been recorded in the chart and visual acuity is per eye clinic.  ?I have made referrals, counseling and provided education to the patient based review of the above and I have provided the pt with a written personalized care plan for preventive services. ? ?Provider list updated- see scanned forms.  Routine anticipatory guidance given to patient.  See health maintenance. The possibility exists that previously documented standard health maintenance information may have been brought forward from a previous encounter into this note.  If needed, that same information has been updated to reflect the current situation based on today's encounter.   ? ?Flu 2023 ?Shingles d/w pt.  He had zostavax prev.  ?PNA UTD ?Tetanus 2019 ?Covid prev done.  ?Colonoscopy 2013 ?Prostate cancer screening 2023 ?Advance directive- wife designated if patient were incapacitated.   ?Cognitive function addressed- see scanned forms- and if abnormal then additional documentation follows.  ? ?In addition to [facility], follow up visit for the below conditions: ? ?Diabetes:  ?Using medications without difficulties: yes ?Hypoglycemic episodes:no recent issues, cautions d/w pt.  Lowest ~110.   ?Hyperglycemic episodes:no ?Feet problems: no ?Blood Sugars averaging: usually 130-140 in the AM with inc after meals.  D/w pt   ?eye exam within last year: ?A1c above goal at 8.6.  we agreed to address after stress test.  He is off farxiga.  He had urinary urgency with '5mg'$  a day.    ? ?DOE, ie fatigue with exertion.  Noted in the last year.  He has stress test schedule for tomorrow.  Echo pending.  No CP.   ? ?PMH and SH reviewed ? ?Meds, vitals, and allergies reviewed.  ? ?ROS: Per HPI.  Unless specifically indicated otherwise in HPI, the patient denies: ? ?General: fever. ?Eyes: acute vision changes ?ENT: sore throat ?Cardiovascular: chest pain ?Respiratory: SOB ?GI: vomiting ?GU: dysuria ?Musculoskeletal: acute back pain ?Derm: acute rash ?Neuro: acute motor dysfunction ?Psych: worsening mood ?Endocrine: polydipsia ?Heme: bleeding ?Allergy: hayfever ? ?GEN: nad, alert and oriented ?HEENT: ncat ?NECK: supple w/o LA ?CV: rrr. ?PULM: ctab, no inc wob ?ABD: soft, +bs ?EXT: no edema ?SKIN: well perfused.  ? ?Diabetic foot exam: ?Normal inspection ?No skin breakdown ?No calluses  ?Normal DP pulses ?Normal sensation to light touch and monofilament ?Nails normal except for L 1st nail thickened.   ?

## 2021-12-01 ENCOUNTER — Encounter
Admission: RE | Admit: 2021-12-01 | Discharge: 2021-12-01 | Disposition: A | Discharge status: Home / Self Care | Payer: Medicare Other | Source: Ambulatory Visit | Attending: Medical | Admitting: Medical

## 2021-12-01 ENCOUNTER — Other Ambulatory Visit: Payer: Self-pay | Admitting: Medical

## 2021-12-01 DIAGNOSIS — E785 Hyperlipidemia, unspecified: Secondary | ICD-10-CM | POA: Insufficient documentation

## 2021-12-01 DIAGNOSIS — R0609 Other forms of dyspnea: Secondary | ICD-10-CM

## 2021-12-01 DIAGNOSIS — R030 Elevated blood-pressure reading, without diagnosis of hypertension: Secondary | ICD-10-CM | POA: Insufficient documentation

## 2021-12-01 DIAGNOSIS — I2510 Atherosclerotic heart disease of native coronary artery without angina pectoris: Secondary | ICD-10-CM

## 2021-12-01 NOTE — Telephone Encounter (Signed)
Spoke with patient and reviewed recommendations to not start carvedilol and confirmed they rescheduled his stress test. He was appreciative for the call and review with no further questions at this time. ?

## 2021-12-01 NOTE — Telephone Encounter (Signed)
Left voicemail message to call back for review of some changes on both home and mobile number.  ? ?Your physician has recommended you make the following change in your medication:  ?  ?Carvedilol discontinued per provider. Pharmacy has been called and prescription had not been picked up. They returned to stock and removed from patients profile.  ?

## 2021-12-01 NOTE — Telephone Encounter (Signed)
Patient returning call.

## 2021-12-03 ENCOUNTER — Encounter
Admission: RE | Admit: 2021-12-03 | Discharge: 2021-12-03 | Disposition: A | Discharge status: Home / Self Care | Payer: Medicare Other | Source: Ambulatory Visit | Attending: Medical | Admitting: Medical

## 2021-12-03 DIAGNOSIS — I2510 Atherosclerotic heart disease of native coronary artery without angina pectoris: Secondary | ICD-10-CM | POA: Diagnosis not present

## 2021-12-03 DIAGNOSIS — E785 Hyperlipidemia, unspecified: Secondary | ICD-10-CM

## 2021-12-03 DIAGNOSIS — R030 Elevated blood-pressure reading, without diagnosis of hypertension: Secondary | ICD-10-CM

## 2021-12-03 DIAGNOSIS — R0609 Other forms of dyspnea: Secondary | ICD-10-CM

## 2021-12-03 LAB — NM MYOCAR MULTI W/SPECT W/WALL MOTION / EF
Estimated workload: 7
Exercise duration (min): 7 min
Exercise duration (sec): 19 s
LV dias vol: 124 mL (ref 62–150)
LV sys vol: 42 mL
MPHR: 149 {beats}/min
Nuc Stress EF: 66 %
Peak HR: 127 {beats}/min
Percent HR: 85 %
Rest HR: 57 {beats}/min
Rest Nuclear Isotope Dose: 10.4 mCi
SDS: 1
SRS: 4
SSS: 7
Stress Nuclear Isotope Dose: 31.3 mCi
TID: 0.9

## 2021-12-03 MED ORDER — TECHNETIUM TC 99M TETROFOSMIN IV KIT
30.0000 | PACK | Freq: Once | INTRAVENOUS | Status: AC
  Administered 2021-12-03: 31.31 via INTRAVENOUS

## 2021-12-03 MED ORDER — TECHNETIUM TC 99M TETROFOSMIN IV KIT
10.0000 | PACK | Freq: Once | INTRAVENOUS | Status: AC
  Administered 2021-12-03: 10.38 via INTRAVENOUS

## 2021-12-04 NOTE — Assessment & Plan Note (Signed)
DOE, ie fatigue with exertion.  Noted in the last year.  He has stress test schedule for tomorrow.  Echo pending.  No CP.  I'll await cards f/u.  He agrees.   ?

## 2021-12-04 NOTE — Assessment & Plan Note (Signed)
Flu 2023 ?Shingles d/w pt.  He had zostavax prev.  ?PNA UTD ?Tetanus 2019 ?Covid prev done.  ?Colonoscopy 2013 ?Prostate cancer screening 2023 ?Advance directive- wife designated if patient were incapacitated.   ?Cognitive function addressed- see scanned forms- and if abnormal then additional documentation follows.  ?

## 2021-12-04 NOTE — Assessment & Plan Note (Signed)
A1c above goal at 8.6.  we agreed to address after stress test.  He is off farxiga.  He had urinary urgency with '5mg'$  a day.  Continue insulin and metformin.   ?

## 2021-12-04 NOTE — Assessment & Plan Note (Signed)
Advance directive- wife designated if patient were incapacitated.  

## 2021-12-07 ENCOUNTER — Telehealth: Payer: Self-pay

## 2021-12-07 NOTE — Telephone Encounter (Signed)
Called the patient to give stress test results. Lmtcb. ?

## 2021-12-07 NOTE — Telephone Encounter (Signed)
-----   Message from [employer], PA-C sent at 12/04/2021  4:29 PM EDT ----- ?Stress test showed no significant blockages, pump function normal at 55%, overall low to moderate risk. Continue current medications. Can we ask what his blood pressures have been at home? ?

## 2021-12-07 NOTE — Telephone Encounter (Signed)
Patient made aware of stress test results with verbalized understanding.  ?Patients that he has been monitor ing his BP and his readings have been "normal" ?130's/70's. ? ? ?

## 2021-12-10 ENCOUNTER — Ambulatory Visit (INDEPENDENT_AMBULATORY_CARE_PROVIDER_SITE_OTHER): Payer: Medicare Other

## 2021-12-10 DIAGNOSIS — R0609 Other forms of dyspnea: Secondary | ICD-10-CM | POA: Diagnosis not present

## 2021-12-10 DIAGNOSIS — I2510 Atherosclerotic heart disease of native coronary artery without angina pectoris: Secondary | ICD-10-CM | POA: Diagnosis not present

## 2021-12-10 LAB — ECHOCARDIOGRAM COMPLETE
AR max vel: 3.56 cm2
AV Area VTI: 3.7 cm2
AV Area mean vel: 3.92 cm2
AV Mean grad: 3 mmHg
AV Peak grad: 6.6 mmHg
Ao pk vel: 1.28 m/s
Area-P 1/2: 3.08 cm2
Calc EF: 55.5 %
S' Lateral: 3.3 cm
Single Plane A2C EF: 56.4 %
Single Plane A4C EF: 53.6 %

## 2021-12-14 ENCOUNTER — Other Ambulatory Visit: Payer: Self-pay | Admitting: Family Medicine

## 2021-12-25 ENCOUNTER — Ambulatory Visit: Payer: Medicare Other | Admitting: Medical

## 2021-12-30 ENCOUNTER — Ambulatory Visit: Payer: Medicare Other | Admitting: Internal Medicine

## 2021-12-30 ENCOUNTER — Encounter: Payer: Self-pay | Admitting: Internal Medicine

## 2021-12-30 VITALS — BP 120/70 | HR 64 | Ht 71.0 in | Wt 201.0 lb

## 2021-12-30 DIAGNOSIS — I5189 Other ill-defined heart diseases: Secondary | ICD-10-CM

## 2021-12-30 DIAGNOSIS — R0609 Other forms of dyspnea: Secondary | ICD-10-CM | POA: Diagnosis not present

## 2021-12-30 DIAGNOSIS — I2510 Atherosclerotic heart disease of native coronary artery without angina pectoris: Secondary | ICD-10-CM

## 2021-12-30 DIAGNOSIS — E1169 Type 2 diabetes mellitus with other specified complication: Secondary | ICD-10-CM | POA: Diagnosis not present

## 2021-12-30 DIAGNOSIS — E785 Hyperlipidemia, unspecified: Secondary | ICD-10-CM

## 2021-12-30 NOTE — Patient Instructions (Signed)
Medication Instructions:  ? ?Your physician recommends that you continue on your current medications as directed. Please refer to the Current Medication list given to you today. ? ?*If you need a refill on your cardiac medications before your next appointment, please call your pharmacy* ? ? ?Lab Work: ? ?None ordered ? ?Testing/Procedures: ? ?None ordered ? ? ?Follow-Up: ?At [facility], you and your health needs are our priority.  As part of our continuing mission to provide you with exceptional heart care, we have created designated Provider Care Teams.  These Care Teams include your primary Cardiologist (physician) and Advanced Practice Providers (APPs -  Physician Assistants and Nurse Practitioners) who all work together to provide you with the care you need, when you need it. ? ?We recommend signing up for the patient portal called "MyChart".  Sign up information is provided on this After Visit Summary.  MyChart is used to connect with patients for Virtual Visits (Telemedicine).  Patients are able to view lab/test results, encounter notes, upcoming appointments, etc.  Non-urgent messages can be sent to your provider as well.   ?To learn more about what you can do with MyChart, go to [email or web address].   ? ?Your next appointment:   ?1 year(s) ? ?The format for your next appointment:   ?In Person ? ?Provider:   ?You may see Christopher End, MD or one of the following Advanced Practice Providers on your designated Care Team:   ?Christopher Berge, NP ?Ryan Dunn, PA-C ?Cadence Furth, PA-C ? ?Important Information About Sugar ? ? ? ? ? ? ?

## 2021-12-30 NOTE — Progress Notes (Signed)
? ?Follow-up Outpatient Visit ?Date: 12/30/2021 ? ?Primary Care Provider: ?Tonia Ghent, MD ?[address] ?[place] [address] ? ?Chief Complaint: Follow-up coronary artery disease ? ?HPI:  Carl Barnes is a 71 y.o. male with history of nonobstructive coronary artery disease, hyperlipidemia, type 2 diabetes mellitus, and tobacco use, who presents for follow-up of coronary artery disease.  He was last seen in our office a month ago by [employer], [place], following a hospitalization for scrotal abscess in March.  He complained of progressive fatigue over the last year with associated exertional dyspnea.  Subsequent MPI showed fixed inferior defect but no ischemia.  Echo demonstrated LVEF 60-65% with grade 2 diastolic dysfunction and mild mitral regurgitation. ? ?Today, Carl Barnes reports he is feeling a little better than at his last visit.  He attributes this to being more active.  He does not exercise deliberately but remains very active around the house.  He notes some exertional dyspnea with very strenuous activities but is not particularly limited by most tasks.  He has not had any chest pain, palpitations, lightheadedness, or edema.  He and his wife note that they have some dietary indiscretion with notably suboptimal hemoglobin A1c on last check in March (8.6%). ? ?-------------------------------------------------------------------------------------------------- ? ?Past Medical History:  ?Diagnosis Date  ? Cholangitis 12/11  ? [facility] admission - ERCP for stone removal per GI  ? Diabetes mellitus without complication ([place])   ? Elevated glucose 10/16 to 06/09/2004  ? [place] admission, mild elevated cholesterol  ? History of echocardiogram   ? a. 05/2017 Echo: EF 55-60%, mild LVH, no rwma, mild MR.  ? Hyperlipidemia 12/21/1997  ? Morbid obesity ([place])   ? Non-obstructive CAD (coronary artery disease)   ? a. 05/2016 Abnl St Test: intermediate risk, large, partially reversible inf/infsept defect consistent w/  ischemia/? scar;  b. 06/2016 Cath: LM nl, LAD 103m RI nl, LCX nl, OM1/2/3 nl, RCA 40p/m, 30d, RPDA/RPAV nl.  ? Poisoning by black widow spider bite 1979  ? RBBB   ? Renal stones   ? Tobacco abuse   ? ?Past Surgical History:  ?Procedure Laterality Date  ? CARDIAC CATHETERIZATION Left 07/06/2016  ? Procedure: Left Heart Cath and Coronary Angiography;  Surgeon: CNelva Bush MD;  [facility];  Service: Cardiovascular;  Laterality: Left;  ? Cardiolite  06/08/2004  ? WNL  ? CATARACT EXTRACTION W/ INTRAOCULAR LENS IMPLANT Right 10/13/2017  ? Dr. TSatira Sark ? CATARACT EXTRACTION W/ INTRAOCULAR LENS IMPLANT Left 09/08/2017  ? Dr.Tanner  ? Chest CT w/contrast  06/08/04  ? Negative PE  ? CHOLECYSTECTOMY  01/2011  ? Diseased GB.  [facility]  ? Head MRI  12/99  ? Normal (R/O acoustic neuroma)  ? ? ?Current Meds  ?Medication Sig  ? ACCU-CHEK AVIVA PLUS test strip CHECK SUGAR DAILY UP TO 3 TIMES DAILY. INSULIN TREATED DM2  ? acetaminophen (TYLENOL) 500 MG tablet Take 1,000 mg by mouth in the morning and at bedtime.  ? aspirin EC 81 MG tablet Take 1 tablet (81 mg total) by mouth daily.  ? B-D UF III MINI PEN NEEDLES 31G X 5 MM MISC USE DAILY WITH INSULIN PEN  ? betamethasone dipropionate 0.05 % cream Apply 1 application. topically daily as needed (for psoriasis- apply to affected areas).  ? cetirizine (ZYRTEC) 10 MG tablet Take 10 mg by mouth daily as needed (for seasonal allergies with lawn mowing).   ? gabapentin (NEURONTIN) 600 MG tablet Take 600 mg by mouth in  the morning and at bedtime.  ? LANTUS SOLOSTAR 100 UNIT/ML Solostar Pen INJECT 32 UNITS UNDER THE SKIN ONCE DAILY  ? metFORMIN (GLUCOPHAGE) 500 MG tablet Take 1 tablet (500 mg total) by mouth at bedtime.  ? pravastatin (PRAVACHOL) 40 MG tablet TAKE 1 TABLET BY MOUTH EVERY NIGHT AT BEDTIME  ? ? ?Allergies: Chantix [varenicline], Codeine, and Metformin and related ? ?Social History  ? ?Tobacco Use  ? Smoking status: Every Day  ?  Packs/day: 0.25  ?  Years:  35.00  ?  Pack years: 8.75  ?  Types: Cigarettes  ? Smokeless tobacco: Never  ?Vaping Use  ? Vaping Use: Former  ?Substance Use Topics  ? Alcohol use: Yes  ?  Alcohol/week: 2.0 standard drinks  ?  Types: 2 Shots of liquor per week  ?  Comment: socially  ? Drug use: No  ? ? ?Family History  ?Problem Relation Age of Onset  ? Cancer Mother   ?     Pancreatic  ? Hyperlipidemia Mother   ? CVA Father   ? Stroke Brother   ? Heart disease Brother   ? Heart disease Paternal Grandfather   ?     MI  ? Diabetes Neg Hx   ? Alcohol abuse Neg Hx   ? Drug abuse Neg Hx   ? Depression Neg Hx   ? Colon cancer Neg Hx   ? Prostate cancer Neg Hx   ? ? ?Review of Systems: ?A 12-system review of systems was performed and was negative except as noted in the HPI. ? ?-------------------------------------------------------------------------------------------------- ? ?Physical Exam: ?BP 120/70 (BP Location: Left Arm, Patient Position: Sitting, Cuff Size: Large)   Pulse 64   Ht '5\' 11"'$  (1.803 m)   Wt 201 lb (91.2 kg)   SpO2 98%   BMI 28.03 kg/m?  ? ?General:  NAD. ?Neck: No JVD or HJR. ?Lungs: Clear to auscultation bilaterally without wheezes or crackles. ?Heart: Regular rate and rhythm without murmurs, rubs, or gallops. ?Abdomen: Soft, nontender, nondistended. ?Extremities: No lower extremity edema. ? ? ?Lab Results  ?Component Value Date  ? WBC 9.7 10/31/2021  ? HGB 13.5 10/31/2021  ? HCT 40.7 10/31/2021  ? MCV 98.1 10/31/2021  ? PLT 158 10/31/2021  ? ? ?Lab Results  ?Component Value Date  ? NA 138 11/25/2021  ? K 4.6 11/25/2021  ? CL 104 11/25/2021  ? CO2 27 11/25/2021  ? BUN 14 11/25/2021  ? CREATININE 0.88 11/25/2021  ? GLUCOSE 220 (H) 11/25/2021  ? ALT 21 11/25/2021  ? ? ?Lab Results  ?Component Value Date  ? CHOL 110 11/25/2021  ? HDL 31.10 (L) 11/25/2021  ? LDLCALC 45 11/25/2021  ? LDLDIRECT 60.0 10/06/2020  ? TRIG 174.0 (H) 11/25/2021  ? CHOLHDL 4 11/25/2021   ? ? ?-------------------------------------------------------------------------------------------------- ? ?ASSESSMENT AND PLAN: ?Nonobstructive coronary artery disease: ?Carl Barnes does not have any angina.  Recent myocardial perfusion stress test showed possible inferior scar without ischemia.  Given that he previously had an inferior defect on MPI with subsequent catheterization showing nonobstructive CAD, I suspect this represents artifact.  We will continue medical therapy, including aspirin and pravastatin, to prevent progression of disease. ? ?Dyspnea on exertion and diastolic dysfunction: ?Dyspnea on exertion is likely multifactorial, though an element of diastolic dysfunction may be playing a role.  As Carl Barnes continues to smoke, smoking-related lung disease could also be contributing.  We discussed the importance of glycemic, lipid, and blood pressure control to help slow progression of diastolic  dysfunction.  I also encouraged Carl Barnes to quit smoking. ? ?Hyperlipidemia associated with type 2 diabetes mellitus: ?Lipid panel last month showed excellent LDL of 45.  Triglycerides were mildly elevated and HDL slightly below goal.  We will continue pravastatin 40 mg daily.  I have encouraged Carl Barnes to work on lifestyle modifications to help optimize his lipid profile.  Ongoing management of uncontrolled type 2 diabetes mellitus per Dr. Damita Dunnings. ? ?Follow-up: Return to clinic in 1 year. ? ?Nelva Bush, MD ?12/30/2021 ?9:34 AM ? ?

## 2022-01-06 ENCOUNTER — Telehealth: Payer: Self-pay | Admitting: Family Medicine

## 2022-01-06 NOTE — Telephone Encounter (Signed)
Please check with patient.  I realize he has follow-up scheduled for July.  I saw his recent cardiology note.  Please let me know how his sugar is running in the meantime so we can make some adjustments if needed between now and July.  Thanks. ?

## 2022-01-08 NOTE — Telephone Encounter (Signed)
LMTCB

## 2022-01-08 NOTE — Telephone Encounter (Signed)
Patient states his sugars have been okay. States they have been 130-140 every am; states its in the evening its about the same.

## 2022-01-10 NOTE — Telephone Encounter (Signed)
Noted.  I would continue as is and keep the f/u as scheduled. Thanks.

## 2022-03-01 ENCOUNTER — Encounter: Payer: Self-pay | Admitting: Family Medicine

## 2022-03-01 ENCOUNTER — Ambulatory Visit: Payer: Medicare Other | Admitting: Family Medicine

## 2022-03-01 VITALS — BP 122/70 | HR 61 | Temp 97.8°F | Ht 71.0 in | Wt 207.0 lb

## 2022-03-01 DIAGNOSIS — E119 Type 2 diabetes mellitus without complications: Secondary | ICD-10-CM

## 2022-03-01 LAB — POCT GLYCOSYLATED HEMOGLOBIN (HGB A1C): Hemoglobin A1C: 8.2 % — AB (ref 4.0–5.6)

## 2022-03-01 NOTE — Progress Notes (Unsigned)
Diabetes:  Using medications without difficulties: '500mg'$  metformin and 32 units insulin.   Hypoglycemic episodes: rarely with any sx and he is aware of that and cautious.  Only noted with prolonged fasting.   Hyperglycemic episodes: no Feet problems: no Blood Sugars averaging: 120-150 usually in the AM eye exam within last year: yes A1c 8.2, improved from 9.4--->8.6----->8.2 today.   Diet and exercise d/w pt.    Meds, vitals, and allergies reviewed.  ROS: Per HPI unless specifically indicated in ROS section   GEN: nad, alert and oriented HEENT: ncat NECK: supple w/o LA CV: rrr. PULM: ctab, no inc wob ABD: soft, +bs EXT: no edema SKIN: well perfused.

## 2022-03-01 NOTE — Patient Instructions (Signed)
Plan on recheck in about 4 months.   Update me as needed.  Take care.  Glad to see you.

## 2022-03-03 NOTE — Assessment & Plan Note (Signed)
A1c improved.  Continue metformin and insulin.  Continue work on diet and exercise.  Recheck periodically.  I thanked him for his effort.

## 2022-04-05 ENCOUNTER — Ambulatory Visit: Payer: Medicare Other | Admitting: Nurse Practitioner

## 2022-04-05 VITALS — BP 120/62 | HR 61 | Temp 98.0°F | Resp 14 | Ht 71.0 in | Wt 204.5 lb

## 2022-04-05 DIAGNOSIS — L0291 Cutaneous abscess, unspecified: Secondary | ICD-10-CM

## 2022-04-05 LAB — CBC
HCT: 44.6 % (ref 39.0–52.0)
Hemoglobin: 15.2 g/dL (ref 13.0–17.0)
MCHC: 34.1 g/dL (ref 30.0–36.0)
MCV: 95.7 fl (ref 78.0–100.0)
Platelets: 141 10*3/uL — ABNORMAL LOW (ref 150.0–400.0)
RBC: 4.66 Mil/uL (ref 4.22–5.81)
RDW: 13.1 % (ref 11.5–15.5)
WBC: 7.2 10*3/uL (ref 4.0–10.5)

## 2022-04-05 NOTE — Assessment & Plan Note (Signed)
Abscess to the perirectum.  Patient had some leftover Bactrim DS that he started taking including today's dose patient has 7 days left knee started taking it a few days ago so total of 10 days of antibiotic.  Did get wound culture today waiting for result and sensitivity.  Continue taking Bactrim.  Did discuss signs and symptoms when to seek urgent emergent healthcare

## 2022-04-05 NOTE — Progress Notes (Signed)
   Acute Office Visit  Subjective:     Patient ID: Carl Barnes, male    DOB: 1951-08-07, 71 y.o.   MRN: [id number]  Chief Complaint  Patient presents with   abscess in the rectum    Came up 2 weeks ago, pain present. Thinks abscess burst partially or all of it yesterday 04/04/22. Not as much pain today. Had pus and blood come out. Fever was 104.9.    HPI Patient is in today for process  States that it has been a slow progression. States that he tought he had a hemmorhiod that was painful and bleeding. States that the bleeding was through the rectum States that it rupture on it owns yesterday.  States that he had a fever Saturday and usnday. Yesterday was 104 with a temporal. States that he had some left over bactrium that he started taking Saturday.   States he has been taking tylenol with some relief   Review of Systems  Constitutional:  Positive for chills, fever and malaise/fatigue.  Respiratory:  Negative for shortness of breath.   Cardiovascular:  Negative for chest pain.  Gastrointestinal:        BM daily several times a day  Skin:        + lesion        Objective:    BP 120/62   Pulse 61   Temp 98 F (36.7 C)   Resp 14   Ht '5\' 11"'$  (1.803 m)   Wt 204 lb 8 oz (92.8 kg)   SpO2 96%   BMI 28.52 kg/m    Physical Exam Vitals and nursing note reviewed.  Constitutional:      Appearance: Normal appearance. He is obese.  Cardiovascular:     Rate and Rhythm: Normal rate and regular rhythm.  Pulmonary:     Breath sounds: Normal breath sounds.  Genitourinary:      Comments: Red line as area of induration. Skin:    Findings: Erythema and lesion present.  Neurological:     Mental Status: He is alert.     Results for orders placed or performed in visit on 04/05/22  CBC  Result Value Ref Range   WBC 7.2 4.0 - 10.5 K/uL   RBC 4.66 4.22 - 5.81 Mil/uL   Platelets 141.0 (L) 150.0 - 400.0 K/uL   Hemoglobin 15.2 13.0 - 17.0 g/dL   HCT 44.6 39.0 - 52.0 %    MCV 95.7 78.0 - 100.0 fl   MCHC 34.1 30.0 - 36.0 g/dL   RDW 13.1 11.5 - 15.5 %        Assessment & Plan:   Problem List Items Addressed This Visit       Other   Abscess - Primary    Abscess to the perirectum.  Patient had some leftover Bactrim DS that he started taking including today's dose patient has 7 days left knee started taking it a few days ago so total of 10 days of antibiotic.  Did get wound culture today waiting for result and sensitivity.  Continue taking Bactrim.  Did discuss signs and symptoms when to seek urgent emergent healthcare      Relevant Orders   CBC (Completed)   WOUND CULTURE    No orders of the defined types were placed in this encounter.   Return in about 4 days (around 04/09/2022) for Abscess on rectum recheck.Romilda Garret, NP

## 2022-04-05 NOTE — Patient Instructions (Signed)
Nice to see you today Continue taking the antibiotic that you have in office Follow up in the next 3-4 days in clinic for a recheck. Dr. Damita Dunnings or myself is fine  Can use warm compresses and a stiz bath on the area. It may drain more and that is ok. Continue taking the tylenol like you have been

## 2022-04-08 LAB — WOUND CULTURE
MICRO NUMBER:: 13775207
SPECIMEN QUALITY:: ADEQUATE

## 2022-04-09 ENCOUNTER — Ambulatory Visit: Payer: Medicare Other | Admitting: Nurse Practitioner

## 2022-04-09 VITALS — BP 108/62 | HR 62 | Temp 98.0°F | Resp 12 | Ht 71.0 in | Wt 204.0 lb

## 2022-04-09 DIAGNOSIS — L0291 Cutaneous abscess, unspecified: Secondary | ICD-10-CM | POA: Diagnosis not present

## 2022-04-09 NOTE — Patient Instructions (Signed)
Looks and feels much better Continue to finish out the antibiotics.  Follow up if you need [place] or it recours

## 2022-04-09 NOTE — Assessment & Plan Note (Signed)
Follow-up for abscess.  Patient still has a couple days of the Bactrim medication left.  Encourage patient to finish entire course.  Had clinical improvement in office.  Follow-up as needed

## 2022-04-09 NOTE — Progress Notes (Signed)
   Established Patient Office Visit  Subjective   Patient ID: Carl Barnes, male    DOB: 1950-09-02  Age: 71 y.o. MRN: [id number]  Chief Complaint  Patient presents with   abscess follow up    Better, still has a little bit of soreness in the rectum area, still has some drainage mostly clear with a tent of blood but not much. No fever. Starting to have some itching present.    HPI  Abscess: States that he has been taking the antibiotic as prescirbed. States that he is getting better overall.  He has approximately 2 to 3 days of the antibiotic left. No fever or chills.   Still some tenderness and some itching.  Patient states there is discharge but per his description sounds like serosanguineous drainage    Review of Systems  Constitutional:  Negative for chills and fever.  Gastrointestinal:  Negative for abdominal pain, diarrhea, nausea and vomiting.      Objective:     BP 108/62   Pulse 62   Temp 98 F (36.7 C)   Resp 12   Ht '5\' 11"'$  (1.803 m)   Wt 204 lb (92.5 kg)   SpO2 96%   BMI 28.45 kg/m    Physical Exam Vitals and nursing note reviewed. Exam conducted with a chaperone present Lonell Grandchild CMA).  Constitutional:      Appearance: Normal appearance. He is obese.  Cardiovascular:     Rate and Rhythm: Normal rate and regular rhythm.     Heart sounds: Normal heart sounds.  Pulmonary:     Effort: Pulmonary effort is normal.     Breath sounds: Normal breath sounds.  Genitourinary:   Neurological:     Mental Status: He is alert.      No results found for any visits on 04/09/22.    The ASCVD Risk score (Arnett DK, et al., 2019) failed to calculate for the following reasons:   The valid total cholesterol range is 130 to 320 mg/dL    Assessment & Plan:   Problem List Items Addressed This Visit       Other   Abscess - Primary    Follow-up for abscess.  Patient still has a couple days of the Bactrim medication left.  Encourage patient to finish  entire course.  Had clinical improvement in office.  Follow-up as needed       Return if symptoms worsen or fail to improve.    Romilda Garret, NP

## 2022-06-11 ENCOUNTER — Encounter: Payer: Self-pay | Admitting: Internal Medicine

## 2022-06-15 ENCOUNTER — Other Ambulatory Visit: Payer: Self-pay | Admitting: Family Medicine

## 2022-07-02 ENCOUNTER — Ambulatory Visit: Payer: Medicare Other | Admitting: Family Medicine

## 2022-07-02 ENCOUNTER — Encounter: Payer: Self-pay | Admitting: Family Medicine

## 2022-07-02 VITALS — BP 124/68 | HR 62 | Temp 97.3°F | Ht 71.0 in | Wt 208.0 lb

## 2022-07-02 DIAGNOSIS — R413 Other amnesia: Secondary | ICD-10-CM | POA: Diagnosis not present

## 2022-07-02 DIAGNOSIS — R0683 Snoring: Secondary | ICD-10-CM

## 2022-07-02 DIAGNOSIS — E119 Type 2 diabetes mellitus without complications: Secondary | ICD-10-CM

## 2022-07-02 DIAGNOSIS — Z794 Long term (current) use of insulin: Secondary | ICD-10-CM | POA: Diagnosis not present

## 2022-07-02 LAB — HEMOGLOBIN A1C: Hgb A1c MFr Bld: 8.3 % — ABNORMAL HIGH (ref 4.6–6.5)

## 2022-07-02 LAB — COMPREHENSIVE METABOLIC PANEL
ALT: 17 U/L (ref 0–53)
AST: 14 U/L (ref 0–37)
Albumin: 4.1 g/dL (ref 3.5–5.2)
Alkaline Phosphatase: 53 U/L (ref 39–117)
BUN: 15 mg/dL (ref 6–23)
CO2: 29 mEq/L (ref 19–32)
Calcium: 9.3 mg/dL (ref 8.4–10.5)
Chloride: 106 mEq/L (ref 96–112)
Creatinine, Ser: 0.85 mg/dL (ref 0.40–1.50)
GFR: 86.97 mL/min (ref >60.00)
Glucose, Bld: 176 mg/dL — ABNORMAL HIGH (ref 70–99)
Potassium: 4.9 mEq/L (ref 3.5–5.1)
Sodium: 138 mEq/L (ref 135–145)
Total Bilirubin: 0.4 mg/dL (ref 0.2–1.2)
Total Protein: 6.4 g/dL (ref 6.0–8.3)

## 2022-07-02 LAB — CBC WITH DIFFERENTIAL/PLATELET
Basophils Absolute: 0.1 10*3/uL (ref 0.0–0.1)
Basophils Relative: 1 % (ref 0.0–3.0)
Eosinophils Absolute: 0.3 10*3/uL (ref 0.0–0.7)
Eosinophils Relative: 5.1 % — ABNORMAL HIGH (ref 0.0–5.0)
HCT: 46.6 % (ref 39.0–52.0)
Hemoglobin: 15.5 g/dL (ref 13.0–17.0)
Lymphocytes Relative: 19.5 % (ref 12.0–46.0)
Lymphs Abs: 1.3 10*3/uL (ref 0.7–4.0)
MCHC: 33.3 g/dL (ref 30.0–36.0)
MCV: 96.1 fl (ref 78.0–100.0)
Monocytes Absolute: 0.5 10*3/uL (ref 0.1–1.0)
Monocytes Relative: 6.9 % (ref 3.0–12.0)
Neutro Abs: 4.5 10*3/uL (ref 1.4–7.7)
Neutrophils Relative %: 67.5 % (ref 43.0–77.0)
Platelets: 140 10*3/uL — ABNORMAL LOW (ref 150.0–400.0)
RBC: 4.85 Mil/uL (ref 4.22–5.81)
RDW: 13 % (ref 11.5–15.5)
WBC: 6.7 10*3/uL (ref 4.0–10.5)

## 2022-07-02 LAB — VITAMIN B12: Vitamin B-12: 185 pg/mL — ABNORMAL LOW (ref 211–911)

## 2022-07-02 LAB — TSH: TSH: 2.2 u[IU]/mL (ref 0.35–5.50)

## 2022-07-02 NOTE — Patient Instructions (Addendum)
Go to the lab on the way out.   If you have mychart we'll likely use that to update you.    Don't change your meds.  We'll see about when to get together when I see your labs.   We'll call about seeing pulmonary for the sleep test.   Take care.  Glad to see you.

## 2022-07-02 NOTE — Progress Notes (Unsigned)
Here today with his wife.  He didn't recall details about abscess/treatment in August.  Wife note short term memory changes.  D/w pt about checking routine labs today.    MMSE 29/30, -1 for date.    Some sleep disruption from arthritis.  Wife noted breathing changes at night, ie possible OSA.    Diabetes:  Using medications without difficulties: yes Hypoglycemic episodes: no Hyperglycemic episodes: no  Feet problems: no Blood Sugars averaging: ~150 eye exam within last year: yes Labs pending.    Meds, vitals, and allergies reviewed.   ROS: Per HPI unless specifically indicated in ROS section   GEN: nad, alert and oriented HEENT: ncat NECK: supple w/o LA CV: rrr.  PULM: ctab, no inc wob ABD: soft, +bs EXT: no edema SKIN: well perfused.

## 2022-07-04 ENCOUNTER — Other Ambulatory Visit: Payer: Self-pay | Admitting: Family Medicine

## 2022-07-04 ENCOUNTER — Encounter: Payer: Self-pay | Admitting: Family Medicine

## 2022-07-04 DIAGNOSIS — R0683 Snoring: Secondary | ICD-10-CM | POA: Insufficient documentation

## 2022-07-04 DIAGNOSIS — E538 Deficiency of other specified B group vitamins: Secondary | ICD-10-CM | POA: Insufficient documentation

## 2022-07-04 DIAGNOSIS — R413 Other amnesia: Secondary | ICD-10-CM | POA: Insufficient documentation

## 2022-07-04 DIAGNOSIS — E119 Type 2 diabetes mellitus without complications: Secondary | ICD-10-CM

## 2022-07-04 MED ORDER — CYANOCOBALAMIN 1000 MCG/ML IJ SOLN: INTRAMUSCULAR | 0 refills | Status: DC

## 2022-07-04 NOTE — Assessment & Plan Note (Signed)
Refer to pulmonary for sleep apnea testing as this could potentially contribute to memory changes via sleep deprivation.  Rationale discussed with patient.

## 2022-07-04 NOTE — Assessment & Plan Note (Signed)
Check basic labs today to look for reversible causes.  Defer head CT at this point.  Refer to pulmonary for sleep apnea testing.  At this point still okay for outpatient follow-up.  We talked about options regarding his memory and we can monitor this as we go along.  If he has red flag changes that he can update me.

## 2022-07-04 NOTE — Assessment & Plan Note (Signed)
See notes on labs.  Continue metformin and Lantus.  Continue work on diet and exercise.

## 2022-07-07 ENCOUNTER — Other Ambulatory Visit: Payer: Self-pay | Admitting: Family Medicine

## 2022-07-07 MED ORDER — CYANOCOBALAMIN 1000 MCG/ML IJ SOLN: INTRAMUSCULAR | 2 refills | Status: DC

## 2022-07-07 MED ORDER — "SYRINGE/NEEDLE (DISP) 25G X 1"" 3 ML MISC": 1 refills | Status: AC

## 2022-08-06 ENCOUNTER — Ambulatory Visit: Payer: Medicare Other | Admitting: Internal Medicine

## 2022-08-06 ENCOUNTER — Encounter: Payer: Self-pay | Admitting: Internal Medicine

## 2022-08-06 VITALS — BP 142/78 | HR 85 | Temp 98.2°F | Ht 71.0 in | Wt 208.4 lb

## 2022-08-06 DIAGNOSIS — L02413 Cutaneous abscess of right upper limb: Secondary | ICD-10-CM

## 2022-08-06 MED ORDER — AMOXICILLIN 500 MG PO TABS: 1000.0000 mg | ORAL_TABLET | Freq: Two times a day (BID) | ORAL | 1 refills | Status: DC

## 2022-08-06 NOTE — Assessment & Plan Note (Signed)
Has had recurrent Strep agalactica Will give amoxil now Warm compresses ID evaluation--unclear where the source of this is

## 2022-08-06 NOTE — Progress Notes (Signed)
Subjective:    Patient ID: Carl Barnes, male    DOB: 01-28-1951, 71 y.o.   MRN: [id number]  HPI Here with concerns about an abscess on his arm With wife  Has had symptoms for about 2 weeks 3rd but with this--one in groin, one on buttock  First time he needed ER and I&D They swell and then usually open up on their own  Was bigger a few days ago Tender No fever Tried heat on it--and they tried Vick''s  Current Outpatient Medications on File Prior to Visit  Medication Sig Dispense Refill   ACCU-CHEK AVIVA PLUS test strip CHECK SUGAR DAILY UP TO 3 TIMES DAILY. INSULIN TREATED DM2 100 each 12   acetaminophen (TYLENOL) 500 MG tablet Take 1,000 mg by mouth in the morning and at bedtime.     aspirin EC 81 MG tablet Take 1 tablet (81 mg total) by mouth daily. 90 tablet 3   B-D UF III MINI PEN NEEDLES 31G X 5 MM MISC USE DAILY WITH INSULIN PEN 100 each 3   betamethasone dipropionate 0.05 % cream Apply 1 application. topically daily as needed (for psoriasis- apply to affected areas).     cetirizine (ZYRTEC) 10 MG tablet Take 10 mg by mouth daily as needed (for seasonal allergies with lawn mowing).      cyanocobalamin (VITAMIN B12) 1000 MCG/ML injection 1000 mcg injected IM weekly x4 weeks then monthly thereafter. 4 mL 2   gabapentin (NEURONTIN) 600 MG tablet Take 600 mg by mouth in the morning and at bedtime.     LANTUS SOLOSTAR 100 UNIT/ML Solostar Pen INJECT 32 UNITS UNDER THE SKIN ONCE DAILY 15 mL 3   metFORMIN (GLUCOPHAGE) 500 MG tablet Take 1 tablet (500 mg total) by mouth at bedtime. 90 tablet 3   pravastatin (PRAVACHOL) 40 MG tablet TAKE 1 TABLET BY MOUTH EVERY NIGHT AT BEDTIME 90 tablet 3   SYRINGE-NEEDLE, DISP, 3 ML 25G X 1" 3 ML MISC Use with B12 injection 10 each 1   No current facility-administered medications on file prior to visit.    Allergies  Allergen Reactions   Chantix [Varenicline] Other (See Comments)    Intolerant    Codeine Other (See Comments)    GI upset    Metformin And Related Other (See Comments)    Max dose '500mg'$ -'1000mg'$  a day    Past Medical History:  Diagnosis Date   Cholangitis 12/11   [facility] admission - ERCP for stone removal per GI   Diabetes mellitus without complication ([place])    Elevated glucose 10/16 to 06/09/2004   [facility] admission, mild elevated cholesterol   History of echocardiogram    a. 05/2017 Echo: EF 55-60%, mild LVH, no rwma, mild MR.   Hyperlipidemia 12/21/1997   Morbid obesity ([place])    Non-obstructive CAD (coronary artery disease)    a. 05/2016 Abnl St Test: intermediate risk, large, partially reversible inf/infsept defect consistent w/ ischemia/? scar;  b. 06/2016 Cath: LM nl, LAD 66m RI nl, LCX nl, OM1/2/3 nl, RCA 40p/m, 30d, RPDA/RPAV nl.   Poisoning by black widow spider bite 1979   RBBB    Renal stones    Tobacco abuse     Past Surgical History:  Procedure Laterality Date   CARDIAC CATHETERIZATION Left 07/06/2016   Procedure: Left Heart Cath and Coronary Angiography;  Surgeon: CNelva Bush MD;  [facility];  Service: Cardiovascular;  Laterality: Left;   Cardiolite  06/08/2004   WNL   CATARACT  EXTRACTION W/ INTRAOCULAR LENS IMPLANT Right 10/13/2017   Dr. Satira Sark   CATARACT EXTRACTION W/ INTRAOCULAR LENS IMPLANT Left 09/08/2017   Dr.Tanner   Chest CT w/contrast  06/08/04   Negative PE   CHOLECYSTECTOMY  01/2011   Diseased GB.  [facility]  12/99   Normal (R/O acoustic neuroma)    Family History  Problem Relation Age of Onset   Cancer Mother        Pancreatic   Hyperlipidemia Mother    CVA Father    Stroke Brother    Heart disease Brother    Heart disease Paternal Grandfather        MI   Diabetes Neg Hx    Alcohol abuse Neg Hx    Drug abuse Neg Hx    Depression Neg Hx    Colon cancer Neg Hx    Prostate cancer Neg Hx     Social History   Socioeconomic History   Marital status: Married    Spouse name: Not on file   Number of children: 1   Years of education:  Not on file   Highest education level: Not on file  Occupational History   Occupation: PLANT Buyer, retail: UNEMPLOYED    Comment: Electronics engineer  Tobacco Use   Smoking status: Every Day    Packs/day: 0.25    Years: 35.00    Total pack years: 8.75    Types: Cigarettes   Smokeless tobacco: Never  Vaping Use   Vaping Use: Former  Substance and Sexual Activity   Alcohol use: Yes    Alcohol/week: 2.0 standard drinks of alcohol    Types: 2 Shots of liquor per week    Comment: socially   Drug use: No   Sexual activity: Not on file  Other Topics Concern   Not on file  Social History Narrative   Married, 2nd marriage 1997   1 adult son   Retired Librarian, 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     [employer] fan   Social Determinants of Health   Financial Resource Strain: Not on file  Food Insecurity: Not on file  Transportation Needs: Not on file  Physical Activity: Not on file  Stress: Not on file  Social Connections: Not on file  Intimate Partner Violence: Not on file   Review of Systems     Objective:   Physical Exam Skin:    Comments: Mildly fluctuant mass in upper outer right arm ~ 4 x 6 cm total size Not open Mildly tender            Assessment & Plan:

## 2022-08-17 ENCOUNTER — Other Ambulatory Visit: Payer: Self-pay | Admitting: Internal Medicine

## 2022-08-25 ENCOUNTER — Ambulatory Visit: Payer: Medicare Other | Admitting: Internal Medicine

## 2022-08-25 ENCOUNTER — Other Ambulatory Visit: Payer: Self-pay

## 2022-08-25 ENCOUNTER — Encounter: Payer: Self-pay | Admitting: Internal Medicine

## 2022-08-25 VITALS — BP 138/82 | HR 74 | Temp 97.5°F | Wt 208.0 lb

## 2022-08-25 DIAGNOSIS — L0291 Cutaneous abscess, unspecified: Secondary | ICD-10-CM

## 2022-08-25 NOTE — Progress Notes (Signed)
Patient: Carl Barnes  DOB: [birth year] MRN: [id number] PCP: Tonia Ghent, MD      Patient Active Problem List   Diagnosis Date Noted   Abscess of right arm 08/06/2022   Snoring 07/04/2022   Memory change 07/04/2022   B12 deficiency 07/04/2022   Abscess 04/05/2022   Dyspnea on exertion 38/88/2800   Diastolic dysfunction 34/91/7915   Hyponatremia 11/01/2021   Scrotal abscess 10/30/2021   Diabetes mellitus type 2 in nonobese ([place]) 10/30/2021   Non-recurrent unilateral inguinal hernia without obstruction or gangrene 10/28/2021   Medicare annual wellness visit, subsequent 10/19/2020   Healthcare maintenance 11/04/2019   Dysuria 08/12/2019   Essential hypertension 07/13/2019   Left leg pain 07/13/2019   Lumbar radiculopathy 06/06/2019   Dizziness 10/29/2018   Stye 10/29/2018   Abnormal EKG 07/12/2018   External hemorrhoid 12/17/2016   CAD in native artery 09/14/2016   Abnormal stress test 07/06/2016   Syncope 05/19/2016   Advance care planning 05/19/2016   Diverticulosis 04/14/2012   SMOKER 05/30/2008   RENAL CALCULUS, RECURRENT 05/15/2008   RECTAL BLEEDING 05/10/2008   CARPAL TUNNEL SYNDROME, RIGHT 10/26/2006   Diabetes ([place]) 05/27/2004   Hyperlipidemia associated with type 2 diabetes mellitus ([place]) 12/21/1997   UNSPECIFIED HEARING LOSS 12/21/1997     Subjective:  Carl Barnes is a 72 y.o. male with past medical history of diabetes mellitus, morbid obesity, CAD, renal stones, tobacco abuse referred by primary for recurrent abscesses.  Back in August patient had an abscess in his perirectal area.  He presented to PCP in August and noted that abscess had been there for couple weeks and had burst, wound cultures were obtained.  He also noted he had a fever of 104-day before.  Wound cultures grew group B strep patient did with 10 days of Bactrim.  Seen by Dr. Silvio Pate on 12/15 for abscess on his arm and on his groin and 1 on his buttock.  Noted that he had been on to  the ED for I&D.  He was prescribed amoxicillin 1000 mg twice daily x 14 days. Abscess Hx: Back nodule for years, almays been able to squeeze it.  October 30, 2021: scrotal abscess SP I&D with Cx+ GBS. Abscess started after dog hit him in the groin after scrathcing him. Admitted to [facility] and treated wth IV clinda and bactrim.  Aug, 14,23: perreactal abscees with wound Cx. Pt has hemmarhoids.  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   Past Medical History:  Diagnosis Date   Cholangitis 12/11   [facility] admission - ERCP for stone removal per GI   Diabetes mellitus without complication ([place])    Elevated glucose 10/16 to 06/09/2004   [facility] admission, mild elevated cholesterol   History of echocardiogram    a. 05/2017 Echo: EF 55-60%, mild LVH, no rwma, mild MR.   Hyperlipidemia 12/21/1997   Morbid obesity ([place])    Non-obstructive CAD (coronary artery disease)    a. 05/2016 Abnl St Test: intermediate risk, large, partially reversible inf/infsept defect consistent w/ ischemia/? scar;  b. 06/2016 Cath: LM nl, LAD 17m RI nl, LCX nl, OM1/2/3 nl, RCA 40p/m, 30d, RPDA/RPAV nl.   Poisoning by black widow spider bite  1979   RBBB    Renal stones    Tobacco abuse     Outpatient Medications Prior to Visit  Medication Sig Dispense Refill   ACCU-CHEK AVIVA PLUS test strip CHECK SUGAR DAILY UP TO 3 TIMES DAILY. INSULIN TREATED DM2 100 each 12   acetaminophen (TYLENOL) 500 MG tablet Take 1,000 mg by mouth in the morning and at bedtime.     amoxicillin (AMOXIL) 500 MG tablet Take 2 tablets (1,000 mg total) by mouth 2 (two) times daily. 28 tablet 1   aspirin EC 81 MG tablet Take 1 tablet (81 mg total) by mouth  daily. 90 tablet 3   B-D UF III MINI PEN NEEDLES 31G X 5 MM MISC USE DAILY WITH INSULIN PEN 100 each 3   betamethasone dipropionate 0.05 % cream Apply 1 application. topically daily as needed (for psoriasis- apply to affected areas).     cetirizine (ZYRTEC) 10 MG tablet Take 10 mg by mouth daily as needed (for seasonal allergies with lawn mowing).      cyanocobalamin (VITAMIN B12) 1000 MCG/ML injection 1000 mcg injected IM weekly x4 weeks then monthly thereafter. 4 mL 2   gabapentin (NEURONTIN) 600 MG tablet Take 600 mg by mouth in the morning and at bedtime.     LANTUS SOLOSTAR 100 UNIT/ML Solostar Pen INJECT 32 UNITS UNDER THE SKIN ONCE DAILY 15 mL 3   metFORMIN (GLUCOPHAGE) 500 MG tablet Take 1 tablet (500 mg total) by mouth at bedtime. 90 tablet 3   pravastatin (PRAVACHOL) 40 MG tablet TAKE 1 TABLET BY MOUTH EVERY NIGHT AT BEDTIME 90 tablet 3   SYRINGE-NEEDLE, DISP, 3 ML 25G X 1" 3 ML MISC Use with B12 injection 10 each 1   No facility-administered medications prior to visit.     Allergies  Allergen Reactions   Chantix [Varenicline] Other (See Comments)    Intolerant    Codeine Other (See Comments)    GI upset   Metformin And Related Other (See Comments)    Max dose 565m-1000mg a day    Social History   Tobacco Use   Smoking status: Every Day    Packs/day: 0.25    Years: 35.00    Total pack years: 8.75    Types: Cigarettes   Smokeless tobacco: Never  Vaping Use   Vaping Use: Former  Substance Use Topics   Alcohol use: Yes    Alcohol/week: 2.0 standard drinks of alcohol    Types: 2 Shots of liquor per week    Comment: socially   Drug use: No    Family History  Problem Relation Age of Onset   Cancer Mother        Pancreatic   Hyperlipidemia Mother    CVA Father    Stroke Brother    Heart disease Brother    Heart disease Paternal Grandfather        MI   Diabetes Neg Hx    Alcohol abuse Neg Hx    Drug abuse Neg Hx    Depression Neg Hx    Colon cancer Neg Hx     Prostate cancer Neg Hx     Objective:  There were no vitals filed for this visit. There is no height or weight on file to calculate BMI.  Physical Exam Constitutional:      General: He is not in acute distress.    Appearance: He is normal weight. He is not toxic-appearing.  HENT:     Head: Normocephalic and atraumatic.  Right Ear: External ear normal.     Left Ear: External ear normal.     Nose: No congestion or rhinorrhea.     Mouth/Throat:     Mouth: Mucous membranes are moist.     Pharynx: Oropharynx is clear.  Eyes:     Extraocular Movements: Extraocular movements intact.     Conjunctiva/sclera: Conjunctivae normal.     Pupils: Pupils are equal, round, and reactive to light.  Cardiovascular:     Rate and Rhythm: Normal rate and regular rhythm.     Heart sounds: No murmur heard.    No friction rub. No gallop.  Pulmonary:     Effort: Pulmonary effort is normal.     Breath sounds: Normal breath sounds.  Abdominal:     General: Abdomen is flat. Bowel sounds are normal.     Palpations: Abdomen is soft.  Musculoskeletal:        General: No swelling. Normal range of motion.     Cervical back: Normal range of motion and neck supple.  Skin:    General: Skin is warm and dry.  Neurological:     General: No focal deficit present.     Mental Status: He is oriented to person, place, and time.  Psychiatric:        Mood and Affect: Mood normal.        Lab Results: Lab Results  Component Value Date   WBC 6.7 07/02/2022   HGB 15.5 07/02/2022   HCT 46.6 07/02/2022   MCV 96.1 07/02/2022   PLT 140.0 (L) 07/02/2022    Lab Results  Component Value Date   CREATININE 0.85 07/02/2022   BUN 15 07/02/2022   NA 138 07/02/2022   K 4.9 07/02/2022   CL 106 07/02/2022   CO2 29 07/02/2022    Lab Results  Component Value Date   ALT 17 07/02/2022   AST 14 07/02/2022   ALKPHOS 53 07/02/2022   BILITOT 0.4 07/02/2022     Assessment & Plan:   #Perirectal abscess SP I&D  with Cx+ GBS  Conditions such as lymphedema, vascular insufficiency, chronic dermatitis, or radiation-induced cutaneous injury are frequent predisposing factors. DM is also an underlying condition with GBS. Etiology of the abscess is unclear.  I suspect symptomology is: Patient has had a history of multiple subcutaneous nodules throughout his life.  I believe trauma to these areas since he is active probably related to superimposed infection.  In addition he continues to drain the abscesses which probably introduces bacteria.  Given that he is diabetic and smoker did endorse wound healing Plan: - Refer to dermatology for I&D please obtain AFB(scrotal abscess loculated, and pt note expressed material is appearance ric-m abscesses?), fungal and bacterial cultures as well as biopsy with pathology.  As to rule out any noninfectious causes.  - Labs today CBC, CMP, ESR, CRP - Follow-up in 1 month, refill amoxicillin.  Patient reports amoxicillin has decreased the size of the abscess.  #Dm with A1c 8.3 - Needs baseline control  #Tobacco abuse - Counseled on smoking cessation  I have personally spent 65 minutes involved in face-to-face and non-face-to-face activities for this patient on the day of the visit. Professional time spent includes the following activities: Preparing to see the patient (review of tests), Obtaining and/or reviewing separately obtained history (admission/discharge record), Performing a medically appropriate examination and/or evaluation , Ordering medications/tests/procedures, referring and communicating with other health care professionals, Documenting clinical information in the EMR, Independently interpreting results (not separately reported),  Communicating results to the patient/family/caregiver, Counseling and educating the patient/family/caregiver and Care coordination (not separately reported).   Laurice Record, MD [facility] [place]  [facility]   08/25/22  8:30 AM

## 2022-08-26 LAB — COMPREHENSIVE METABOLIC PANEL
AG Ratio: 1.5 (calc) (ref 1.0–2.5)
ALT: 22 U/L (ref 9–46)
AST: 15 U/L (ref 10–35)
Albumin: 4.2 g/dL (ref 3.6–5.1)
Alkaline phosphatase (APISO): 62 U/L (ref 35–144)
BUN: 12 mg/dL (ref 7–25)
CO2: 24 mmol/L (ref 20–32)
Calcium: 9.3 mg/dL (ref 8.6–10.3)
Chloride: 102 mmol/L (ref 98–110)
Creat: 0.93 mg/dL (ref 0.70–1.28)
Globulin: 2.8 g/dL (calc) (ref 1.9–3.7)
Glucose, Bld: 447 mg/dL — ABNORMAL HIGH (ref 65–99)
Potassium: 4.5 mmol/L (ref 3.5–5.3)
Sodium: 135 mmol/L (ref 135–146)
Total Bilirubin: 0.5 mg/dL (ref 0.2–1.2)
Total Protein: 7 g/dL (ref 6.1–8.1)

## 2022-08-26 LAB — CBC WITH DIFFERENTIAL/PLATELET

## 2022-08-26 LAB — C-REACTIVE PROTEIN: CRP: 3.7 mg/L (ref <8.0)

## 2022-08-26 LAB — SEDIMENTATION RATE

## 2022-09-23 ENCOUNTER — Ambulatory Visit: Payer: Medicare Other | Admitting: Internal Medicine

## 2022-10-05 ENCOUNTER — Encounter: Payer: Self-pay | Admitting: Family Medicine

## 2022-10-05 ENCOUNTER — Ambulatory Visit: Payer: Medicare Other | Admitting: Family Medicine

## 2022-10-05 VITALS — BP 136/80 | HR 83 | Temp 97.3°F | Ht 71.0 in | Wt 203.0 lb

## 2022-10-05 DIAGNOSIS — Z794 Long term (current) use of insulin: Secondary | ICD-10-CM | POA: Diagnosis not present

## 2022-10-05 DIAGNOSIS — E119 Type 2 diabetes mellitus without complications: Secondary | ICD-10-CM | POA: Diagnosis not present

## 2022-10-05 DIAGNOSIS — L989 Disorder of the skin and subcutaneous tissue, unspecified: Secondary | ICD-10-CM | POA: Diagnosis not present

## 2022-10-05 DIAGNOSIS — E538 Deficiency of other specified B group vitamins: Secondary | ICD-10-CM | POA: Diagnosis not present

## 2022-10-05 LAB — HEMOGLOBIN A1C: Hgb A1c MFr Bld: 10.3 % — ABNORMAL HIGH (ref 4.6–6.5)

## 2022-10-05 LAB — VITAMIN B12: Vitamin B-12: 326 pg/mL (ref 211–911)

## 2022-10-05 MED ORDER — CYANOCOBALAMIN 1000 MCG/ML IJ SOLN: INTRAMUSCULAR | 2 refills | Status: AC

## 2022-10-05 NOTE — Patient Instructions (Addendum)
Go to the lab on the way out.   If you have mychart we'll likely use that to update you.    Take care.  Glad to see you.  Plan on a yearly visit in about 3-4 months with labs ahead of time.

## 2022-10-05 NOTE — Progress Notes (Unsigned)
Diabetes:  Using medications without difficulties: yes Hypoglycemic episodes:no Hyperglycemic episodes:no Feet problems: no  Blood Sugars averaging: usually ~150 but this AM 326 (diet was off recently) eye exam within last year: yes, has f/u pending.  32 units lantus and 1 metformin.    Skin nodule on the R shoulder resolved in the meantime.  No redness and minimal skin thickening at the prev site.  Discussed getting eval done with possible biopsy if recurrent.    Smoking 5 cigs per day.  D/w pt about taper and cessation.    Now on monthly B12 dose, d/w pt.    PMH and SH reviewed  Meds, vitals, and allergies reviewed.   ROS: Per HPI unless specifically indicated in ROS section   GEN: nad, alert and oriented HEENT: mucous membranes moist NECK: supple w/o LA CV: rrr. PULM: ctab, no inc wob ABD: soft, +bs EXT: no edema SKIN: no acute rash   Make plans after seeing labs.

## 2022-10-06 DIAGNOSIS — L989 Disorder of the skin and subcutaneous tissue, unspecified: Secondary | ICD-10-CM | POA: Insufficient documentation

## 2022-10-06 NOTE — Assessment & Plan Note (Signed)
Benign exam today.  If he has recurrent symptoms he can let me know.  Discussed previous infectious disease recommendations.  Would defer extra intervention at this point since his symptoms have significantly improved.

## 2022-10-06 NOTE — Assessment & Plan Note (Signed)
Continue insulin and metformin as is for now.  See notes on labs.

## 2022-10-18 ENCOUNTER — Telehealth: Payer: Self-pay | Admitting: Family Medicine

## 2022-10-18 NOTE — Telephone Encounter (Signed)
Patient came by and dropped off a log of his blood sugar readings. Left in [place] folder up front.

## 2022-10-19 NOTE — Telephone Encounter (Signed)
BP readings placed in Dr. Carole Civil inbox for review.

## 2022-10-20 LAB — HM DIABETES EYE EXAM

## 2022-10-20 NOTE — Telephone Encounter (Signed)
Please update patient.  I appreciate his help.  He gradually increased his insulin up to 37 units and his sugar came down to 129.  It looks like he has been using 35 to 37 units recently, depending on his sugars.  If his sugars stay reasonable (meaning between 100 and 150) then I would continue as is.  If his sugars are above 150 then I would add a unit.  If he cannot keep his sugars under reasonable control in the meantime then let me know.  I think it makes sense to recheck in May as planned.  Thanks.

## 2022-10-22 NOTE — Telephone Encounter (Signed)
Called patient and discussed below. Patient verbalized understanding and will call back if needed.

## 2022-11-18 ENCOUNTER — Telehealth: Payer: Self-pay | Admitting: Family Medicine

## 2022-11-18 NOTE — Telephone Encounter (Signed)
Called patient to schedule Medicare Annual Wellness Visit (AWV). Left message for patient to call back and schedule Medicare Annual Wellness Visit (AWV).  Last date of AWV: 12/01/2022  Please schedule an appointment at any time with [facility] .  If any questions, please contact me at [phone].  Thank you ,  [place] Direct Dial: [phone]

## 2022-11-19 ENCOUNTER — Other Ambulatory Visit: Payer: Self-pay | Admitting: Family Medicine

## 2022-11-23 ENCOUNTER — Telehealth: Payer: Self-pay | Admitting: Family Medicine

## 2022-11-23 NOTE — Telephone Encounter (Signed)
Called patient to schedule Medicare Annual Wellness Visit (AWV). Left message for patient to call back and schedule Medicare Annual Wellness Visit (AWV).  Last date of AWV: 11/30/2021  Please schedule an appointment at any time with [facility].  If any questions, please contact me at [phone].  Thank you ,  Bernice Cicero Care Guide [facility] AWV TEAM Direct Dial: [phone]   

## 2022-11-24 ENCOUNTER — Telehealth: Payer: Self-pay | Admitting: Family Medicine

## 2022-11-24 NOTE — Telephone Encounter (Signed)
Contacted Carl Barnes to schedule their annual wellness visit. Appointment made for 12/15/2022.  [place] Direct Dial: [phone]

## 2022-11-24 NOTE — Telephone Encounter (Signed)
Contacted Carl Barnes to schedule their annual wellness visit. Appointment made for 12/15/2022.  [place] Direct Dial: [phone]

## 2022-12-15 ENCOUNTER — Ambulatory Visit (INDEPENDENT_AMBULATORY_CARE_PROVIDER_SITE_OTHER): Payer: Medicare Other

## 2022-12-15 VITALS — Ht 69.0 in | Wt 204.0 lb

## 2022-12-15 DIAGNOSIS — Z1211 Encounter for screening for malignant neoplasm of colon: Secondary | ICD-10-CM

## 2022-12-15 DIAGNOSIS — Z0000 Encounter for general adult medical examination without abnormal findings: Secondary | ICD-10-CM | POA: Diagnosis not present

## 2022-12-15 NOTE — Progress Notes (Signed)
Subjective:   Carl Barnes is a 72 y.o. male who presents for Medicare Annual/Subsequent preventive examination.  Review of Systems      Cardiac Risk Factors include: advanced age (>25men, >9 women);hypertension;diabetes mellitus;male gender;sedentary lifestyle     Objective:    Today's Vitals   12/15/22 0831  Weight: 204 lb (92.5 kg)  Height: 5\' 9"  (1.753 m)   Body mass index is 30.13 kg/m.     12/15/2022    8:43 AM 10/30/2021    7:59 PM 10/30/2021   12:27 PM 10/29/2019   10:34 AM 10/18/2017    8:55 AM 07/06/2016    7:57 AM 06/08/2016   10:32 AM  Advanced Directives  Does Patient Have a Medical Advance Directive? No Yes No No No No No  Type of Furniture conservator/restorer;Living will       Does patient want to make changes to medical advance directive?  No - Patient declined       Copy of Healthcare Power of Attorney in Chart?  No - copy requested       Would patient like information on creating a medical advance directive? No - Patient declined No - Patient declined No - Patient declined No - Patient declined Yes (MAU/Ambulatory/Procedural Areas - Information given)  No - patient declined information    Current Medications (verified) Outpatient Encounter Medications as of 12/15/2022  Medication Sig   ACCU-CHEK AVIVA PLUS test strip CHECK SUGAR DAILY UP TO 3 TIMES DAILY. INSULIN TREATED DM2   acetaminophen (TYLENOL) 500 MG tablet Take 1,000 mg by mouth in the morning and at bedtime.   aspirin EC 81 MG tablet Take 1 tablet (81 mg total) by mouth daily.   B-D UF III MINI PEN NEEDLES 31G X 5 MM MISC USE DAILY WITH INSULIN PEN   cetirizine (ZYRTEC) 10 MG tablet Take 10 mg by mouth daily as needed (for seasonal allergies with lawn mowing).    cyanocobalamin (VITAMIN B12) 1000 MCG/ML injection 1000 mcg injected monthly   gabapentin (NEURONTIN) 600 MG tablet Take 600 mg by mouth in the morning and at bedtime.   LANTUS SOLOSTAR 100 UNIT/ML Solostar Pen INJECT  32 UNITS UNDER THE SKIN ONCE DAILY   metFORMIN (GLUCOPHAGE) 500 MG tablet Take 1 tablet (500 mg total) by mouth at bedtime.   pravastatin (PRAVACHOL) 40 MG tablet TAKE 1 TABLET BY MOUTH EVERY NIGHT AT BEDTIME   SYRINGE-NEEDLE, DISP, 3 ML 25G X 1" 3 ML MISC Use with B12 injection   betamethasone dipropionate 0.05 % cream Apply 1 application. topically daily as needed (for psoriasis- apply to affected areas). (Patient not taking: Reported on 12/15/2022)   No facility-administered encounter medications on file as of 12/15/2022.    Allergies (verified) Chantix [varenicline], Codeine, and Metformin and related   History: Past Medical History:  Diagnosis Date   Cholangitis 12/11   [facility] admission - ERCP for stone removal per GI   Diabetes mellitus without complication    Elevated glucose 10/16 to 06/09/2004   [facility] admission, mild elevated cholesterol   History of echocardiogram    a. 05/2017 Echo: EF 55-60%, mild LVH, no rwma, mild MR.   Hyperlipidemia 12/21/1997   Morbid obesity    Non-obstructive CAD (coronary artery disease)    a. 05/2016 Abnl St Test: intermediate risk, large, partially reversible inf/infsept defect consistent w/ ischemia/? scar;  b. 06/2016 Cath: LM nl, LAD 53m, RI nl, LCX nl, OM1/2/3 nl, RCA 40p/m, 30d, RPDA/RPAV nl.  Poisoning by black widow spider bite 1979   RBBB    Renal stones    Tobacco abuse    Past Surgical History:  Procedure Laterality Date   CARDIAC CATHETERIZATION Left 07/06/2016   Procedure: Left Heart Cath and Coronary Angiography;  Surgeon: Yvonne Kendall, MD;  Location: [facility] INVASIVE CV LAB;  Service: Cardiovascular;  Laterality: Left;   Cardiolite  06/08/2004   WNL   CATARACT EXTRACTION W/ INTRAOCULAR LENS IMPLANT Right 10/13/2017   Dr. Burgess Estelle   CATARACT EXTRACTION W/ INTRAOCULAR LENS IMPLANT Left 09/08/2017   Dr.Tanner   Chest CT w/contrast  06/08/04   Negative PE   CHOLECYSTECTOMY  01/2011   Diseased GB.  [facility] MRI  12/99   Normal  (R/O acoustic neuroma)   Family History  Problem Relation Age of Onset   Cancer Mother        Pancreatic   Hyperlipidemia Mother    CVA Father    Stroke Brother    Heart disease Brother    Heart disease Paternal Grandfather        MI   Diabetes Neg Hx    Alcohol abuse Neg Hx    Drug abuse Neg Hx    Depression Neg Hx    Colon cancer Neg Hx    Prostate cancer Neg Hx    Social History   Socioeconomic History   Marital status: Married    Spouse name: Not on file   Number of children: 1   Years of education: Not on file   Highest education level: Not on file  Occupational History   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  Tobacco Use   Smoking status: Every Day    Packs/day: 0.25    Years: 35.00    Additional pack years: 0.00    Total pack years: 8.75    Types: Cigarettes   Smokeless tobacco: Never  Vaping Use   Vaping Use: Former  Substance and Sexual Activity   Alcohol use: Yes    Alcohol/week: 2.0 standard drinks of alcohol    Types: 2 Shots of liquor per week    Comment: socially   Drug use: No   Sexual activity: Not on file  Other Topics Concern   Not on file  Social History Narrative   Married, 2nd marriage 1997   1 adult son   Retired Merchandiser, 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     [employer] fan   Social Determinants of Health   Financial Resource Strain: Low Risk  (12/15/2022)   Overall Financial Resource Strain (CARDIA)    Difficulty of Paying Living Expenses: Not hard at all  Food Insecurity: No Food Insecurity (12/15/2022)   Hunger Vital Sign    Worried About Running Out of Food in the Last Year: Never true    Ran Out of Food in the Last Year: Never true  Transportation Needs: No Transportation Needs (12/15/2022)   PRAPARE - Administrator, Civil Service (Medical): No    Lack of Transportation (Non-Medical): No  Physical  Activity: Sufficiently Active (12/15/2022)   Exercise Vital Sign    Days of Exercise per Week: 5 days    Minutes of Exercise per Session: 60 min  Stress: No Stress Concern Present (12/15/2022)   [employer] of Occupational Health - Occupational Stress Questionnaire    Feeling of Stress :  Not at all  Social Connections: Moderately Integrated (12/15/2022)   Social Connection and Isolation Panel [NHANES]    Frequency of Communication with Friends and Family: More than three times a week    Frequency of Social Gatherings with Friends and Family: More than three times a week    Attends Religious Services: More than 4 times per year    Active Member of [employer] or Organizations: No    Attends Engineer, structural: Never    Marital Status: Married    Tobacco Counseling Ready to quit: No Counseling given: Yes   Clinical Intake:  Pre-visit preparation completed: Yes  Pain : No/denies pain     Nutritional Risks: Nausea/ vomitting/ diarrhea (occasional loose stools) Diabetes: Yes CBG done?: No Did pt. bring in CBG monitor from home?: No  How often do you need to have someone help you when you read instructions, pamphlets, or other written materials from your doctor or pharmacy?: 1 - Never  Diabetic?Marland Kitchen  Interpreter Needed?: No  Information entered by :: C.Yanni Quiroa LPN   Activities of Daily Living    12/15/2022    8:43 AM  In your present state of health, do you have any difficulty performing the following activities:  Hearing? 0  Vision? 0  Difficulty concentrating or making decisions? 1  Comment occasionally forgets  Walking or climbing stairs? 0  Dressing or bathing? 0  Doing errands, shopping? 0  Preparing Food and eating ? N  Using the Toilet? N  In the past six months, have you accidently leaked urine? N  Do you have problems with loss of bowel control? N  Managing your Medications? N  Managing your Finances? N  Housekeeping or managing your Housekeeping?  N    Patient Care Team: Joaquim Nam, MD as PCP - General (Family Medicine) End, Cristal Deer, MD as PCP - Cardiology (Cardiology) Janet Berlin, MD as Consulting Physician (Ophthalmology)  Indicate any recent Medical Services you may have received from other than [facility] providers in the past year (date may be approximate).     Assessment:   This is a routine wellness examination for Shubh.  Hearing/Vision screen Hearing Screening - Comments:: No aids Vision Screening - Comments:: Readers - [facility]  Dietary issues and exercise activities discussed: Current Exercise Habits: Home exercise routine (works in the yard), Type of exercise: Other - see comments (works in yard), Time (Minutes): 60, Frequency (Times/Week): 5, Weekly Exercise (Minutes/Week): 300, Intensity: Mild, Exercise limited by: None identified   Goals Addressed   None    Depression Screen    12/15/2022    8:42 AM 10/05/2022   11:04 AM 08/06/2022    8:01 AM 11/30/2021   10:47 AM 10/29/2019   10:35 AM 10/27/2018    9:01 AM 10/18/2017    8:56 AM  PHQ 2/9 Scores  PHQ - 2 Score 0 0 0 0 0 0 0  PHQ- 9 Score  2   0  0    Fall Risk    12/15/2022    8:35 AM 10/05/2022   11:04 AM 08/06/2022    8:01 AM 11/30/2021   10:46 AM 10/16/2020   11:10 AM  Fall Risk   Falls in the past year? 0 0 0 0 0  Number falls in past yr: 0 0 0 0 0  Injury with Fall? 0 0 0 0 0  Risk for fall due to : No Fall Risks No Fall Risks No Fall Risks No Fall Risks   Follow  up Falls prevention discussed;Falls evaluation completed Falls evaluation completed Falls evaluation completed Falls evaluation completed Falls evaluation completed    FALL RISK PREVENTION PERTAINING TO THE HOME:  Any stairs in or around the home? Yes  If so, are there any without handrails? No  Home free of loose throw rugs in walkways, pet beds, electrical cords, etc? Yes  Adequate lighting in your home to reduce risk of falls? Yes   ASSISTIVE DEVICES  UTILIZED TO PREVENT FALLS:  Life alert? No  Use of a cane, walker or w/c? No  Grab bars in the bathroom? Yes  Shower chair or bench in shower? No  Elevated toilet seat or a handicapped toilet? No   Cognitive Function:    10/29/2019   10:36 AM 10/18/2017    8:56 AM  MMSE - Mini Mental State Exam  Orientation to time 5 5  Orientation to Place 5 5  Registration 3 3  Attention/ Calculation 5 0  Recall 3 3  Language- name 2 objects  0  Language- repeat 1 1  Language- follow 3 step command  3  Language- read & follow direction  0  Write a sentence  0  Copy design  0  Total score  20        12/15/2022    8:45 AM  6CIT Screen  What Year? 0 points  What month? 0 points  What time? 0 points  Count back from 20 0 points  Months in reverse 0 points  Repeat phrase 0 points  Total Score 0 points    Immunizations Immunization History  Administered Date(s) Administered   Fluad Quad(high Dose 65+) 04/24/2019   Influenza, High Dose Seasonal PF 10/06/2021, 05/03/2022   Influenza,inj,Quad PF,6+ Mos 06/24/2017, 05/05/2018   Influenza-Unspecified 05/12/2016, 05/23/2020   PFIZER(Purple Top)SARS-COV-2 Vaccination 09/15/2019, 10/06/2019, 06/17/2020   Pneumococcal Conjugate-13 10/31/2017   Pneumococcal Polysaccharide-23 04/03/2019   Td 08/23/1996   Tdap 05/23/2018    TDAP status: Up to date  Flu Vaccine status: Up to date  Pneumococcal vaccine status: Up to date  Covid-19 vaccine status: Information provided on how to obtain vaccines.   Qualifies for Shingles Vaccine? Yes   Zostavax completed No   Shingrix Completed?: No.    Education has been provided regarding the importance of this vaccine. Patient has been advised to call insurance company to determine out of pocket expense if they have not yet received this vaccine. Advised may also receive vaccine at local pharmacy or Health Dept. Verbalized acceptance and understanding.  Screening Tests Health Maintenance  Topic Date Due    Zoster Vaccines- Shingrix (1 of 2) Never done   COLONOSCOPY (Pts 45-79yrs Insurance coverage will need to be confirmed)  04/11/2022   COVID-19 Vaccine (4 - 2023-24 season) 04/23/2022   Diabetic kidney evaluation - Urine ACR  11/26/2022   FOOT EXAM  12/01/2022   INFLUENZA VACCINE  03/24/2023   HEMOGLOBIN A1C  04/05/2023   Diabetic kidney evaluation - eGFR measurement  08/26/2023   OPHTHALMOLOGY EXAM  10/21/2023   Medicare Annual Wellness (AWV)  12/15/2023   DTaP/Tdap/Td (3 - Td or Tdap) 05/23/2028   Pneumonia Vaccine 21+ Years old  Completed   Hepatitis C Screening  Completed   HPV VACCINES  Aged Out    Health Maintenance  Health Maintenance Due  Topic Date Due   Zoster Vaccines- Shingrix (1 of 2) Never done   COLONOSCOPY (Pts 45-65yrs Insurance coverage will need to be confirmed)  04/11/2022   COVID-19 Vaccine (4 -  2023-24 season) 04/23/2022   Diabetic kidney evaluation - Urine ACR  11/26/2022   FOOT EXAM  12/01/2022    Colorectal cancer screening: Type of screening: Colonoscopy. Completed 04/11/12. Repeat every 3 years  Lung Cancer Screening: (Low Dose CT Chest recommended if Age 84-80 years, 30 pack-year currently smoking OR have quit w/in 15years.) does qualify.   Lung Cancer Screening Referral: will discuss with PCP  Additional Screening:  Hepatitis C Screening: does qualify; Completed 08/12/10  Vision Screening: Recommended annual ophthalmology exams for early detection of glaucoma and other disorders of the eye. Is the patient up to date with their annual eye exam?  Yes  Who is the provider or what is the name of the office in which the patient attends annual eye exams? [facility] opthalmology If pt is not established with a provider, would they like to be referred to a provider to establish care? No .   Dental Screening: Recommended annual dental exams for proper oral hygiene  Community Resource Referral / Chronic Care Management: CRR required this visit?  No    CCM required this visit?  No      Plan:     I have personally reviewed and noted the following in the patient's chart:   Medical and social history Use of alcohol, tobacco or illicit drugs  Current medications and supplements including opioid prescriptions. Patient is not currently taking opioid prescriptions. Functional ability and status Nutritional status Physical activity Advanced directives List of other physicians Hospitalizations, surgeries, and ER visits in previous 12 months Vitals Screenings to include cognitive, depression, and falls Referrals and appointments  In addition, I have reviewed and discussed with patient certain preventive protocols, quality metrics, and best practice recommendations. A written personalized care plan for preventive services as well as general preventive health recommendations were provided to patient.     Maryan Puls, LPN   1/61/0960   Nurse Notes: Order for colonoscopy placed.

## 2022-12-15 NOTE — Patient Instructions (Addendum)
Carl Barnes , Thank you for taking time to come for your Medicare Wellness Visit. I appreciate your ongoing commitment to your health goals. Please review the following plan we discussed and let me know if I can assist you in the future.   These are the goals we discussed:  Goals      DIET - INCREASE LEAN PROTEINS     Starting 10/18/2017, I will continue to eat lean proteins, fruits, and vegetables in an effort to continue losing weight.      Patient Stated     10/29/2019, I will maintain and continue medications as prescribed.         This is a list of the screening recommended for you and due dates:  Health Maintenance  Topic Date Due   Zoster (Shingles) Vaccine (1 of 2) Never done   Colon Cancer Screening  04/11/2022   COVID-19 Vaccine (4 - 2023-24 season) 04/23/2022   Yearly kidney health urinalysis for diabetes  11/26/2022   Complete foot exam   12/01/2022   Flu Shot  03/24/2023   Hemoglobin A1C  04/05/2023   Yearly kidney function blood test for diabetes  08/26/2023   Eye exam for diabetics  10/21/2023   Medicare Annual Wellness Visit  12/15/2023   DTaP/Tdap/Td vaccine (3 - Td or Tdap) 05/23/2028   Pneumonia Vaccine  Completed   Hepatitis C Screening: USPSTF Recommendation to screen - Ages 11-79 yo.  Completed   HPV Vaccine  Aged Out    Advanced directives: none  Conditions/risks identified: Aim for 30 minutes of exercise or brisk walking, 6-8 glasses of water, and 5 servings of fruits and vegetables each day.   If you wish to quit smoking, help is available. For free tobacco cessation program offerings call the [facility] at (979)390-4668 or [facility] at 828-112-6673. You may also visit www.[facility].com or email livelifewell@[facility] .com for more information on other programs.   You may also call 1-800-QUIT-NOW ((510)385-6388) or visit www.[facility].ch or www.[facility].org for additional resources on smoking cessation.   Next appointment:  Follow up in one year for your annual wellness visit. 12/19/23 @ 9:45   Preventive Care 65 Years and Older, Male  Preventive care refers to lifestyle choices and visits with your health care provider that can promote health and wellness. What does preventive care include? A yearly physical exam. This is also called an annual well check. Dental exams once or twice a year. Routine eye exams. Ask your health care provider how often you should have your eyes checked. Personal lifestyle choices, including: Daily care of your teeth and gums. Regular physical activity. Eating a healthy diet. Avoiding tobacco and drug use. Limiting alcohol use. Practicing safe sex. Taking low doses of aspirin every day. Taking vitamin and mineral supplements as recommended by your health care provider. What happens during an annual well check? The services and screenings done by your health care provider during your annual well check will depend on your age, overall health, lifestyle risk factors, and family history of disease. Counseling  Your health care provider may ask you questions about your: Alcohol use. Tobacco use. Drug use. Emotional well-being. Home and relationship well-being. Sexual activity. Eating habits. History of falls. Memory and ability to understand (cognition). Work and work Astronomer. Screening  You may have the following tests or measurements: Height, weight, and BMI. Blood pressure. Lipid and cholesterol levels. These may be checked every 5 years, or more frequently if you are over 50  years old. Skin check. Lung cancer screening. You may have this screening every year starting at age 60 if you have a 30-pack-year history of smoking and currently smoke or have quit within the past 15 years. Fecal occult blood test (FOBT) of the stool. You may have this test every year starting at age 29. Flexible sigmoidoscopy or colonoscopy. You may have a sigmoidoscopy every 5 years or a  colonoscopy every 10 years starting at age 12. Prostate cancer screening. Recommendations will vary depending on your family history and other risks. Hepatitis C blood test. Hepatitis B blood test. Sexually transmitted disease (STD) testing. Diabetes screening. This is done by checking your blood sugar (glucose) after you have not eaten for a while (fasting). You may have this done every 1-3 years. Abdominal aortic aneurysm (AAA) screening. You may need this if you are a current or former smoker. Osteoporosis. You may be screened starting at age 68 if you are at high risk. Talk with your health care provider about your test results, treatment options, and if necessary, the need for more tests. Vaccines  Your health care provider may recommend certain vaccines, such as: Influenza vaccine. This is recommended every year. Tetanus, diphtheria, and acellular pertussis (Tdap, Td) vaccine. You may need a Td booster every 10 years. Zoster vaccine. You may need this after age 46. Pneumococcal 13-valent conjugate (PCV13) vaccine. One dose is recommended after age 35. Pneumococcal polysaccharide (PPSV23) vaccine. One dose is recommended after age 28. Talk to your health care provider about which screenings and vaccines you need and how often you need them. This information is not intended to replace advice given to you by your health care provider. Make sure you discuss any questions you have with your health care provider. Document Released: 09/05/2015 Document Revised: 04/28/2016 Document Reviewed: 06/10/2015 Elsevier Interactive Patient Education  2017 [employer].  Fall Prevention in the Home Falls can cause injuries. They can happen to people of all ages. There are many things you can do to make your home safe and to help prevent falls. What can I do on the outside of my home? Regularly fix the edges of walkways and driveways and fix any cracks. Remove anything that might make you trip as you  walk through a door, such as a raised step or threshold. Trim any bushes or trees on the path to your home. Use bright outdoor lighting. Clear any walking paths of anything that might make someone trip, such as rocks or tools. Regularly check to see if handrails are loose or broken. Make sure that both sides of any steps have handrails. Any raised decks and porches should have guardrails on the edges. Have any leaves, snow, or ice cleared regularly. Use sand or salt on walking paths during winter. Clean up any spills in your garage right away. This includes oil or grease spills. What can I do in the bathroom? Use night lights. Install grab bars by the toilet and in the tub and shower. Do not use towel bars as grab bars. Use non-skid mats or decals in the tub or shower. If you need to sit down in the shower, use a plastic, non-slip stool. Keep the floor dry. Clean up any water that spills on the floor as soon as it happens. Remove soap buildup in the tub or shower regularly. Attach bath mats securely with double-sided non-slip rug tape. Do not have throw rugs and other things on the floor that can make you trip. What can I  do in the bedroom? Use night lights. Make sure that you have a light by your bed that is easy to reach. Do not use any sheets or blankets that are too big for your bed. They should not hang down onto the floor. Have a firm chair that has side arms. You can use this for support while you get dressed. Do not have throw rugs and other things on the floor that can make you trip. What can I do in the kitchen? Clean up any spills right away. Avoid walking on wet floors. Keep items that you use a lot in easy-to-reach places. If you need to reach something above you, use a strong step stool that has a grab bar. Keep electrical cords out of the way. Do not use floor polish or wax that makes floors slippery. If you must use wax, use non-skid floor wax. Do not have throw rugs  and other things on the floor that can make you trip. What can I do with my stairs? Do not leave any items on the stairs. Make sure that there are handrails on both sides of the stairs and use them. Fix handrails that are broken or loose. Make sure that handrails are as long as the stairways. Check any carpeting to make sure that it is firmly attached to the stairs. Fix any carpet that is loose or worn. Avoid having throw rugs at the top or bottom of the stairs. If you do have throw rugs, attach them to the floor with carpet tape. Make sure that you have a light switch at the top of the stairs and the bottom of the stairs. If you do not have them, ask someone to add them for you. What else can I do to help prevent falls? Wear shoes that: Do not have high heels. Have rubber bottoms. Are comfortable and fit you well. Are closed at the toe. Do not wear sandals. If you use a stepladder: Make sure that it is fully opened. Do not climb a closed stepladder. Make sure that both sides of the stepladder are locked into place. Ask someone to hold it for you, if possible. Clearly mark and make sure that you can see: Any grab bars or handrails. First and last steps. Where the edge of each step is. Use tools that help you move around (mobility aids) if they are needed. These include: Canes. Walkers. Scooters. Crutches. Turn on the lights when you go into a dark area. Replace any light bulbs as soon as they burn out. Set up your furniture so you have a clear path. Avoid moving your furniture around. If any of your floors are uneven, fix them. If there are any pets around you, be aware of where they are. Review your medicines with your doctor. Some medicines can make you feel dizzy. This can increase your chance of falling. Ask your doctor what other things that you can do to help prevent falls. This information is not intended to replace advice given to you by your health care provider. Make sure  you discuss any questions you have with your health care provider. Document Released: 06/05/2009 Document Revised: 01/15/2016 Document Reviewed: 09/13/2014 Elsevier Interactive Patient Education  2017 [employer].

## 2022-12-20 NOTE — Progress Notes (Signed)
I connected with  Carl Barnes on 12/20/22 by a audio enabled telemedicine application and verified that I am speaking with the correct person using two identifiers.  Patient Location: Home  Provider Location: Home Office  I discussed the limitations of evaluation and management by telemedicine. The patient expressed understanding and agreed to proceed.  Subjective:   Carl Barnes is a 72 y.o. male who presents for Medicare Annual/Subsequent preventive examination.  Review of Systems      Cardiac Risk Factors include: advanced age (>39men, >1 women);hypertension;diabetes mellitus;male gender;sedentary lifestyle     Objective:    Today's Vitals   12/15/22 0831  Weight: 204 lb (92.5 kg)  Height: 5\' 9"  (1.753 m)   Body mass index is 30.13 kg/m.     12/15/2022    8:43 AM 10/30/2021    7:59 PM 10/30/2021   12:27 PM 10/29/2019   10:34 AM 10/18/2017    8:55 AM 07/06/2016    7:57 AM 06/08/2016   10:32 AM  Advanced Directives  Does Patient Have a Medical Advance Directive? No Yes No No No No No  Type of Furniture conservator/restorer;Living will       Does patient want to make changes to medical advance directive?  No - Patient declined       Copy of Healthcare Power of Attorney in Chart?  No - copy requested       Would patient like information on creating a medical advance directive? No - Patient declined No - Patient declined No - Patient declined No - Patient declined Yes (MAU/Ambulatory/Procedural Areas - Information given)  No - patient declined information    Current Medications (verified) Outpatient Encounter Medications as of 12/15/2022  Medication Sig   ACCU-CHEK AVIVA PLUS test strip CHECK SUGAR DAILY UP TO 3 TIMES DAILY. INSULIN TREATED DM2   acetaminophen (TYLENOL) 500 MG tablet Take 1,000 mg by mouth in the morning and at bedtime.   aspirin EC 81 MG tablet Take 1 tablet (81 mg total) by mouth daily.   B-D UF III MINI PEN NEEDLES 31G X 5 MM MISC  USE DAILY WITH INSULIN PEN   cetirizine (ZYRTEC) 10 MG tablet Take 10 mg by mouth daily as needed (for seasonal allergies with lawn mowing).    cyanocobalamin (VITAMIN B12) 1000 MCG/ML injection 1000 mcg injected monthly   gabapentin (NEURONTIN) 600 MG tablet Take 600 mg by mouth in the morning and at bedtime.   LANTUS SOLOSTAR 100 UNIT/ML Solostar Pen INJECT 32 UNITS UNDER THE SKIN ONCE DAILY   metFORMIN (GLUCOPHAGE) 500 MG tablet Take 1 tablet (500 mg total) by mouth at bedtime.   pravastatin (PRAVACHOL) 40 MG tablet TAKE 1 TABLET BY MOUTH EVERY NIGHT AT BEDTIME   SYRINGE-NEEDLE, DISP, 3 ML 25G X 1" 3 ML MISC Use with B12 injection   betamethasone dipropionate 0.05 % cream Apply 1 application. topically daily as needed (for psoriasis- apply to affected areas). (Patient not taking: Reported on 12/15/2022)   No facility-administered encounter medications on file as of 12/15/2022.    Allergies (verified) Chantix [varenicline], Codeine, and Metformin and related   History: Past Medical History:  Diagnosis Date   Cholangitis 12/11   [facility] admission - ERCP for stone removal per GI   Diabetes mellitus without complication (HCC)    Elevated glucose 10/16 to 06/09/2004   [facility] admission, mild elevated cholesterol   History of echocardiogram    a. 05/2017 Echo: EF 55-60%, mild LVH, no  rwma, mild MR.   Hyperlipidemia 12/21/1997   Morbid obesity (HCC)    Non-obstructive CAD (coronary artery disease)    a. 05/2016 Abnl St Test: intermediate risk, large, partially reversible inf/infsept defect consistent w/ ischemia/? scar;  b. 06/2016 Cath: LM nl, LAD 54m, RI nl, LCX nl, OM1/2/3 nl, RCA 40p/m, 30d, RPDA/RPAV nl.   Poisoning by black widow spider bite 1979   RBBB    Renal stones    Tobacco abuse    Past Surgical History:  Procedure Laterality Date   CARDIAC CATHETERIZATION Left 07/06/2016   Procedure: Left Heart Cath and Coronary Angiography;  Surgeon: Yvonne Kendall, MD;  Location: [facility] INVASIVE  CV LAB;  Service: Cardiovascular;  Laterality: Left;   Cardiolite  06/08/2004   WNL   CATARACT EXTRACTION W/ INTRAOCULAR LENS IMPLANT Right 10/13/2017   Dr. Burgess Estelle   CATARACT EXTRACTION W/ INTRAOCULAR LENS IMPLANT Left 09/08/2017   Dr.Tanner   Chest CT w/contrast  06/08/04   Negative PE   CHOLECYSTECTOMY  01/2011   Diseased GB.  [facility] MRI  12/99   Normal (R/O acoustic neuroma)   Family History  Problem Relation Age of Onset   Cancer Mother        Pancreatic   Hyperlipidemia Mother    CVA Father    Stroke Brother    Heart disease Brother    Heart disease Paternal Grandfather        MI   Diabetes Neg Hx    Alcohol abuse Neg Hx    Drug abuse Neg Hx    Depression Neg Hx    Colon cancer Neg Hx    Prostate cancer Neg Hx    Social History   Socioeconomic History   Marital status: Married    Spouse name: Not on file   Number of children: 1   Years of education: Not on file   Highest education level: Not on file  Occupational History   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  Tobacco Use   Smoking status: Every Day    Packs/day: 0.25    Years: 35.00    Additional pack years: 0.00    Total pack years: 8.75    Types: Cigarettes   Smokeless tobacco: Never  Vaping Use   Vaping Use: Former  Substance and Sexual Activity   Alcohol use: Yes    Alcohol/week: 2.0 standard drinks of alcohol    Types: 2 Shots of liquor per week    Comment: socially   Drug use: No   Sexual activity: Not on file  Other Topics Concern   Not on file  Social History Narrative   Married, 2nd marriage 1997   1 adult son   Retired Merchandiser, 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     [employer] fan   Social Determinants of Health   Financial Resource Strain: Low Risk  (12/15/2022)   Overall Financial Resource Strain (CARDIA)    Difficulty of Paying Living Expenses: Not hard at all   Food Insecurity: No Food Insecurity (12/15/2022)   Hunger Vital Sign    Worried About Running Out of Food in the Last Year: Never true    Ran Out of Food in the Last Year: Never true  Transportation Needs: No Transportation Needs (12/15/2022)   PRAPARE - Administrator, Civil Service (Medical): No  Lack of Transportation (Non-Medical): No  Physical Activity: Sufficiently Active (12/15/2022)   Exercise Vital Sign    Days of Exercise per Week: 5 days    Minutes of Exercise per Session: 60 min  Stress: No Stress Concern Present (12/15/2022)   [employer] of Occupational Health - Occupational Stress Questionnaire    Feeling of Stress : Not at all  Social Connections: Moderately Integrated (12/15/2022)   Social Connection and Isolation Panel [NHANES]    Frequency of Communication with Friends and Family: More than three times a week    Frequency of Social Gatherings with Friends and Family: More than three times a week    Attends Religious Services: More than 4 times per year    Active Member of [employer] or Organizations: No    Attends Engineer, structural: Never    Marital Status: Married    Tobacco Counseling Ready to quit: No Counseling given: Yes   Clinical Intake:  Pre-visit preparation completed: Yes  Pain : No/denies pain     Nutritional Risks: Nausea/ vomitting/ diarrhea (occasional loose stools) Diabetes: Yes CBG done?: No Did pt. bring in CBG monitor from home?: No  How often do you need to have someone help you when you read instructions, pamphlets, or other written materials from your doctor or pharmacy?: 1 - Never  Diabetic?Marland Kitchen  Interpreter Needed?: No  Information entered by :: C.Ichelle Harral LPN   Activities of Daily Living    12/15/2022    8:43 AM  In your present state of health, do you have any difficulty performing the following activities:  Hearing? 0  Vision? 0  Difficulty concentrating or making decisions? 1  Comment  occasionally forgets  Walking or climbing stairs? 0  Dressing or bathing? 0  Doing errands, shopping? 0  Preparing Food and eating ? N  Using the Toilet? N  In the past six months, have you accidently leaked urine? N  Do you have problems with loss of bowel control? N  Managing your Medications? N  Managing your Finances? N  Housekeeping or managing your Housekeeping? N    Patient Care Team: Joaquim Nam, MD as PCP - General (Family Medicine) End, Cristal Deer, MD as PCP - Cardiology (Cardiology) Janet Berlin, MD as Consulting Physician (Ophthalmology)  Indicate any recent Medical Services you may have received from other than [facility] providers in the past year (date may be approximate).     Assessment:   This is a routine wellness examination for Carl Barnes.  Hearing/Vision screen Hearing Screening - Comments:: No aids Vision Screening - Comments:: Readers - [facility]  Dietary issues and exercise activities discussed: Current Exercise Habits: Home exercise routine (works in the yard), Type of exercise: Other - see comments (works in yard), Time (Minutes): 60, Frequency (Times/Week): 5, Weekly Exercise (Minutes/Week): 300, Intensity: Mild, Exercise limited by: None identified   Goals Addressed   None   Depression Screen    12/15/2022    8:42 AM 10/05/2022   11:04 AM 08/06/2022    8:01 AM 11/30/2021   10:47 AM 10/29/2019   10:35 AM 10/27/2018    9:01 AM 10/18/2017    8:56 AM  PHQ 2/9 Scores  PHQ - 2 Score 0 0 0 0 0 0 0  PHQ- 9 Score  2   0  0    Fall Risk    12/15/2022    8:35 AM 10/05/2022   11:04 AM 08/06/2022    8:01 AM 11/30/2021   10:46 AM 10/16/2020  11:10 AM  Fall Risk   Falls in the past year? 0 0 0 0 0  Number falls in past yr: 0 0 0 0 0  Injury with Fall? 0 0 0 0 0  Risk for fall due to : No Fall Risks No Fall Risks No Fall Risks No Fall Risks   Follow up Falls prevention discussed;Falls evaluation completed Falls evaluation completed Falls  evaluation completed Falls evaluation completed Falls evaluation completed    FALL RISK PREVENTION PERTAINING TO THE HOME:  Any stairs in or around the home? Yes  If so, are there any without handrails? No  Home free of loose throw rugs in walkways, pet beds, electrical cords, etc? Yes  Adequate lighting in your home to reduce risk of falls? Yes   ASSISTIVE DEVICES UTILIZED TO PREVENT FALLS:  Life alert? No  Use of a cane, walker or w/c? No  Grab bars in the bathroom? Yes  Shower chair or bench in shower? No  Elevated toilet seat or a handicapped toilet? No   Cognitive Function:    10/29/2019   10:36 AM 10/18/2017    8:56 AM  MMSE - Mini Mental State Exam  Orientation to time 5 5  Orientation to Place 5 5  Registration 3 3  Attention/ Calculation 5 0  Recall 3 3  Language- name 2 objects  0  Language- repeat 1 1  Language- follow 3 step command  3  Language- read & follow direction  0  Write a sentence  0  Copy design  0  Total score  20        12/15/2022    8:45 AM  6CIT Screen  What Year? 0 points  What month? 0 points  What time? 0 points  Count back from 20 0 points  Months in reverse 0 points  Repeat phrase 0 points  Total Score 0 points    Immunizations Immunization History  Administered Date(s) Administered   Fluad Quad(high Dose 65+) 04/24/2019   Influenza, High Dose Seasonal PF 10/06/2021, 05/03/2022   Influenza,inj,Quad PF,6+ Mos 06/24/2017, 05/05/2018   Influenza-Unspecified 05/12/2016, 05/23/2020   PFIZER(Purple Top)SARS-COV-2 Vaccination 09/15/2019, 10/06/2019, 06/17/2020   Pneumococcal Conjugate-13 10/31/2017   Pneumococcal Polysaccharide-23 04/03/2019   Td 08/23/1996   Tdap 05/23/2018    TDAP status: Up to date  Flu Vaccine status: Up to date  Pneumococcal vaccine status: Up to date  Covid-19 vaccine status: Information provided on how to obtain vaccines.   Qualifies for Shingles Vaccine? Yes   Zostavax completed No   Shingrix  Completed?: No.    Education has been provided regarding the importance of this vaccine. Patient has been advised to call insurance company to determine out of pocket expense if they have not yet received this vaccine. Advised may also receive vaccine at local pharmacy or Health Dept. Verbalized acceptance and understanding.  Screening Tests Health Maintenance  Topic Date Due   Zoster Vaccines- Shingrix (1 of 2) Never done   COLONOSCOPY (Pts 45-78yrs Insurance coverage will need to be confirmed)  04/11/2022   COVID-19 Vaccine (4 - 2023-24 season) 04/23/2022   Diabetic kidney evaluation - Urine ACR  11/26/2022   FOOT EXAM  12/01/2022   INFLUENZA VACCINE  03/24/2023   HEMOGLOBIN A1C  04/05/2023   Diabetic kidney evaluation - eGFR measurement  08/26/2023   OPHTHALMOLOGY EXAM  10/21/2023   Medicare Annual Wellness (AWV)  12/15/2023   DTaP/Tdap/Td (3 - Td or Tdap) 05/23/2028   Pneumonia Vaccine 65+ Years  old  Completed   Hepatitis C Screening  Completed   HPV VACCINES  Aged Out    Health Maintenance  Health Maintenance Due  Topic Date Due   Zoster Vaccines- Shingrix (1 of 2) Never done   COLONOSCOPY (Pts 45-17yrs Insurance coverage will need to be confirmed)  04/11/2022   COVID-19 Vaccine (4 - 2023-24 season) 04/23/2022   Diabetic kidney evaluation - Urine ACR  11/26/2022   FOOT EXAM  12/01/2022    Colorectal cancer screening: Type of screening: Colonoscopy. Completed 04/11/12. Repeat every 3 years  Lung Cancer Screening: (Low Dose CT Chest recommended if Age 69-80 years, 30 pack-year currently smoking OR have quit w/in 15years.) does qualify.   Lung Cancer Screening Referral: will discuss with PCP  Additional Screening:  Hepatitis C Screening: does qualify; Completed 08/12/10  Vision Screening: Recommended annual ophthalmology exams for early detection of glaucoma and other disorders of the eye. Is the patient up to date with their annual eye exam?  Yes  Who is the provider or  what is the name of the office in which the patient attends annual eye exams? [facility] opthalmology If pt is not established with a provider, would they like to be referred to a provider to establish care? No .   Dental Screening: Recommended annual dental exams for proper oral hygiene  Community Resource Referral / Chronic Care Management: CRR required this visit?  No   CCM required this visit?  No      Plan:     I have personally reviewed and noted the following in the patient's chart:   Medical and social history Use of alcohol, tobacco or illicit drugs  Current medications and supplements including opioid prescriptions. Patient is not currently taking opioid prescriptions. Functional ability and status Nutritional status Physical activity Advanced directives List of other physicians Hospitalizations, surgeries, and ER visits in previous 12 months Vitals Screenings to include cognitive, depression, and falls Referrals and appointments  In addition, I have reviewed and discussed with patient certain preventive protocols, quality metrics, and best practice recommendations. A written personalized care plan for preventive services as well as general preventive health recommendations were provided to patient.     Maryan Puls, LPN   1/61/0960   Nurse Notes: Order for colonoscopy placed.

## 2022-12-23 ENCOUNTER — Encounter: Payer: Self-pay | Admitting: *Deleted

## 2022-12-26 ENCOUNTER — Other Ambulatory Visit: Payer: Self-pay | Admitting: Family Medicine

## 2022-12-26 DIAGNOSIS — E119 Type 2 diabetes mellitus without complications: Secondary | ICD-10-CM

## 2022-12-26 DIAGNOSIS — Z125 Encounter for screening for malignant neoplasm of prostate: Secondary | ICD-10-CM

## 2022-12-26 DIAGNOSIS — E538 Deficiency of other specified B group vitamins: Secondary | ICD-10-CM

## 2022-12-27 ENCOUNTER — Other Ambulatory Visit (INDEPENDENT_AMBULATORY_CARE_PROVIDER_SITE_OTHER): Payer: Medicare Other

## 2022-12-27 DIAGNOSIS — Z125 Encounter for screening for malignant neoplasm of prostate: Secondary | ICD-10-CM | POA: Diagnosis not present

## 2022-12-27 DIAGNOSIS — E538 Deficiency of other specified B group vitamins: Secondary | ICD-10-CM

## 2022-12-27 DIAGNOSIS — Z794 Long term (current) use of insulin: Secondary | ICD-10-CM | POA: Diagnosis not present

## 2022-12-27 DIAGNOSIS — E119 Type 2 diabetes mellitus without complications: Secondary | ICD-10-CM

## 2022-12-27 LAB — COMPREHENSIVE METABOLIC PANEL
ALT: 17 U/L (ref 0–53)
AST: 13 U/L (ref 0–37)
Albumin: 4 g/dL (ref 3.5–5.2)
Alkaline Phosphatase: 55 U/L (ref 39–117)
BUN: 13 mg/dL (ref 6–23)
CO2: 26 mEq/L (ref 19–32)
Calcium: 9.2 mg/dL (ref 8.4–10.5)
Chloride: 103 mEq/L (ref 96–112)
Creatinine, Ser: 0.98 mg/dL (ref 0.40–1.50)
GFR: 76.92 mL/min (ref >60.00)
Glucose, Bld: 233 mg/dL — ABNORMAL HIGH (ref 70–99)
Potassium: 4.7 mEq/L (ref 3.5–5.1)
Sodium: 137 mEq/L (ref 135–145)
Total Bilirubin: 0.5 mg/dL (ref 0.2–1.2)
Total Protein: 6.4 g/dL (ref 6.0–8.3)

## 2022-12-27 LAB — CBC WITH DIFFERENTIAL/PLATELET
Basophils Absolute: 0.1 10*3/uL (ref 0.0–0.1)
Basophils Relative: 1.1 % (ref 0.0–3.0)
Eosinophils Absolute: 0.4 10*3/uL (ref 0.0–0.7)
Eosinophils Relative: 4.8 % (ref 0.0–5.0)
HCT: 48.5 % (ref 39.0–52.0)
Hemoglobin: 16.7 g/dL (ref 13.0–17.0)
Lymphocytes Relative: 17.2 % (ref 12.0–46.0)
Lymphs Abs: 1.3 10*3/uL (ref 0.7–4.0)
MCHC: 34.4 g/dL (ref 30.0–36.0)
MCV: 95.1 fl (ref 78.0–100.0)
Monocytes Absolute: 0.7 10*3/uL (ref 0.1–1.0)
Monocytes Relative: 8.8 % (ref 3.0–12.0)
Neutro Abs: 5 10*3/uL (ref 1.4–7.7)
Neutrophils Relative %: 68.1 % (ref 43.0–77.0)
Platelets: 134 10*3/uL — ABNORMAL LOW (ref 150.0–400.0)
RBC: 5.1 Mil/uL (ref 4.22–5.81)
RDW: 13.4 % (ref 11.5–15.5)
WBC: 7.4 10*3/uL (ref 4.0–10.5)

## 2022-12-27 LAB — LDL CHOLESTEROL, DIRECT: Direct LDL: 66 mg/dL

## 2022-12-27 LAB — LIPID PANEL
Cholesterol: 124 mg/dL (ref 0–200)
HDL: 31.4 mg/dL — ABNORMAL LOW (ref >39.00)
NonHDL: 92.56
Total CHOL/HDL Ratio: 4
Triglycerides: 276 mg/dL — ABNORMAL HIGH (ref 0.0–149.0)
VLDL: 55.2 mg/dL — ABNORMAL HIGH (ref 0.0–40.0)

## 2022-12-27 LAB — PSA, MEDICARE: PSA: 0.36 ng/ml (ref 0.10–4.00)

## 2022-12-27 LAB — HEMOGLOBIN A1C: Hgb A1c MFr Bld: 9.1 % — ABNORMAL HIGH (ref 4.6–6.5)

## 2022-12-27 LAB — VITAMIN B12: Vitamin B-12: 229 pg/mL (ref 211–911)

## 2022-12-27 LAB — MICROALBUMIN / CREATININE URINE RATIO
Creatinine,U: 153.5 mg/dL
Microalb Creat Ratio: 0.6 mg/g (ref 0.0–30.0)
Microalb, Ur: 0.9 mg/dL (ref 0.0–1.9)

## 2022-12-30 ENCOUNTER — Other Ambulatory Visit: Payer: Self-pay | Admitting: Family Medicine

## 2023-01-03 ENCOUNTER — Ambulatory Visit (INDEPENDENT_AMBULATORY_CARE_PROVIDER_SITE_OTHER): Payer: Medicare Other | Admitting: Family Medicine

## 2023-01-03 ENCOUNTER — Encounter: Payer: Self-pay | Admitting: Family Medicine

## 2023-01-03 VITALS — BP 122/64 | HR 63 | Temp 97.6°F | Ht 69.0 in | Wt 205.0 lb

## 2023-01-03 DIAGNOSIS — R0683 Snoring: Secondary | ICD-10-CM

## 2023-01-03 DIAGNOSIS — E1169 Type 2 diabetes mellitus with other specified complication: Secondary | ICD-10-CM

## 2023-01-03 DIAGNOSIS — E785 Hyperlipidemia, unspecified: Secondary | ICD-10-CM

## 2023-01-03 DIAGNOSIS — E119 Type 2 diabetes mellitus without complications: Secondary | ICD-10-CM | POA: Diagnosis not present

## 2023-01-03 DIAGNOSIS — E538 Deficiency of other specified B group vitamins: Secondary | ICD-10-CM

## 2023-01-03 DIAGNOSIS — Z0000 Encounter for general adult medical examination without abnormal findings: Secondary | ICD-10-CM

## 2023-01-03 DIAGNOSIS — Z7189 Other specified counseling: Secondary | ICD-10-CM

## 2023-01-03 MED ORDER — METFORMIN HCL 500 MG PO TABS: 500.0000 mg | ORAL_TABLET | Freq: Every day | ORAL | 3 refills | Status: DC

## 2023-01-03 MED ORDER — LANTUS SOLOSTAR 100 UNIT/ML ~~LOC~~ SOPN: PEN_INJECTOR | SUBCUTANEOUS | Status: DC

## 2023-01-03 NOTE — Patient Instructions (Addendum)
Please call the GI clinic about follow up.   [facility] [phone]  I would go for sleep apnea evaluation, at least to talk to pulmonary.  Let me know if you need a referral.   Add 1 unit of insulin per day or two until your AM sugar is ~110.    It is [facility] about [facility] and agent orange exposure.    Recheck in about 3-4 months with A1c at the visit.    Take care.  Glad to see you.

## 2023-01-03 NOTE — Progress Notes (Unsigned)
Diabetes:  Using medications without difficulties: yes Hypoglycemic episodes:no Hyperglycemic episodes:no Feet problems: no Blood Sugars averaging: usually ~150-230.  He can't find sig triggers with diet.  D/w pt about OSA eval, if that contributes.   eye exam within last year: yes  B12 def. On monthly replacement.  Compliant.  Labs d/w pt.    Elevated Cholesterol: Using medications without problems: yes Muscle aches: some cramping with atypical work, ie after working on a ladder.  Likely not from statin.   Diet compliance: d/w pt.  Exercise: d/w pt.    Memory d/w pt. "Pretty good, for somebody my age I do great."  No red flag events.  We can follow clinically.  Discussed.  D/w pt about OSA eval.  Snoring noted.  Rationale for eval d/w pt, esp with DM2.  See AVS.    covid vaccine 2021.   Tetanus 2019 PNA up to date.  Flu 2023 shingrix d/w pt.   Smoking cessation d/w pt. A few cigs a day.   PSA wnl.  Pros and cons of testing d/w pt.   Colonoscopy 2013, d/w pt about follow up.   If incapacitated, would have his wife designated.   PMH and SH reviewed.   Vital signs, Meds and allergies reviewed. ROS: Per HPI unless specifically indicated in ROS section   GEN: nad, alert and oriented HEENT: ncat NECK: supple w/o LA CV: rrr.  PULM: ctab, no inc wob ABD: soft, +bs EXT: no edema SKIN: no acute rash  Diabetic foot exam: Normal inspection No skin breakdown No calluses  Normal DP pulses Normal sensation to light tough and monofilament Nails thickened.

## 2023-01-05 NOTE — Assessment & Plan Note (Signed)
I would go for sleep apnea evaluation, at least to talk to pulmonary.  He can let me know if he needs a referral.  Discussed with patient.  He can consider.

## 2023-01-05 NOTE — Assessment & Plan Note (Signed)
Discussed labs and options. Add 1 unit of insulin per day or two until AM sugar is ~110.    It is [facility] to [facility] with [facility] about diabetes and agent orange exposure.    Recheck in about 3-4 months with A1c at the visit.

## 2023-01-05 NOTE — Assessment & Plan Note (Signed)
B12 def. On monthly replacement.  Compliant.  Labs d/w pt. continue as is.

## 2023-01-05 NOTE — Assessment & Plan Note (Signed)
Continue pravastatin.  Continue work on diet and exercise. 

## 2023-01-05 NOTE — Assessment & Plan Note (Signed)
If incapacitated, would have his wife designated.  

## 2023-01-05 NOTE — Assessment & Plan Note (Signed)
covid vaccine 2021.   Tetanus 2019 PNA up to date.  Flu 2023 shingrix d/w pt.   Smoking cessation d/w pt. A few cigs a day.   PSA wnl.  Pros and cons of testing d/w pt.   Colonoscopy 2013, d/w pt about follow up.   If incapacitated, would have his wife designated.

## 2023-01-31 ENCOUNTER — Other Ambulatory Visit (HOSPITAL_COMMUNITY): Payer: Self-pay | Admitting: Orthopedic Surgery

## 2023-01-31 ENCOUNTER — Ambulatory Visit (HOSPITAL_COMMUNITY)
Admission: RE | Admit: 2023-01-31 | Discharge: 2023-01-31 | Disposition: A | Discharge status: Home / Self Care | Payer: Medicare Other | Source: Ambulatory Visit | Attending: Orthopedic Surgery | Admitting: Orthopedic Surgery

## 2023-01-31 DIAGNOSIS — M7989 Other specified soft tissue disorders: Secondary | ICD-10-CM

## 2023-01-31 DIAGNOSIS — M79604 Pain in right leg: Secondary | ICD-10-CM | POA: Diagnosis present

## 2023-01-31 NOTE — Progress Notes (Signed)
Right lower extremity venous duplex has been completed. Preliminary results can be found in CV Proc through chart review.  Results were given to [place] at Dr. Laverta [place] office.  01/31/23 3:45 PM Olen Cordial RVT

## 2023-02-23 ENCOUNTER — Other Ambulatory Visit: Payer: Self-pay | Admitting: Family Medicine

## 2023-02-23 ENCOUNTER — Ambulatory Visit: Payer: Medicare Other | Admitting: Family Medicine

## 2023-02-23 ENCOUNTER — Encounter: Payer: Self-pay | Admitting: Family Medicine

## 2023-02-23 VITALS — BP 134/70 | HR 53 | Temp 97.3°F | Ht 69.0 in | Wt 198.1 lb

## 2023-02-23 DIAGNOSIS — I2510 Atherosclerotic heart disease of native coronary artery without angina pectoris: Secondary | ICD-10-CM | POA: Diagnosis not present

## 2023-02-23 DIAGNOSIS — E1169 Type 2 diabetes mellitus with other specified complication: Secondary | ICD-10-CM | POA: Diagnosis not present

## 2023-02-23 DIAGNOSIS — F17200 Nicotine dependence, unspecified, uncomplicated: Secondary | ICD-10-CM | POA: Diagnosis not present

## 2023-02-23 DIAGNOSIS — I4510 Unspecified right bundle-branch block: Secondary | ICD-10-CM

## 2023-02-23 DIAGNOSIS — Z794 Long term (current) use of insulin: Secondary | ICD-10-CM

## 2023-02-23 DIAGNOSIS — R55 Syncope and collapse: Secondary | ICD-10-CM | POA: Diagnosis not present

## 2023-02-23 LAB — CBC WITH DIFFERENTIAL/PLATELET
Basophils Absolute: 0.1 10*3/uL (ref 0.0–0.1)
Basophils Relative: 1 % (ref 0.0–3.0)
Eosinophils Absolute: 0.5 10*3/uL (ref 0.0–0.7)
Eosinophils Relative: 5.8 % — ABNORMAL HIGH (ref 0.0–5.0)
HCT: 47.3 % (ref 39.0–52.0)
Hemoglobin: 15.9 g/dL (ref 13.0–17.0)
Lymphocytes Relative: 18.7 % (ref 12.0–46.0)
Lymphs Abs: 1.6 10*3/uL (ref 0.7–4.0)
MCHC: 33.5 g/dL (ref 30.0–36.0)
MCV: 96 fl (ref 78.0–100.0)
Monocytes Absolute: 0.6 10*3/uL (ref 0.1–1.0)
Monocytes Relative: 7.3 % (ref 3.0–12.0)
Neutro Abs: 5.6 10*3/uL (ref 1.4–7.7)
Neutrophils Relative %: 67.2 % (ref 43.0–77.0)
Platelets: 151 10*3/uL (ref 150.0–400.0)
RBC: 4.93 Mil/uL (ref 4.22–5.81)
RDW: 13.2 % (ref 11.5–15.5)
WBC: 8.3 10*3/uL (ref 4.0–10.5)

## 2023-02-23 LAB — COMPREHENSIVE METABOLIC PANEL
ALT: 22 U/L (ref 0–53)
AST: 16 U/L (ref 0–37)
Albumin: 4.2 g/dL (ref 3.5–5.2)
Alkaline Phosphatase: 60 U/L (ref 39–117)
BUN: 14 mg/dL (ref 6–23)
CO2: 29 mEq/L (ref 19–32)
Calcium: 9.8 mg/dL (ref 8.4–10.5)
Chloride: 102 mEq/L (ref 96–112)
Creatinine, Ser: 0.92 mg/dL (ref 0.40–1.50)
GFR: 82.88 mL/min (ref >60.00)
Glucose, Bld: 229 mg/dL — ABNORMAL HIGH (ref 70–99)
Potassium: 4.6 mEq/L (ref 3.5–5.1)
Sodium: 137 mEq/L (ref 135–145)
Total Bilirubin: 0.6 mg/dL (ref 0.2–1.2)
Total Protein: 6.7 g/dL (ref 6.0–8.3)

## 2023-02-23 LAB — TSH: TSH: 2.86 u[IU]/mL (ref 0.35–5.50)

## 2023-02-23 NOTE — Assessment & Plan Note (Addendum)
Encouraged full cessation. Precontemplative.

## 2023-02-23 NOTE — Patient Instructions (Addendum)
EKG and labs today  Stay off methocarbamol We will refer you back to Dr End cardiologist for further evaluation of passing out episodes.  No operating heavy machinery until evaluated by him.

## 2023-02-23 NOTE — Assessment & Plan Note (Signed)
Denies hypoglycemia contributing to syncope.  Continues lantus and metformin daily. Latest A1c 9.1%.  Notes home cbg's running 150-170s.

## 2023-02-23 NOTE — Assessment & Plan Note (Addendum)
Several episodes of syncope over the past 2 weeks, latest yesterday with fall and resultant abrasions to scalp of head. Not consistent with stroke or seizure. Notes BP and cbg's have not been low during these episodes. Discussed importance of avoiding dehydration in hot summer days.  Recommend against operating machinery/equipment at this time. 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  Update labwork including CBC, TSH, glu.  Non-focal neurological exam.  In cardiac history recommend expedited cardiac evaluation - will refer back to Dr End.  He will continue aspirin. He's not on antihypertensive. Orthostatics normal today. Chronically on gabapentin without change in dose.  He has stopped methocarbamol and meloxicam (latest new medicines).

## 2023-02-23 NOTE — Assessment & Plan Note (Addendum)
Chronic. 

## 2023-02-23 NOTE — Progress Notes (Addendum)
Ph: [phone] Fax: [phone]   Patient ID: Carl Barnes, male    DOB: 1951-07-29, 72 y.o.   MRN: [id number]  This visit was conducted in person.  BP 134/70   Pulse (!) 53   Temp (!) 97.3 F (36.3 C) (Temporal)   Ht 5\' 9"  (1.753 m)   Wt 198 lb 2 oz (89.9 kg)   SpO2 100%   BMI 29.26 kg/m   Orthostatic VS for the past 72 hrs (Last 3 readings):  Orthostatic BP Patient Position  02/23/23 1214 130/72 Standing  02/23/23 1210 132/70 Supine   BP Readings from Last 3 Encounters:  02/23/23 134/70  01/03/23 122/64  10/05/22 136/80   Pulse Readings from Last 3 Encounters:  02/23/23 (!) 53  01/03/23 63  10/05/22 83   CC: dizziness with syncope Subjective:   HPI: Carl Barnes is a 72 y.o. male presenting on 02/23/2023 for Dizziness (C/o dizzy spells. Started about 2 wks ago. Passed out- 02/14/23, 02/22/23. Recently started meloxicam and methocarbamol but stopped them when noticed possible side effect is dizziness. Pt accompanied by wife, Darlene. )   Patient of Dr Lianne Bushy new to me who  has a past medical history of Arthritis, Cholangitis (07/2010), Diabetes mellitus without complication (HCC), Elevated glucose (10/16 to 06/09/2004), GERD (gastroesophageal reflux disease), History of echocardiogram, Hyperlipidemia (12/21/1997), Non-obstructive CAD (coronary artery disease), Poisoning by black widow spider bite (1979), RBBB, Renal stones, and Tobacco abuse.   2 wk h/o falls, near falls, dizziness with passing out. 2 syncopal episodes - last week had episode of syncope in driveway while active after washing car and mowing lawn, yesterday passed out in laundry room - hit head on cabinet door.  Premonitory symptoms of nausea, dizziness, lightheaded, leg weakness. Occasional racing heart.  Symptoms always when active, upright, never when seated or supine.   Denies fevers/chills, chest pain, cough, dyspnea, palpitations, headache.  He is not on blood pressure medicines. He states BP  and sugar has been normal during these episodes.  Continued smoker 5-6/day (or more).   Diabetic - on lantus 38u daily, metformin 500mg  daily.  He takes gabapentin 600mg  BID for cervical neck pain. New numbness/tingling to both toes over last few months. No hand symptoms.  Lab Results  Component Value Date   HGBA1C 9.1 (H) 12/27/2022     3 wks ago pulled muscle in leg - he's been taking meloxicam and methocarbamol for the past 3 weeks.   Sees Dr End for h/o nonobstructive CAD. Latest stress test 11/2021 reviewed.      Relevant past medical, surgical, family and social history reviewed and updated as indicated. Interim medical history since our last visit reviewed. Allergies and medications reviewed and updated. Outpatient Medications Prior to Visit  Medication Sig Dispense Refill   ACCU-CHEK AVIVA PLUS test strip CHECK SUGAR DAILY UP TO 3 TIMES DAILY. INSULIN TREATED DM2 100 each 12   acetaminophen (TYLENOL) 500 MG tablet Take 1,000 mg by mouth in the morning and at bedtime.     aspirin EC 81 MG tablet Take 1 tablet (81 mg total) by mouth daily. 90 tablet 3   B-D UF III MINI PEN NEEDLES 31G X 5 MM MISC USE DAILY WITH INSULIN PEN 100 each 3   cetirizine (ZYRTEC) 10 MG tablet Take 10 mg by mouth daily as needed (for seasonal allergies with lawn mowing).      cyanocobalamin (VITAMIN B12) 1000 MCG/ML injection 1000 mcg injected monthly 4 mL 2   gabapentin (  NEURONTIN) 600 MG tablet Take 600 mg by mouth in the morning and at bedtime.     insulin glargine (LANTUS SOLOSTAR) 100 UNIT/ML Solostar Pen INJECT 38 UNITS UNDER THE SKIN ONCE DAILY     metFORMIN (GLUCOPHAGE) 500 MG tablet Take 1 tablet (500 mg total) by mouth at bedtime. 90 tablet 3   pravastatin (PRAVACHOL) 40 MG tablet TAKE ONE TABLET BY MOUTH EVERY NIGHT AT BEDTIME 90 tablet 3   SYRINGE-NEEDLE, DISP, 3 ML 25G X 1" 3 ML MISC Use with B12 injection 10 each 1   meloxicam (MOBIC) 7.5 MG tablet Take 1 tablet PO twice a day for 2 weeks and  then PRN (Patient not taking: Reported on 02/23/2023)     methocarbamol (ROBAXIN) 500 MG tablet TAKE ONE TABLET BY MOUTH EVERY SIX HOURS (Patient not taking: Reported on 02/23/2023)     betamethasone dipropionate 0.05 % cream Apply 1 application  topically daily as needed (for psoriasis- apply to affected areas).     No facility-administered medications prior to visit.     Per HPI unless specifically indicated in ROS section below Review of Systems  Objective:  BP 134/70   Pulse (!) 53   Temp (!) 97.3 F (36.3 C) (Temporal)   Ht 5\' 9"  (1.753 m)   Wt 198 lb 2 oz (89.9 kg)   SpO2 100%   BMI 29.26 kg/m   Wt Readings from Last 3 Encounters:  02/23/23 198 lb 2 oz (89.9 kg)  01/03/23 205 lb (93 kg)  12/15/22 204 lb (92.5 kg)      Physical Exam Vitals and nursing note reviewed.  Constitutional:      Appearance: Normal appearance. He is not ill-appearing.  HENT:     Head:      Comments: Abrasions to frontal and occipital head    Mouth/Throat:     Mouth: Mucous membranes are moist.     Pharynx: Oropharynx is clear. No oropharyngeal exudate or posterior oropharyngeal erythema.     Comments: Dentures in place Eyes:     Extraocular Movements: Extraocular movements intact.     Pupils: Pupils are equal, round, and reactive to light.  Neck:     Vascular: No carotid bruit.  Cardiovascular:     Rate and Rhythm: Normal rate and regular rhythm.     Pulses: Normal pulses.     Heart sounds: Normal heart sounds. No murmur heard. Pulmonary:     Effort: Pulmonary effort is normal. No respiratory distress.     Breath sounds: Normal breath sounds. No wheezing, rhonchi or rales.  Musculoskeletal:     Cervical back: Normal range of motion and neck supple. No rigidity.     Right lower leg: No edema.     Left lower leg: No edema.  Lymphadenopathy:     Cervical: No cervical adenopathy.  Skin:    General: Skin is warm and dry.     Findings: No rash.  Neurological:     General: No focal  deficit present.     Mental Status: He is alert and oriented to person, place, and time.     Cranial Nerves: Cranial nerves 2-12 are intact.     Sensory: Sensation is intact.     Motor: Motor function is intact.     Coordination: Coordination is intact. Romberg sign negative. Coordination normal. Finger-Nose-Finger Test normal.     Gait: Gait is intact. Gait normal.     Comments:  CN 2-12 intact FTN intact EOMI  No pronator  drifg  Psychiatric:        Mood and Affect: Mood normal.        Behavior: Behavior normal.       Results for orders placed or performed in visit on 12/27/22  Microalbumin / creatinine urine ratio  Result Value Ref Range   Microalb, Ur 0.9 0.0 - 1.9 mg/dL   Creatinine,U 161.0 mg/dL   Microalb Creat Ratio 0.6 0.0 - 30.0 mg/g  Hemoglobin A1c  Result Value Ref Range   Hgb A1c MFr Bld 9.1 (H) 4.6 - 6.5 %  PSA, Medicare  Result Value Ref Range   PSA 0.36 0.10 - 4.00 ng/ml  Vitamin B12  Result Value Ref Range   Vitamin B-12 229 211 - 911 pg/mL  Lipid panel  Result Value Ref Range   Cholesterol 124 0 - 200 mg/dL   Triglycerides 960.4 (H) 0.0 - 149.0 mg/dL   HDL 54.09 (L) >81.19 mg/dL   VLDL 14.7 (H) 0.0 - 82.9 mg/dL   Total CHOL/HDL Ratio 4    NonHDL 92.56   Comprehensive metabolic panel  Result Value Ref Range   Sodium 137 135 - 145 mEq/L   Potassium 4.7 3.5 - 5.1 mEq/L   Chloride 103 96 - 112 mEq/L   CO2 26 19 - 32 mEq/L   Glucose, Bld 233 (H) 70 - 99 mg/dL   BUN 13 6 - 23 mg/dL   Creatinine, Ser 5.62 0.40 - 1.50 mg/dL   Total Bilirubin 0.5 0.2 - 1.2 mg/dL   Alkaline Phosphatase 55 39 - 117 U/L   AST 13 0 - 37 U/L   ALT 17 0 - 53 U/L   Total Protein 6.4 6.0 - 8.3 g/dL   Albumin 4.0 3.5 - 5.2 g/dL   GFR 13.08 >65.78 mL/min   Calcium 9.2 8.4 - 10.5 mg/dL  CBC with Differential/Platelet  Result Value Ref Range   WBC 7.4 4.0 - 10.5 K/uL   RBC 5.10 4.22 - 5.81 Mil/uL   Hemoglobin 16.7 13.0 - 17.0 g/dL   HCT 46.9 62.9 - 52.8 %   MCV 95.1 78.0 -  100.0 fl   MCHC 34.4 30.0 - 36.0 g/dL   RDW 41.3 24.4 - 01.0 %   Platelets 134.0 (L) 150.0 - 400.0 K/uL   Neutrophils Relative % 68.1 43.0 - 77.0 %   Lymphocytes Relative 17.2 12.0 - 46.0 %   Monocytes Relative 8.8 3.0 - 12.0 %   Eosinophils Relative 4.8 0.0 - 5.0 %   Basophils Relative 1.1 0.0 - 3.0 %   Neutro Abs 5.0 1.4 - 7.7 K/uL   Lymphs Abs 1.3 0.7 - 4.0 K/uL   Monocytes Absolute 0.7 0.1 - 1.0 K/uL   Eosinophils Absolute 0.4 0.0 - 0.7 K/uL   Basophils Absolute 0.1 0.0 - 0.1 K/uL  LDL cholesterol, direct  Result Value Ref Range   Direct LDL 66.0 mg/dL   EKG - sinus bradycardia rate 50, RBBB with LAFB, overall unchanged from prior  Assessment & Plan:   Problem List Items Addressed This Visit     SMOKER    Encouraged full cessation. Precontemplative.       Syncope - Primary    Several episodes of syncope over the past 2 weeks, latest yesterday with fall and resultant abrasions to scalp of head. Not consistent with stroke or seizure. Notes BP and cbg's have not been low during these episodes. Discussed importance of avoiding dehydration in hot summer days.  Recommend against operating machinery/equipment at this  time. 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  Update labwork including CBC, TSH, glu.  Non-focal neurological exam.  In cardiac history recommend expedited cardiac evaluation - will refer back to Dr End.  He will continue aspirin. He's not on antihypertensive. Orthostatics normal today. Chronically on gabapentin without change in dose.  He has stopped methocarbamol and meloxicam (latest new medicines).       Relevant Orders   EKG 12-Lead (Completed)   TSH   Comprehensive metabolic panel   CBC with Differential/Platelet   Ambulatory referral to Cardiology   CAD in native artery   RBBB    Chronic       Type 2 diabetes mellitus with other specified complication (HCC)    Denies hypoglycemia contributing to  syncope.  Continues lantus and metformin daily. Latest A1c 9.1%.  Notes home cbg's running 150-170s.        No orders of the defined types were placed in this encounter.   Orders Placed This Encounter  Procedures   TSH   Comprehensive metabolic panel   CBC with Differential/Platelet   Ambulatory referral to Cardiology    Referral Priority:   Urgent    Referral Type:   Consultation    Referral Reason:   Specialty Services Required    Number of Visits Requested:   1   EKG 12-Lead    Patient Instructions  EKG and labs today  Stay off methocarbamol We will refer you back to Dr End cardiologist for further evaluation of passing out episodes.  No operating heavy machinery until evaluated by him.   Follow up plan: Return if symptoms worsen or fail to improve.  Eustaquio Boyden, MD

## 2023-02-25 ENCOUNTER — Other Ambulatory Visit
Admission: RE | Admit: 2023-02-25 | Discharge: 2023-02-25 | Disposition: A | Discharge status: Home / Self Care | Payer: Medicare Other | Attending: Medical | Admitting: Medical

## 2023-02-25 ENCOUNTER — Ambulatory Visit (INDEPENDENT_AMBULATORY_CARE_PROVIDER_SITE_OTHER): Payer: Medicare Other

## 2023-02-25 ENCOUNTER — Ambulatory Visit: Payer: Medicare Other

## 2023-02-25 ENCOUNTER — Encounter: Payer: Self-pay | Admitting: Medical

## 2023-02-25 ENCOUNTER — Ambulatory Visit: Payer: Medicare Other | Attending: Medical | Admitting: Medical

## 2023-02-25 ENCOUNTER — Ambulatory Visit: Payer: Medicare Other | Admitting: Family Medicine

## 2023-02-25 VITALS — BP 146/76 | HR 57 | Ht 71.0 in | Wt 200.2 lb

## 2023-02-25 DIAGNOSIS — I4510 Unspecified right bundle-branch block: Secondary | ICD-10-CM | POA: Diagnosis not present

## 2023-02-25 DIAGNOSIS — I2510 Atherosclerotic heart disease of native coronary artery without angina pectoris: Secondary | ICD-10-CM

## 2023-02-25 DIAGNOSIS — R55 Syncope and collapse: Secondary | ICD-10-CM

## 2023-02-25 DIAGNOSIS — E782 Mixed hyperlipidemia: Secondary | ICD-10-CM

## 2023-02-25 LAB — MAGNESIUM: Magnesium: 2.2 mg/dL (ref 1.7–2.4)

## 2023-02-25 NOTE — Progress Notes (Signed)
Cardiology Office Note:    Date:  02/25/2023   ID:  Carl, Barnes 10/12/1950, MRN [id number]  PCP:  Carl Nam, MD  [facility] Cardiologist:  Carl Kendall, MD  [facility] Electrophysiologist:  None   Referring MD: Carl Boyden, MD   Chief Complaint: 1 year follow-up  History of Present Illness:    Carl Barnes is a 72 y.o. male with a hx of nonobstructive CAD, HLD, DM2, tobacco use who presents for 1 year follow-up.    Patient had an abnormal stress test in October 2017 suggestive of inferior and inferoseptal ischemia.  Subsequent catheterization in early November 2017 revealed minimal nonobstructive CAD.  Heart monitor was ordered in 12/2019 for dizziness which showed NSR, no sustained arrhythmia or prolonged pause.   Patient was seen in April 2023 reporting dyspnea on exertion, and echo and Myoview Lexiscan were ordered.  Echo showed LVEF 60 to 65%, grade 2 diastolic dysfunction, normal RV function, mild MR.  Myoview Lexiscan showed no ischemia, fixed inferior wall defect, low to moderate risk scan.  The patient was last seen 12/2021 and was overall doing better from a cardiac perspective.   Today, the patient reports pre-syncope and syncope over the the last 3 weeks. He feels dizzy and lightheadedness before passing out. This last week he has been feeling constant lightheadedness. He denies any chest pain or shortness of breath.  No lower leg edema, palpitations, heart racing, recent fever or chills, nausea or vomiting.  He reports 2 falls. First one he was working on a tractor outside. When he stood up he passed out. Second time he was working outside and went inside and passed out all of sudden while standing. He does not remember pro-dromal symptoms.    Patient smokes 5-6 cigarettes a day.  He drinks 3 mixed drinks weekly. No drug use. H/o DM, 9.1 A1C.   EKG from 02/23/2023 noted to have a heart rate of 48.  EKG today shows sinus bradycardia with a heart  rate of 57, first-degree AV block.  Recent labs showed hemoglobin 15.9, WBC 8.3, potassium 4.6, sodium 137, normal TSH.   Past Medical History:  Diagnosis Date   Arthritis    Cholangitis 07/2010   [facility] admission - ERCP for stone removal per GI   Diabetes mellitus without complication (HCC)    Elevated glucose 10/16 to 06/09/2004   [facility] admission, mild elevated cholesterol   GERD (gastroesophageal reflux disease)    History of echocardiogram    a. 05/2017 Echo: EF 55-60%, mild LVH, no rwma, mild MR.   Hyperlipidemia 12/21/1997   Non-obstructive CAD (coronary artery disease)    a. 05/2016 Abnl St Test: intermediate risk, large, partially reversible inf/infsept defect consistent w/ ischemia/? scar;  b. 06/2016 Cath: LM nl, LAD 96m, RI nl, LCX nl, OM1/2/3 nl, RCA 40p/m, 30d, RPDA/RPAV nl.   Poisoning by black widow spider bite 1979   RBBB    Renal stones    Tobacco abuse     Past Surgical History:  Procedure Laterality Date   CARDIAC CATHETERIZATION Left 07/06/2016   Procedure: Left Heart Cath and Coronary Angiography;  Surgeon: Carl Kendall, MD;  Location: [facility] INVASIVE CV LAB;  Service: Cardiovascular;  Laterality: Left;   Cardiolite  06/08/2004   WNL   CATARACT EXTRACTION W/ INTRAOCULAR LENS IMPLANT Right 10/13/2017   Dr. Burgess Barnes   CATARACT EXTRACTION W/ INTRAOCULAR LENS IMPLANT Left 09/08/2017   Dr.Tanner   Chest CT w/contrast  06/08/2004   Negative  PE   CHOLECYSTECTOMY  01/2011   Diseased GB.  [facility]   EYE SURGERY     Head MRI  07/1998   Normal (R/O acoustic neuroma)    Current Medications: Current Meds  Medication Sig   acetaminophen (TYLENOL) 500 MG tablet Take 1,000 mg by mouth in the morning and at bedtime.   aspirin EC 81 MG tablet Take 1 tablet (81 mg total) by mouth daily.   cetirizine (ZYRTEC) 10 MG tablet Take 10 mg by mouth daily as needed (for seasonal allergies with lawn mowing).    cyanocobalamin (VITAMIN B12) 1000 MCG/ML injection 1000 mcg injected  monthly   gabapentin (NEURONTIN) 600 MG tablet Take 600 mg by mouth in the morning and at bedtime.   insulin glargine (LANTUS SOLOSTAR) 100 UNIT/ML Solostar Pen INJECT 38 UNITS UNDER THE SKIN ONCE DAILY   metFORMIN (GLUCOPHAGE) 500 MG tablet Take 1 tablet (500 mg total) by mouth at bedtime.   pravastatin (PRAVACHOL) 40 MG tablet TAKE ONE TABLET BY MOUTH EVERY NIGHT AT BEDTIME     Allergies:   Chantix [varenicline], Codeine, and Metformin and related   Social History   Socioeconomic History   Marital status: Married    Spouse name: Not on file   Number of children: 1   Years of education: Not on file   Highest education level: Not on file  Occupational History   Occupation: PLANT SUPERVISOR    Employer: UNEMPLOYED    Comment: Social worker  Tobacco Use   Smoking status: Every Day    Packs/day: 0.50    Years: 35.00    Additional pack years: 0.00    Total pack years: 17.50    Types: Cigarettes   Smokeless tobacco: Never   Tobacco comments:    Smokes 5 cigarettes daily; 02/25/2023.  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     [employer] fan   Enjoys hunting (black powder, bow, rifle) and fishing.     Social Determinants of Health   Financial Resource Strain: Low Risk  (12/15/2022)   Overall Financial Resource Strain (CARDIA)    Difficulty of Paying Living Expenses: Not hard at all  Food Insecurity: No Food Insecurity (12/15/2022)   Hunger Vital Sign    Worried About Running Out of Food in the Last Year: Never true    Ran Out of Food in the Last Year: Never true  Transportation Needs: No Transportation Needs  (12/15/2022)   PRAPARE - Administrator, Civil Service (Medical): No    Lack of Transportation (Non-Medical): No  Physical Activity: Sufficiently Active (12/15/2022)   Exercise Vital Sign    Days of Exercise per Week: 5 days    Minutes of Exercise per Session: 60 min  Stress: No Stress Concern Present (12/15/2022)   [employer] of Occupational Health - Occupational Stress Questionnaire    Feeling of Stress : Not at all  Social Connections: Moderately Integrated (12/15/2022)   Social Connection and Isolation Panel [NHANES]    Frequency of Communication with Friends and Family:  More than three times a week    Frequency of Social Gatherings with Friends and Family: More than three times a week    Attends Religious Services: More than 4 times per year    Active Member of [employer] or Organizations: No    Attends Engineer, structural: Never    Marital Status: Married     Family History: The patient's family history includes COPD in his father; CVA in his father; Cancer in his mother; Hearing loss in his father; Heart disease in his brother and paternal grandfather; Hyperlipidemia in his mother; Stroke in his brother and father. There is no history of Diabetes, Alcohol abuse, Drug abuse, Depression, Colon cancer, or Prostate cancer.  ROS:   Please see the history of present illness.     All other systems reviewed and are negative.  EKGs/Labs/Other Studies Reviewed:    The following studies were reviewed today:  Echo 12/10/2021 1. Left ventricular ejection fraction, by estimation, is 60 to 65%. The  left ventricle has normal function. The left ventricle has no regional  wall motion abnormalities. Left ventricular diastolic parameters are  consistent with Grade II diastolic  dysfunction (pseudonormalization). The average left ventricular global  longitudinal strain is -19.0 %. The global longitudinal strain is normal.   2. Right ventricular systolic function is normal.  The right ventricular  size is mildly enlarged.   3. The mitral valve is normal in structure. Mild mitral valve  regurgitation.   4. The aortic valve is tricuspid. Aortic valve regurgitation is not  visualized. Aortic valve sclerosis is present, with no evidence of aortic  valve stenosis.   5. The inferior vena cava is normal in size with greater than 50%  respiratory variability, suggesting right atrial pressure of 3 mmHg.   Myoview lexiscan 11/2021 Narrative & Impression  Exercise myocardial perfusion imaging study with no significant  ischemia Fixed inferior wall defect appreciated, unable to exclude prior MI Grossly normal wall motion,  EF estimated at 55% No EKG changes concerning for ischemia at peak stress or in recovery. Baseline EKG with intraventricular conduction delay, right bundle branch block CT attenuation correction images unavailable Target heart rate achieved, 85% of maximum predicted heart rate Low to moderate risk scan     Signed, Dossie Arbour, MD, Ph.D [facility]    Heart monitor 01/2020 The patient was monitored for 9 days, 14 hours. The predominant rhythm was sinus with an average rate of 71 bpm (range 46-111 bpm in sinus). There were rare PAC's and PVC's. Two atrial runs lasting up to 5 beats with a maximum rate of 176 bpm occurred. No sustained arrhythmia or prolonged pause was observed. Patient triggered events correspond primarily to sinus rhythm, though one event also contains an isolated PAC.   Predominantly sinus rhythm with rare PAC's and PVC's, as well as two brief atrial runs.  No significant arrhythmia identified.  LHC 2017 Conclusions: Mild to moderate, non-obstructive coronary artery disease, including 40% mid LAD stenosis and serial 30-40% proximal, mid, and distal RCA lesions. Normal left ventricular filling pressure.   Recommendations: Medical therapy to prevent progression of non-obstructive CAD.  Will add atorvastatin 20 mg daily. No  high-grade stenosis to explain syncope. Follow-up with Dr. Alvino Chapel as an outpatient.   Carl Kendall, MD [facility] Pager: [phone]  Myoview lexiscan 2017    Narrative & Impression  Abnormal, intermediate risk study. There is a large in size, moderate in severity, partially reversible inferior/inferoseptal defect consistent with ischmia and  possible element of scar. Left ventricular ejection is preserved with basal inferior and inferoseptal hypokinesis.    EKG:  EKG is ordered today.  The ekg ordered today demonstrates sinus bradycardia, 57 bpm, first-degree AV block, LAD, right bundle branch block, no significant ST or T wave changes  Recent Labs: 02/23/2023: ALT 22; BUN 14; Creatinine, Ser 0.92; Hemoglobin 15.9; Platelets 151.0; Potassium 4.6; Sodium 137; TSH 2.86  Recent Lipid Panel    Component Value Date/Time   CHOL 124 12/27/2022 0803   TRIG 276.0 (H) 12/27/2022 0803   HDL 31.40 (L) 12/27/2022 0803   CHOLHDL 4 12/27/2022 0803   VLDL 55.2 (H) 12/27/2022 0803   LDLCALC 45 11/25/2021 0931   LDLDIRECT 66.0 12/27/2022 0803    Physical Exam:    VS:  BP (!) 146/76 (BP Location: Left Arm, Patient Position: Sitting, Cuff Size: Normal)   Pulse (!) 57   Ht 5\' 11"  (1.803 m)   Wt 200 lb 4 oz (90.8 kg)   SpO2 98%   BMI 27.93 kg/m     Wt Readings from Last 3 Encounters:  02/25/23 200 lb 4 oz (90.8 kg)  02/23/23 198 lb 2 oz (89.9 kg)  01/03/23 205 lb (93 kg)     GEN:  Well nourished, well developed in no acute distress HEENT: Normal NECK: No JVD; No carotid bruits LYMPHATICS: No lymphadenopathy CARDIAC: RRR, no murmurs, rubs, gallops RESPIRATORY:  Clear to auscultation without rales, wheezing or rhonchi  ABDOMEN: Soft, non-tender, non-distended MUSCULOSKELETAL:  No edema; No deformity  SKIN: Warm and dry NEUROLOGIC:  Alert and oriented x 3 PSYCHIATRIC:  Normal affect   ASSESSMENT:    1. Syncope, unspecified syncope type   2. CAD in native artery   3. RBBB    4. Hyperlipidemia, mixed    PLAN:    In order of problems listed above:  Syncope/Pre-syncope Patient reports 3 weeks of intermittent presyncope with 2 episodes of syncope, one episode suspected orthostatic in nature.  He denies any chest pain, shortness of breath, palpitations prior to falls.  Most the time patient is able to get his balance prior to falling.  Blood pressure today 146/76, heart rate 57.  Orthostatics today are negative.  He is not on any blood pressure medications at baseline. EKG shows sinus bradycardia with first-degree AV block and right bundle branch block.  EKG from 2 days ago shows heart rate of 48.  Recent labs are overall reassuring.  I will check a magnesium level. I will order an echocardiogram, 30-day live heart monitor, and bilateral carotid ultrasounds.  Patient is also wondering about ENT referral for inner ear issues. Dehydration may be contributing to symptoms. Discussed salt intake, compression socks and abdominal binder.  Nonobstructive coronary artery disease Patient had a remote cath that showed nonobstructive CAD.  Myoview stress test in 2023 showed possible inferior scar without ischemia.  Continue aspirin and statin therapy.  Hyperlipidemia LDL 66.  Continue pravastatin 40 mg daily.  Disposition: Follow up in 2 month(s) with MD/APP    Signed, Yuto Cajuste David Stall, PA-C  02/25/2023 4:14 PM    [facility]

## 2023-02-25 NOTE — Patient Instructions (Addendum)
Medication Instructions:  No changes *If you need a refill on your cardiac medications before your next appointment, please call your pharmacy*   Lab Work: Your provider would like for you to have following labs drawn: Magnesium.   Please go to the [facility] entrance and check in at the front desk.  You do not need an appointment.  They are open from 7am-6 pm.   If you have labs (blood work) drawn today and your tests are completely normal, you will receive your results only by: MyChart Message (if you have MyChart) OR A paper copy in the mail If you have any lab test that is abnormal or we need to change your treatment, we will call you to review the results.   Testing/Procedures: Your physician has requested that you have a carotid duplex. This test is an ultrasound of the carotid arteries in your neck. It looks at blood flow through these arteries that supply the brain with blood.   Allow one hour for this exam.  There are no restrictions or special instructions.  This will take place at [address] ([facility]) [phone], [place] [address]  Your physician has requested that you have an echocardiogram. Echocardiography is a painless test that uses sound waves to create images of your heart. It provides your doctor with information about the size and shape of your heart and how well your heart's chambers and valves are working.   You may receive an ultrasound enhancing agent through an IV if needed to better visualize your heart during the echo. This procedure takes approximately one hour.  There are no restrictions for this procedure.  This will take place at [address] ([facility]) [address], [place] [address]    Follow-Up: At [facility], you and your health needs are our priority.  As part of our continuing mission to provide you with exceptional heart care, we have created designated Provider Care Teams.  These Care Teams  include your primary Cardiologist (physician) and Advanced Practice Providers (APPs -  Physician Assistants and Nurse Practitioners) who all work together to provide you with the care you need, when you need it.  We recommend signing up for the patient portal called "MyChart".  Sign up information is provided on this After Visit Summary.  MyChart is used to connect with patients for Virtual Visits (Telemedicine).  Patients are able to view lab/test results, encounter notes, upcoming appointments, etc.  Non-urgent messages can be sent to your provider as well.   To learn more about what you can do with MyChart, go to [email or web address].    Your next appointment:   8 week(s)  Provider:   You may see Yvonne Kendall, MD or one of the following Advanced Practice Providers on your designated Care Team:   Nicolasa Ducking, NP Eula Listen, PA-C Cadence Fransico Michael, PA-C Charlsie Quest, NP    Other Instructions ZIO AT Long term monitor-Live Telemetry  Your physician has requested you wear a ZIO patch monitor for 28 days.  This is a single patch monitor. [employer] supplies one patch monitor per enrollment. Additional  stickers are not available.  Please do not apply patch if you will be having a Nuclear Stress Test, Echocardiogram, Cardiac CT, MRI,  or Chest Xray during the period you would be wearing the monitor. The patch cannot be worn during  these tests. You cannot remove and re-apply the ZIO AT patch monitor.  Your ZIO patch monitor will be mailed  3 day USPS to your address on file. It may take 3-5 days to  receive your monitor after you have been enrolled.  Once you have received your monitor, please review the enclosed instructions. Your monitor has  already been registered assigning a specific monitor serial # to you.   Billing and Patient Assistance Program information  Meredeth Ide has been supplied with any insurance information on record for billing. [employer] offers a sliding scale Patient  Assistance Program for patients without insurance, or whose  insurance does not completely cover the cost of the ZIO patch monitor. You must apply for the  Patient Assistance Program to qualify for the discounted rate. To apply, call [employer] at [phone],  select option 4, select option 2 , ask to apply for the Patient Assistance Program, (you can request an  interpreter if needed). Ir[employer] will ask your household income and how many people are in your  household. Ir[employer] will quote your out-of-pocket cost based on this information. They will also be able  to set up a 12 month interest free payment plan if needed.  Applying the monitor   Shave hair from upper left chest.  Hold the abrader disc by orange tab. Rub the abrader in 40 strokes over left upper chest as indicated in  your monitor instructions.  Clean area with 4 enclosed alcohol pads. Use all pads to ensure the area is cleaned thoroughly. Let  dry.  Apply patch as indicated in monitor instructions. Patch will be placed under collarbone on left side of  chest with arrow pointing upward.  Rub patch adhesive wings for 2 minutes. Remove the white label marked "1". Remove the white label  marked "2". Rub patch adhesive wings for 2 additional minutes.  While looking in a mirror, press and release button in center of patch. A small green light will flash 3-4  times. This will be your only indicator that the monitor has been turned on.  Do not shower for the first 24 hours. You may shower after the first 24 hours.  Press the button if you feel a symptom. You will hear a small click. Record Date, Time and Symptom in  the Patient Log.   Starting the Gateway  In your kit there is a Audiological scientist box the size of a cellphone. This is Buyer, retail. It transmits all your  recorded data to [facility]. This box must always stay within 10 feet of you. Open the box and push the *  button. There will be a light that blinks orange and then green a  few times. When the light stops  blinking, the Gateway is connected to the ZIO patch. Call [employer] at [phone] to confirm your monitor is transmitting.  Returning your monitor  Remove your patch and place it inside the Gateway. In the lower half of the Gateway there is a white  bag with prepaid postage on it. Place Gateway in bag and seal. Mail package back to [place] as soon as  possible. Your physician should have your final report approximately 7 days after you have mailed back  your monitor. Call [facility] Customer Care at [phone] if you have questions regarding your ZIO AT  patch monitor. Call them immediately if you see an orange light blinking on your monitor.  If your monitor falls off in less than 4 days, contact our Monitor department at [phone]. If your  monitor becomes loose or falls off after 4 days call [employer] at [phone] for suggestions on  securing your monitor

## 2023-02-28 DIAGNOSIS — R55 Syncope and collapse: Secondary | ICD-10-CM

## 2023-03-05 ENCOUNTER — Emergency Department (HOSPITAL_COMMUNITY): Payer: Medicare Other

## 2023-03-05 ENCOUNTER — Other Ambulatory Visit: Payer: Self-pay

## 2023-03-05 ENCOUNTER — Encounter (HOSPITAL_COMMUNITY): Payer: Self-pay | Admitting: *Deleted

## 2023-03-05 ENCOUNTER — Inpatient Hospital Stay (HOSPITAL_COMMUNITY)
Admission: EM | Admit: 2023-03-05 | Discharge: 2023-03-08 | DRG: 244 | Disposition: A | Discharge status: Home / Self Care | Payer: Medicare Other | Attending: Cardiology | Admitting: Cardiology

## 2023-03-05 ENCOUNTER — Telehealth: Payer: Self-pay | Admitting: Student

## 2023-03-05 DIAGNOSIS — I2510 Atherosclerotic heart disease of native coronary artery without angina pectoris: Secondary | ICD-10-CM | POA: Diagnosis present

## 2023-03-05 DIAGNOSIS — Z9049 Acquired absence of other specified parts of digestive tract: Secondary | ICD-10-CM

## 2023-03-05 DIAGNOSIS — K219 Gastro-esophageal reflux disease without esophagitis: Secondary | ICD-10-CM | POA: Diagnosis present

## 2023-03-05 DIAGNOSIS — Z885 Allergy status to narcotic agent status: Secondary | ICD-10-CM

## 2023-03-05 DIAGNOSIS — Z8249 Family history of ischemic heart disease and other diseases of the circulatory system: Secondary | ICD-10-CM | POA: Diagnosis not present

## 2023-03-05 DIAGNOSIS — E1165 Type 2 diabetes mellitus with hyperglycemia: Secondary | ICD-10-CM | POA: Diagnosis present

## 2023-03-05 DIAGNOSIS — R55 Syncope and collapse: Secondary | ICD-10-CM | POA: Diagnosis present

## 2023-03-05 DIAGNOSIS — Z794 Long term (current) use of insulin: Secondary | ICD-10-CM

## 2023-03-05 DIAGNOSIS — Z9842 Cataract extraction status, left eye: Secondary | ICD-10-CM | POA: Diagnosis not present

## 2023-03-05 DIAGNOSIS — Z961 Presence of intraocular lens: Secondary | ICD-10-CM | POA: Diagnosis present

## 2023-03-05 DIAGNOSIS — I453 Trifascicular block: Secondary | ICD-10-CM | POA: Diagnosis present

## 2023-03-05 DIAGNOSIS — Z823 Family history of stroke: Secondary | ICD-10-CM | POA: Diagnosis not present

## 2023-03-05 DIAGNOSIS — I4510 Unspecified right bundle-branch block: Secondary | ICD-10-CM | POA: Diagnosis not present

## 2023-03-05 DIAGNOSIS — Z888 Allergy status to other drugs, medicaments and biological substances status: Secondary | ICD-10-CM | POA: Diagnosis not present

## 2023-03-05 DIAGNOSIS — Z7984 Long term (current) use of oral hypoglycemic drugs: Secondary | ICD-10-CM

## 2023-03-05 DIAGNOSIS — R001 Bradycardia, unspecified: Secondary | ICD-10-CM | POA: Diagnosis present

## 2023-03-05 DIAGNOSIS — Z9841 Cataract extraction status, right eye: Secondary | ICD-10-CM | POA: Diagnosis not present

## 2023-03-05 DIAGNOSIS — Z87442 Personal history of urinary calculi: Secondary | ICD-10-CM | POA: Diagnosis not present

## 2023-03-05 DIAGNOSIS — E118 Type 2 diabetes mellitus with unspecified complications: Secondary | ICD-10-CM | POA: Diagnosis not present

## 2023-03-05 DIAGNOSIS — Z79899 Other long term (current) drug therapy: Secondary | ICD-10-CM | POA: Diagnosis not present

## 2023-03-05 DIAGNOSIS — I459 Conduction disorder, unspecified: Secondary | ICD-10-CM | POA: Diagnosis present

## 2023-03-05 DIAGNOSIS — Z825 Family history of asthma and other chronic lower respiratory diseases: Secondary | ICD-10-CM | POA: Diagnosis not present

## 2023-03-05 DIAGNOSIS — Z822 Family history of deafness and hearing loss: Secondary | ICD-10-CM | POA: Diagnosis not present

## 2023-03-05 DIAGNOSIS — Z7982 Long term (current) use of aspirin: Secondary | ICD-10-CM | POA: Diagnosis not present

## 2023-03-05 DIAGNOSIS — E7800 Pure hypercholesterolemia, unspecified: Secondary | ICD-10-CM | POA: Diagnosis present

## 2023-03-05 DIAGNOSIS — I442 Atrioventricular block, complete: Principal | ICD-10-CM | POA: Diagnosis present

## 2023-03-05 DIAGNOSIS — F17210 Nicotine dependence, cigarettes, uncomplicated: Secondary | ICD-10-CM | POA: Diagnosis present

## 2023-03-05 DIAGNOSIS — I4430 Unspecified atrioventricular block: Secondary | ICD-10-CM

## 2023-03-05 HISTORY — DX: Syncope and collapse: R55

## 2023-03-05 LAB — BASIC METABOLIC PANEL
Anion gap: 10 (ref 5–15)
BUN: 9 mg/dL (ref 8–23)
CO2: 22 mmol/L (ref 22–32)
Calcium: 9.4 mg/dL (ref 8.9–10.3)
Chloride: 102 mmol/L (ref 98–111)
Creatinine, Ser: 0.91 mg/dL (ref 0.61–1.24)
GFR, Estimated: >60 mL/min (ref >60)
Glucose, Bld: 317 mg/dL — ABNORMAL HIGH (ref 70–99)
Potassium: 4.2 mmol/L (ref 3.5–5.1)
Sodium: 134 mmol/L — ABNORMAL LOW (ref 135–145)

## 2023-03-05 LAB — CBC
HCT: 48.3 % (ref 39.0–52.0)
Hemoglobin: 16.4 g/dL (ref 13.0–17.0)
MCH: 32.6 pg (ref 26.0–34.0)
MCHC: 34 g/dL (ref 30.0–36.0)
MCV: 96 fL (ref 80.0–100.0)
Platelets: 136 10*3/uL — ABNORMAL LOW (ref 150–400)
RBC: 5.03 MIL/uL (ref 4.22–5.81)
RDW: 12.3 % (ref 11.5–15.5)
WBC: 9.3 10*3/uL (ref 4.0–10.5)
nRBC: 0 % (ref 0.0–0.2)

## 2023-03-05 LAB — TROPONIN I (HIGH SENSITIVITY)
Troponin I (High Sensitivity): 12 ng/L (ref <18)
Troponin I (High Sensitivity): 15 ng/L (ref <18)

## 2023-03-05 LAB — GLUCOSE, CAPILLARY: Glucose-Capillary: 311 mg/dL — ABNORMAL HIGH (ref 70–99)

## 2023-03-05 LAB — TSH: TSH: 2.994 u[IU]/mL (ref 0.350–4.500)

## 2023-03-05 LAB — MAGNESIUM: Magnesium: 1.9 mg/dL (ref 1.7–2.4)

## 2023-03-05 MED ORDER — PRAVASTATIN SODIUM 40 MG PO TABS
40.0000 mg | ORAL_TABLET | Freq: Every day | ORAL | Status: DC
  Administered 2023-03-05 – 2023-03-07 (×3): 40 mg via ORAL
  Filled 2023-03-05 (×3): qty 1

## 2023-03-05 MED ORDER — ASPIRIN 81 MG PO TBEC
81.0000 mg | DELAYED_RELEASE_TABLET | Freq: Every day | ORAL | Status: DC
  Administered 2023-03-06 – 2023-03-08 (×3): 81 mg via ORAL
  Filled 2023-03-05 (×3): qty 1

## 2023-03-05 MED ORDER — NICOTINE 7 MG/24HR TD PT24
7.0000 mg | MEDICATED_PATCH | Freq: Every day | TRANSDERMAL | Status: DC
  Administered 2023-03-06 – 2023-03-08 (×3): 7 mg via TRANSDERMAL
  Filled 2023-03-05 (×3): qty 1

## 2023-03-05 MED ORDER — GABAPENTIN 300 MG PO CAPS
600.0000 mg | ORAL_CAPSULE | Freq: Two times a day (BID) | ORAL | Status: DC
  Administered 2023-03-05 – 2023-03-08 (×6): 600 mg via ORAL
  Filled 2023-03-05 (×6): qty 2

## 2023-03-05 MED ORDER — INSULIN GLARGINE-YFGN 100 UNIT/ML ~~LOC~~ SOLN
15.0000 [IU] | Freq: Every day | SUBCUTANEOUS | Status: DC
  Administered 2023-03-05: 15 [IU] via SUBCUTANEOUS
  Filled 2023-03-05 (×2): qty 0.15

## 2023-03-05 MED ORDER — INSULIN ASPART 100 UNIT/ML IJ SOLN
0.0000 [IU] | Freq: Three times a day (TID) | INTRAMUSCULAR | Status: DC
  Administered 2023-03-06: 5 [IU] via SUBCUTANEOUS
  Administered 2023-03-06: 3 [IU] via SUBCUTANEOUS
  Administered 2023-03-06: 7 [IU] via SUBCUTANEOUS
  Administered 2023-03-07: 3 [IU] via SUBCUTANEOUS
  Administered 2023-03-07: 2 [IU] via SUBCUTANEOUS
  Administered 2023-03-07: 1 [IU] via SUBCUTANEOUS
  Administered 2023-03-08: 5 [IU] via SUBCUTANEOUS

## 2023-03-05 MED ORDER — ACETAMINOPHEN 325 MG PO TABS
650.0000 mg | ORAL_TABLET | Freq: Four times a day (QID) | ORAL | Status: DC | PRN
  Administered 2023-03-07 – 2023-03-08 (×2): 650 mg via ORAL
  Filled 2023-03-05 (×2): qty 2

## 2023-03-05 MED ORDER — METHOCARBAMOL 500 MG PO TABS
500.0000 mg | ORAL_TABLET | Freq: Two times a day (BID) | ORAL | Status: DC
  Administered 2023-03-05 – 2023-03-08 (×5): 500 mg via ORAL
  Filled 2023-03-05 (×6): qty 1

## 2023-03-05 MED ORDER — NICOTINE POLACRILEX 2 MG MT GUM: 2.0000 mg | CHEWING_GUM | OROMUCOSAL | Status: DC | PRN

## 2023-03-05 MED ORDER — LORATADINE 10 MG PO TABS
10.0000 mg | ORAL_TABLET | Freq: Every day | ORAL | Status: DC
  Administered 2023-03-06 – 2023-03-08 (×3): 10 mg via ORAL
  Filled 2023-03-05 (×3): qty 1

## 2023-03-05 NOTE — ED Triage Notes (Signed)
The pt had a heart monitor placed  this past Monday  he has been having passing out spells for the past 2 weeks and one episode today   he gets dizzy nad nausea and he passes oout for a few seconds dizziness and  nausea last for 15 minutes following this past episode  he was told to come here and call a pa   the wife has the number to call  no pain

## 2023-03-05 NOTE — H&P (Signed)
Cardiology Admission History and Physical   Patient ID: GRYPHON MAUTE MRN: [id number]; DOB: 1951-01-06   Admission date: 03/05/2023  PCP:  Joaquim Nam, MD   [facility] Providers Cardiologist:  Yvonne Kendall, MD        Chief Complaint:  Dizziness  Patient Profile:   Carl Barnes is a 72 y.o. male with a significant past medical history of non-obstructive coronary artery disease, insulin-dependent diabetes, active cigarette use and hypercholesteremia who is being seen 03/05/2023 for the evaluation of transient ventricular asystole.  History of Present Illness:   [name] states at around 12:30 pm today while taking a shower he suddenly felt dizzy, weak, and felt like he was going to faint.  He sat down on the bench in his shower and his symptoms did eventually resolve within a few seconds.  When he got up to stand, his symptoms suddenly came back but did eventually resolve.  Patient denies fainting.  While taking a nap, he states he received a call from [facility] and was told that he had a 'pause'.  He was told to come to his local emergency department for further assessment. Patient was brought to our facility by his wife.  At this time, he is without any symptoms.  He denies shortness of breath, chest pain, palpitations, or swelling in his legs.  However, the week prior, he fainted while working outside with another event two weeks prior while in the laundry room.  He had an arrhythmia monitor performed for further evaluation of syncope.   Of note, per report, patient had ~11 sec pause of ventricular asystole reported.     Past Medical History:  Diagnosis Date   Arthritis    Cholangitis 07/2010   [facility] admission - ERCP for stone removal per GI   Diabetes mellitus without complication (HCC)    Elevated glucose 10/16 to 06/09/2004   [facility] admission, mild elevated cholesterol   GERD (gastroesophageal reflux disease)    History of echocardiogram    a. 05/2017 Echo:  EF 55-60%, mild LVH, no rwma, mild MR.   Hyperlipidemia 12/21/1997   Non-obstructive CAD (coronary artery disease)    a. 05/2016 Abnl St Test: intermediate risk, large, partially reversible inf/infsept defect consistent w/ ischemia/? scar;  b. 06/2016 Cath: LM nl, LAD 70m, RI nl, LCX nl, OM1/2/3 nl, RCA 40p/m, 30d, RPDA/RPAV nl.   Poisoning by black widow spider bite 1979   RBBB    Renal stones    Syncope    Tobacco abuse     Past Surgical History:  Procedure Laterality Date   CARDIAC CATHETERIZATION Left 07/06/2016   Procedure: Left Heart Cath and Coronary Angiography;  Surgeon: Yvonne Kendall, MD;  Location: [facility] INVASIVE CV LAB;  Service: Cardiovascular;  Laterality: Left;   Cardiolite  06/08/2004   WNL   CATARACT EXTRACTION W/ INTRAOCULAR LENS IMPLANT Right 10/13/2017   Dr. Burgess Estelle   CATARACT EXTRACTION W/ INTRAOCULAR LENS IMPLANT Left 09/08/2017   Dr.Tanner   Chest CT w/contrast  06/08/2004   Negative PE   CHOLECYSTECTOMY  01/2011   Diseased GB.  [facility]   EYE SURGERY     Head MRI  07/1998   Normal (R/O acoustic neuroma)     Medications Prior to Admission: Prior to Admission medications   Medication Sig Start Date End Date Taking? Authorizing Provider  acetaminophen (TYLENOL) 500 MG tablet Take 1,000 mg by mouth daily.   Yes [provider]  aspirin EC 81 MG tablet Take  1 tablet (81 mg total) by mouth daily. 07/12/18  Yes End, Cristal Deer, MD  cetirizine (ZYRTEC) 10 MG tablet Take 10 mg by mouth daily as needed (for seasonal allergies with lawn mowing).    Yes [provider]  cyanocobalamin (VITAMIN B12) 1000 MCG/ML injection 1000 mcg injected monthly 10/05/22  Yes Joaquim Nam, MD  gabapentin (NEURONTIN) 600 MG tablet Take 600 mg by mouth in the morning and at bedtime.   Yes [provider]  insulin glargine (LANTUS SOLOSTAR) 100 UNIT/ML Solostar Pen INJECT 38 UNITS UNDER THE SKIN ONCE DAILY 01/03/23  Yes Joaquim Nam, MD  meloxicam (MOBIC)  7.5 MG tablet Take 7.5 mg by mouth 2 (two) times daily. 02/25/23  Yes [provider]  metFORMIN (GLUCOPHAGE) 500 MG tablet Take 1 tablet (500 mg total) by mouth at bedtime. 01/03/23  Yes Joaquim Nam, MD  methocarbamol (ROBAXIN) 500 MG tablet Take 500 mg by mouth 2 (two) times daily.   Yes [provider]  pravastatin (PRAVACHOL) 40 MG tablet TAKE ONE TABLET BY MOUTH EVERY NIGHT AT BEDTIME 12/31/22  Yes Joaquim Nam, MD  ACCU-CHEK AVIVA PLUS test strip CHECK SUGAR DAILY UP TO 3 TIMES DAILY. INSULIN TREATED DM2 Patient not taking: Reported on 02/25/2023 12/12/18   Joaquim Nam, MD  B-D UF III MINI PEN NEEDLES 31G X 5 MM MISC USE DAILY WITH INSULIN PEN Patient not taking: Reported on 02/25/2023 06/15/22   Joaquim Nam, MD  SYRINGE-NEEDLE, DISP, 3 ML 25G X 1" 3 ML MISC Use with B12 injection Patient not taking: Reported on 02/25/2023 07/07/22   Joaquim Nam, MD     Allergies:    Allergies  Allergen Reactions   Chantix [Varenicline] Other (See Comments)    Intolerant    Codeine Other (See Comments)    GI upset   Metformin And Related Other (See Comments)    Max dose 500mg -1000mg  a day    Social History:   Social History   Socioeconomic History   Marital status: Married    Spouse name: Not on file   Number of children: 1   Years of education: Not on file   Highest education level: Not on file  Occupational History   Occupation: PLANT SUPERVISOR    Employer: UNEMPLOYED    Comment: Social worker  Tobacco Use   Smoking status: Every Day    Current packs/day: 0.50    Average packs/day: 0.5 packs/day for 35.0 years (17.5 ttl pk-yrs)    Types: Cigarettes   Smokeless tobacco: Never   Tobacco comments:    Smokes 5 cigarettes daily; 02/25/2023.  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     [employer] fan   Enjoys hunting (black powder, bow, rifle) and fishing.     Social Determinants of Health   Financial Resource Strain: Low Risk  (12/15/2022)   Overall Financial Resource Strain (CARDIA)    Difficulty of Paying Living Expenses: Not hard at all  Food Insecurity: No Food Insecurity (12/15/2022)   Hunger  Vital Sign    Worried About Programme researcher, broadcasting/film/video in the Last Year: Never true    Ran Out of Food in the Last Year: Never true  Transportation Needs: No Transportation Needs (12/15/2022)   PRAPARE - Administrator, Civil Service (Medical): No    Lack of Transportation (Non-Medical): No  Physical Activity: Sufficiently Active (12/15/2022)   Exercise Vital Sign    Days of Exercise per Week: 5 days    Minutes of Exercise per Session: 60 min  Stress: No Stress Concern Present (12/15/2022)   [employer] of Occupational Health - Occupational Stress Questionnaire    Feeling of Stress : Not at all  Social Connections: Moderately Integrated (12/15/2022)   Social Connection and Isolation Panel [NHANES]    Frequency of Communication with Friends and Family: More than three times a week    Frequency of Social Gatherings with Friends and Family: More than three times a week    Attends Religious Services: More than 4 times per year    Active Member of [employer] or Organizations: No    Attends Banker Meetings: Never    Marital Status: Married  Catering manager Violence: Not At Risk (12/15/2022)   Humiliation, Afraid, Rape, and Kick questionnaire    Fear of Current or Ex-Partner: No    Emotionally Abused: No    Physically Abused: No    Sexually Abused: No    Family History:   The patient's family history includes COPD in his father; CVA in his father; Cancer  in his mother; Hearing loss in his father; Heart disease in his brother and paternal grandfather; Hyperlipidemia in his mother; Stroke in his brother and father. There is no history of Diabetes, Alcohol abuse, Drug abuse, Depression, Colon cancer, or Prostate cancer.    ROS:  Please see the history of present illness.  All other ROS reviewed and negative.     Physical Exam/Data:   Vitals:   03/05/23 1815 03/05/23 1900 03/05/23 1915 03/05/23 1930  BP: 138/87 130/63 (!) 140/81 (!) 145/83  Pulse: (!) 53 62 61 63  Resp: 16 20 15 18   Temp:      TempSrc:      SpO2: 97% 99% 99% 99%  Weight:      Height:       No intake or output data in the 24 hours ending 03/05/23 2011    03/05/2023    4:00 PM 02/25/2023    3:20 PM 02/23/2023   12:04 PM  Last 3 Weights  Weight (lbs) 200 lb 2.8 oz 200 lb 4 oz 198 lb 2 oz  Weight (kg) 90.8 kg 90.833 kg 89.869 kg     Body mass index is 27.92 kg/m.  General:  Well nourished, well developed, in no acute distress HEENT: normal Neck: no JVD Vascular: No carotid bruits; Distal pulses 2+ bilaterally   Cardiac:  normal S1, S2; RRR; no murmur  Lungs:  clear to auscultation bilaterally, no wheezing, rhonchi or rales  Abd: soft, nontender, no hepatomegaly  Ext: no edema Musculoskeletal:  No deformities, BUE and BLE strength normal and equal Skin: warm and dry  Neuro:  CNs 2-12 intact, no focal abnormalities noted Psych:  Normal affect    EKG:  The ECG that was done  was personally reviewed and demonstrates: 03/05/23 (15:13) NSR; RBBB; LAFB; bifascicular block; LVH  Relevant CV Studies: # Echocardiogram 12/10/21: IMPRESSIONS   1. Left ventricular ejection fraction, by estimation, is 60 to 65%. The  left ventricle has normal function. The left ventricle has no regional  wall motion abnormalities. Left ventricular diastolic parameters are  consistent with Grade II diastolic  dysfunction (pseudonormalization). The average left ventricular global   longitudinal strain is -19.0 %. The global longitudinal strain is normal.   2. Right ventricular systolic function is normal. The right ventricular  size is mildly enlarged.   3. The mitral valve is normal in structure. Mild mitral valve  regurgitation.   4. The aortic valve is tricuspid. Aortic valve regurgitation is not  visualized. Aortic valve sclerosis is present, with no evidence of aortic  valve stenosis.   5. The inferior vena cava is normal in size with greater than 50%  respiratory variability, suggesting right atrial pressure of 3 mmHg.   # Coronary angiography 07/06/16 Conclusions: Mild to moderate, non-obstructive coronary artery disease, including 40% mid LAD stenosis and serial 30-40% proximal, mid, and distal RCA lesions. Normal left ventricular filling pressure.  Laboratory Data:  High Sensitivity Troponin:   Recent Labs  Lab 03/05/23 1622 03/05/23 1756  TROPONINIHS 12 15      Chemistry Recent Labs  Lab 03/05/23 1622 03/05/23 1756  NA 134*  --   K 4.2  --   CL 102  --   CO2 22  --   GLUCOSE 317*  --   BUN 9  --   CREATININE 0.91  --   CALCIUM 9.4  --   MG  --  1.9  GFRNONAA >60  --   ANIONGAP 10  --     No results for input(s): "PROT", "ALBUMIN", "AST", "ALT", "ALKPHOS", "BILITOT" in the last 168 hours. Lipids No results for input(s): "CHOL", "TRIG", "HDL", "LABVLDL", "LDLCALC", "CHOLHDL" in the last 168 hours. Hematology Recent Labs  Lab 03/05/23 1622  WBC 9.3  RBC 5.03  HGB 16.4  HCT 48.3  MCV 96.0  MCH 32.6  MCHC 34.0  RDW 12.3  PLT 136*   Thyroid No results for input(s): "TSH", "FREET4" in the last 168 hours. BNPNo results for input(s): "BNP", "PROBNP" in the last 168 hours.  DDimer No results for input(s): "DDIMER" in the last 168 hours.   Radiology/Studies:  DG Chest Port 1 View  Result Date: 03/05/2023 CLINICAL DATA:  Loss of consciousness EXAM: PORTABLE CHEST 1 VIEW COMPARISON:  06/24/2016 FINDINGS: Stable cardiomediastinal  silhouette. Bibasilar atelectasis or scarring. Otherwise no focal consolidation, pleural effusion, or pneumothorax. No displaced rib fractures. Electronic device projects over the left upper chest. IMPRESSION: No active disease. Electronically Signed   By: Minerva Fester M.D.   On: 03/05/2023 17:16     Assessment and Plan:   Ventricular asystole: Patient with reported ventricular asystole in the setting of bifascicular block on ECG likely due to age related idiopathic changes causing electrical conduction abnormality.  Patient is without any reversible causes.  He is currently stable in sinus rhythm and asymptomatic.  We discussed at length the need for pacemaker and patient is in agreement.  [employer] report has not been faxed. -- Admit for possible pacemaker this Monday. -- Check TSH. -- Arrange for an echocardiogram.   -- Avoid any chronotropic agents.    Insulin-dependent diabetes: Patient with known type II diabetes, only taking long-acting insulin and metformin at home. -- Hold metformin. -- Start glycemic control protocol.  Hypercholesteremia: --Continue pravastatin.    Active cigarette smoker: -- Initiate nicotine patch.    Risk Assessment/Risk Scores:          Code Status: Full Code  Severity of Illness: The appropriate patient status for this patient is INPATIENT. Inpatient status is judged to be reasonable and necessary in order to provide the required intensity of service to ensure the patient's safety. The patient's presenting symptoms, physical exam findings, and initial radiographic and laboratory data in the context of their chronic comorbidities is felt to place them at high risk for further clinical deterioration. Furthermore, it is not anticipated that the patient will be medically stable for discharge from the hospital within 2 midnights of admission.   * I certify that at the point of admission it is my clinical judgment that the patient will require inpatient  hospital care spanning beyond 2 midnights from the point of admission due to high intensity of service, high risk for further deterioration and high frequency of surveillance required.*   For questions or updates, please contact [facility] Please consult [email or web address] for contact info under     Signed, Judie Grieve, MD  03/05/2023 8:11 PM

## 2023-03-05 NOTE — Telephone Encounter (Signed)
   Received page from Answering Service about an abnormal EKG.  Called and spoke with [employer] rep.  Patient has had multiple episodes of pauses/ventricular systole secondary to complete heart block and high-grade second-degree AV block this afternoon around 12:45 PM.  Longest pause lasted 12 seconds.  Rep called and spoke with patient who reported dizziness and near syncope at this time.  She advised him to go to the Emergency Department which I agree with.  I called and spoke with the patient as well.  He still feels a little lightheaded as well as weak but denies any persistent dizziness or near syncope.  He is getting ready to come to the Emergency Department.  Advised him to come to [facility] where EP team is located.  Patient voiced understanding and agreed.  His daughter will drive him.  Corrin Parker, PA-C 03/05/2023 2:47 PM

## 2023-03-05 NOTE — Plan of Care (Signed)

## 2023-03-05 NOTE — ED Provider Notes (Signed)
[place] EMERGENCY DEPARTMENT AT [facility] Provider Note   CSN: [id number] Arrival date & time: 03/05/23  1516     History {Add pertinent medical, surgical, social history, OB history to HPI:1} Chief Complaint  Patient presents with   Loss of Consciousness    Carl Barnes is a 72 y.o. male.  He has a history of nonobstructive coronary disease, diabetes.  Over the last few weeks he has had some syncopal and near syncopal events.  He was seen by cardiology and had a Zio patch placed.  He had another event today while he was in the shower and he pushed the button.  He said he was washing up and felt acutely lightheaded, did not pass out completely.  Symptoms lasted about 6 to 10 minutes.  Ultimately improved and was able to get out of the shower.  Received a phone call from cardiology that he needed to come to the emergency department.  He said he still feels a little slow in his thinking.  No fevers or chills no chest pain or shortness of breath.  He is not on any blood thinners.  He said he stopped taking a muscle relaxant because dizziness was a side effect.  The history is provided by the patient.  Near Syncope This is a recurrent problem. The problem has been resolved. Pertinent negatives include no chest pain, no abdominal pain, no headaches and no shortness of breath. Exacerbated by: standing. The symptoms are relieved by position. He has tried rest for the symptoms. The treatment provided moderate relief.       Home Medications Prior to Admission medications   Medication Sig Start Date End Date Taking? Authorizing Provider  ACCU-CHEK AVIVA PLUS test strip CHECK SUGAR DAILY UP TO 3 TIMES DAILY. INSULIN TREATED DM2 Patient not taking: Reported on 02/25/2023 12/12/18   Joaquim Nam, MD  acetaminophen (TYLENOL) 500 MG tablet Take 1,000 mg by mouth in the morning and at bedtime.    [provider]  aspirin EC 81 MG tablet Take 1 tablet (81 mg total) by mouth  daily. 07/12/18   End, Cristal Deer, MD  B-D UF III MINI PEN NEEDLES 31G X 5 MM MISC USE DAILY WITH INSULIN PEN Patient not taking: Reported on 02/25/2023 06/15/22   Joaquim Nam, MD  cetirizine (ZYRTEC) 10 MG tablet Take 10 mg by mouth daily as needed (for seasonal allergies with lawn mowing).     [provider]  cyanocobalamin (VITAMIN B12) 1000 MCG/ML injection 1000 mcg injected monthly 10/05/22   Joaquim Nam, MD  gabapentin (NEURONTIN) 600 MG tablet Take 600 mg by mouth in the morning and at bedtime.    [provider]  insulin glargine (LANTUS SOLOSTAR) 100 UNIT/ML Solostar Pen INJECT 38 UNITS UNDER THE SKIN ONCE DAILY 01/03/23   Joaquim Nam, MD  metFORMIN (GLUCOPHAGE) 500 MG tablet Take 1 tablet (500 mg total) by mouth at bedtime. 01/03/23   Joaquim Nam, MD  pravastatin (PRAVACHOL) 40 MG tablet TAKE ONE TABLET BY MOUTH EVERY NIGHT AT BEDTIME 12/31/22   Joaquim Nam, MD  SYRINGE-NEEDLE, DISP, 3 ML 25G X 1" 3 ML MISC Use with B12 injection Patient not taking: Reported on 02/25/2023 07/07/22   Joaquim Nam, MD      Allergies    Chantix [varenicline], Codeine, and Metformin and related    Review of Systems   Review of Systems  Constitutional:  Negative for fever.  Eyes:  Negative for  visual disturbance.  Respiratory:  Negative for shortness of breath.   Cardiovascular:  Positive for near-syncope. Negative for chest pain.  Gastrointestinal:  Negative for abdominal pain.  Neurological:  Positive for dizziness and light-headedness. Negative for headaches.    Physical Exam Updated Vital Signs BP (!) 143/78 (BP Location: Right Arm)   Pulse 73   Temp 97.8 F (36.6 C) (Oral)   Resp 19   Ht 5\' 11"  (1.803 m)   Wt 90.8 kg   SpO2 96%   BMI 27.92 kg/m  Physical Exam Vitals and nursing note reviewed.  Constitutional:      General: He is not in acute distress.    Appearance: Normal appearance. He is well-developed.  HENT:     Head: Normocephalic  and atraumatic.  Eyes:     Conjunctiva/sclera: Conjunctivae normal.  Cardiovascular:     Rate and Rhythm: Normal rate and regular rhythm.     Heart sounds: No murmur heard. Pulmonary:     Effort: Pulmonary effort is normal. No respiratory distress.     Breath sounds: Normal breath sounds.  Abdominal:     Palpations: Abdomen is soft.     Tenderness: There is no abdominal tenderness. There is no guarding or rebound.  Musculoskeletal:        General: No deformity. Normal range of motion.     Cervical back: Neck supple.  Skin:    General: Skin is warm and dry.     Capillary Refill: Capillary refill takes less than 2 seconds.  Neurological:     General: No focal deficit present.     Mental Status: He is alert.     Motor: No weakness.     ED Results / Procedures / Treatments   Labs (all labs ordered are listed, but only abnormal results are displayed) Labs Reviewed  BASIC METABOLIC PANEL  CBC  TROPONIN I (HIGH SENSITIVITY)    EKG None  Radiology No results found.  Procedures Procedures  {Document cardiac monitor, telemetry assessment procedure when appropriate:1}  Medications Ordered in ED Medications - No data to display  ED Course/ Medical Decision Making/ A&P   {   Click here for ABCD2, HEART and other calculatorsREFRESH Note before signing :1}                          Medical Decision Making  This patient complains of ***; this involves an extensive number of treatment Options and is a complaint that carries with it a high risk of complications and morbidity. The differential includes ***  I ordered, reviewed and interpreted labs, which included *** I ordered medication *** and reviewed PMP when indicated. I ordered imaging studies which included *** and I independently    visualized and interpreted imaging which showed *** Additional history obtained from *** Previous records obtained and reviewed *** I consulted *** and discussed lab and imaging  findings and discussed disposition.  Cardiac monitoring reviewed, *** Social determinants considered, *** Critical Interventions: ***  After the interventions stated above, I reevaluated the patient and found *** Admission and further testing considered, ***   {Document critical care time when appropriate:1} {Document review of labs and clinical decision tools ie heart score, Chads2Vasc2 etc:1}  {Document your independent review of radiology images, and any outside records:1} {Document your discussion with family members, caretakers, and with consultants:1} {Document social determinants of health affecting pt's care:1} {Document your decision making why or why not admission, treatments were needed:1}  Final Clinical Impression(s) / ED Diagnoses Final diagnoses:  None    Rx / DC Orders ED Discharge Orders     None

## 2023-03-05 NOTE — ED Notes (Signed)
ED TO INPATIENT HANDOFF REPORT  ED Nurse Name and Phone #:  Jhaden Drotar [phone]  A Name/Age/Gender Carl Barnes 72 y.o. male Room/Bed: TRACC/TRACC  Code Status   Code Status: Prior  Home/SNF/Other Home Patient oriented to: self, place, time, and situation Is this baseline? Yes   Triage Complete: Triage complete  Chief Complaint Heart block [I45.9]  Triage Note The pt had a heart monitor placed  this past Monday  he has been having passing out spells for the past 2 weeks and one episode today   he gets dizzy nad nausea and he passes oout for a few seconds dizziness and  nausea last for 15 minutes following this past episode  he was told to come here and call a pa   the wife has the number to call  no pain   Allergies Allergies  Allergen Reactions   Chantix [Varenicline] Other (See Comments)    Intolerant    Codeine Other (See Comments)    GI upset   Metformin And Related Other (See Comments)    Max dose 500mg -1000mg  a day    Level of Care/Admitting Diagnosis ED Disposition     ED Disposition  Admit   Condition  --   Comment  Hospital Area: [facility] [[id number]]  Level of Care: Telemetry Cardiac [[id number]]  May admit patient to [facility] or [facility] if equivalent level of care is available:: Yes  Covid Evaluation: Asymptomatic - no recent exposure (last 10 days) testing not required  Diagnosis: Heart block [540981]  Admitting Physician: Arlester Marker [[id number]]  Attending Physician: Arlester Marker [[id number]]  Certification:: I certify this patient will need inpatient services for at least 2 midnights  Estimated Length of Stay: 3          B Medical/Surgery History Past Medical History:  Diagnosis Date   Arthritis    Cholangitis 07/2010   [facility] admission - ERCP for stone removal per GI   Diabetes mellitus without complication (HCC)    Elevated glucose 10/16 to 06/09/2004   [facility] admission, mild elevated cholesterol   GERD  (gastroesophageal reflux disease)    History of echocardiogram    a. 05/2017 Echo: EF 55-60%, mild LVH, no rwma, mild MR.   Hyperlipidemia 12/21/1997   Non-obstructive CAD (coronary artery disease)    a. 05/2016 Abnl St Test: intermediate risk, large, partially reversible inf/infsept defect consistent w/ ischemia/? scar;  b. 06/2016 Cath: LM nl, LAD 70m, RI nl, LCX nl, OM1/2/3 nl, RCA 40p/m, 30d, RPDA/RPAV nl.   Poisoning by black widow spider bite 1979   RBBB    Renal stones    Syncope    Tobacco abuse    Past Surgical History:  Procedure Laterality Date   CARDIAC CATHETERIZATION Left 07/06/2016   Procedure: Left Heart Cath and Coronary Angiography;  Surgeon: Yvonne Kendall, MD;  Location: [facility] INVASIVE CV LAB;  Service: Cardiovascular;  Laterality: Left;   Cardiolite  06/08/2004   WNL   CATARACT EXTRACTION W/ INTRAOCULAR LENS IMPLANT Right 10/13/2017   Dr. Burgess Estelle   CATARACT EXTRACTION W/ INTRAOCULAR LENS IMPLANT Left 09/08/2017   Dr.Tanner   Chest CT w/contrast  06/08/2004   Negative PE   CHOLECYSTECTOMY  01/2011   Diseased GB.  [facility]   EYE SURGERY     Head MRI  07/1998   Normal (R/O acoustic neuroma)     A IV Location/Drains/Wounds Patient Lines/Drains/Airways Status     Active Line/Drains/Airways  Name Placement date Placement time Site Days   Peripheral IV 03/05/23 20 G Anterior;Distal;Left;Upper Arm 03/05/23  1759  Arm  less than 1            Intake/Output Last 24 hours No intake or output data in the 24 hours ending 03/05/23 2047  Labs/Imaging Results for orders placed or performed during the hospital encounter of 03/05/23 (from the past 48 hour(s))  Basic metabolic panel     Status: Abnormal   Collection Time: 03/05/23  4:22 PM  Result Value Ref Range   Sodium 134 (L) 135 - 145 mmol/L   Potassium 4.2 3.5 - 5.1 mmol/L   Chloride 102 98 - 111 mmol/L   CO2 22 22 - 32 mmol/L   Glucose, Bld 317 (H) 70 - 99 mg/dL    Comment: Glucose reference range  applies only to samples taken after fasting for at least 8 hours.   BUN 9 8 - 23 mg/dL   Creatinine, Ser 1.61 0.61 - 1.24 mg/dL   Calcium 9.4 8.9 - 09.6 mg/dL   GFR, Estimated >04 >54 mL/min    Comment: (NOTE) Calculated using the CKD-EPI Creatinine Equation (2021)    Anion gap 10 5 - 15    Comment: Performed at [facility], [address], [place], [place] [address]  CBC     Status: Abnormal   Collection Time: 03/05/23  4:22 PM  Result Value Ref Range   WBC 9.3 4.0 - 10.5 K/uL   RBC 5.03 4.22 - 5.81 MIL/uL   Hemoglobin 16.4 13.0 - 17.0 g/dL   HCT 91.4 78.2 - 95.6 %   MCV 96.0 80.0 - 100.0 fL   MCH 32.6 26.0 - 34.0 pg   MCHC 34.0 30.0 - 36.0 g/dL   RDW 21.3 08.6 - 57.8 %   Platelets 136 (L) 150 - 400 K/uL    Comment: REPEATED TO VERIFY PLATELET COUNT CONFIRMED BY SMEAR    nRBC 0.0 0.0 - 0.2 %    Comment: Performed at [facility], [address] [place], [place] [address]  Troponin I (High Sensitivity)     Status: None   Collection Time: 03/05/23  4:22 PM  Result Value Ref Range   Troponin I (High Sensitivity) 12 <18 ng/L    Comment: (NOTE) Elevated high sensitivity troponin I (hsTnI) values and significant  changes across serial measurements may suggest ACS but many other  chronic and acute conditions are known to elevate hsTnI results.  Refer to the "Links" section for chest pain algorithms and additional  guidance. Performed at [facility], [address], [place], [place] [address]   Magnesium     Status: None   Collection Time: 03/05/23  5:56 PM  Result Value Ref Range   Magnesium 1.9 1.7 - 2.4 mg/dL    Comment: Performed at [facility], [address], [place], [place] [address]  Troponin I (High Sensitivity)     Status: None   Collection Time: 03/05/23  5:56 PM  Result Value Ref Range   Troponin I (High Sensitivity) 15 <18 ng/L    Comment: (NOTE) Elevated high sensitivity troponin I (hsTnI) values and significant  changes across  serial measurements may suggest ACS but many other  chronic and acute conditions are known to elevate hsTnI results.  Refer to the "Links" section for chest pain algorithms and additional  guidance. Performed at [facility], [address] [place], [place] [address]  DG Chest Port 1 View  Result Date: 03/05/2023 CLINICAL DATA:  Loss of consciousness EXAM: PORTABLE CHEST 1 VIEW COMPARISON:  06/24/2016 FINDINGS: Stable cardiomediastinal silhouette. Bibasilar atelectasis or scarring. Otherwise no focal consolidation, pleural effusion, or pneumothorax. No displaced rib fractures. Electronic device projects over the left upper chest. IMPRESSION: No active disease. Electronically Signed   By: Minerva Fester M.D.   On: 03/05/2023 17:16    Pending Labs [employer] (From admission, onward)     Start     Ordered   Signed and Held  TSH  Once,   R        Signed and Held   Signed and Armed forces training and education officer morning,   R        Signed and Held   Signed and Held  CBC  Tomorrow morning,   R        Signed and Held            Vitals/Pain Today's Vitals   03/05/23 2002 03/05/23 2016 03/05/23 2017 03/05/23 2045  BP:    120/66  Pulse:    60  Resp:    13  Temp:  (!) 97.3 F (36.3 C)    TempSrc:  Temporal    SpO2:    96%  Weight:   91.2 kg   Height:   5\' 11"  (1.803 m)   PainSc: 0-No pain       Isolation Precautions No active isolations  Medications Medications - No data to display  Mobility walks     Focused Assessments     R Recommendations: See Admitting Provider Note  Report given to:   Additional Notes:

## 2023-03-06 ENCOUNTER — Inpatient Hospital Stay (HOSPITAL_COMMUNITY): Payer: Medicare Other

## 2023-03-06 DIAGNOSIS — I4510 Unspecified right bundle-branch block: Secondary | ICD-10-CM | POA: Diagnosis not present

## 2023-03-06 DIAGNOSIS — I459 Conduction disorder, unspecified: Secondary | ICD-10-CM | POA: Diagnosis not present

## 2023-03-06 LAB — BASIC METABOLIC PANEL
Anion gap: 8 (ref 5–15)
BUN: 12 mg/dL (ref 8–23)
CO2: 23 mmol/L (ref 22–32)
Calcium: 8.9 mg/dL (ref 8.9–10.3)
Chloride: 104 mmol/L (ref 98–111)
Creatinine, Ser: 0.88 mg/dL (ref 0.61–1.24)
GFR, Estimated: >60 mL/min (ref >60)
Glucose, Bld: 302 mg/dL — ABNORMAL HIGH (ref 70–99)
Potassium: 3.9 mmol/L (ref 3.5–5.1)
Sodium: 135 mmol/L (ref 135–145)

## 2023-03-06 LAB — ECHOCARDIOGRAM COMPLETE
Area-P 1/2: 2.5 cm2
Height: 71 in
S' Lateral: 2.8 cm
Weight: 3200 oz

## 2023-03-06 LAB — CBC
HCT: 44.7 % (ref 39.0–52.0)
Hemoglobin: 15 g/dL (ref 13.0–17.0)
MCH: 31.4 pg (ref 26.0–34.0)
MCHC: 33.6 g/dL (ref 30.0–36.0)
MCV: 93.7 fL (ref 80.0–100.0)
Platelets: 116 10*3/uL — ABNORMAL LOW (ref 150–400)
RBC: 4.77 MIL/uL (ref 4.22–5.81)
RDW: 12.2 % (ref 11.5–15.5)
WBC: 8.3 10*3/uL (ref 4.0–10.5)
nRBC: 0 % (ref 0.0–0.2)

## 2023-03-06 LAB — GLUCOSE, CAPILLARY
Glucose-Capillary: 241 mg/dL — ABNORMAL HIGH (ref 70–99)
Glucose-Capillary: 299 mg/dL — ABNORMAL HIGH (ref 70–99)
Glucose-Capillary: 272 mg/dL — ABNORMAL HIGH (ref 70–99)
Glucose-Capillary: 340 mg/dL — ABNORMAL HIGH (ref 70–99)
Glucose-Capillary: 318 mg/dL — ABNORMAL HIGH (ref 70–99)

## 2023-03-06 LAB — SURGICAL PCR SCREEN
MRSA, PCR: NEGATIVE
Staphylococcus aureus: NEGATIVE

## 2023-03-06 MED ORDER — INSULIN GLARGINE-YFGN 100 UNIT/ML ~~LOC~~ SOLN
20.0000 [IU] | Freq: Every day | SUBCUTANEOUS | Status: DC
  Administered 2023-03-06 – 2023-03-07 (×2): 20 [IU] via SUBCUTANEOUS
  Filled 2023-03-06 (×3): qty 0.2

## 2023-03-06 NOTE — Plan of Care (Signed)
Problem: Education: Goal: Ability to describe self-care measures that may prevent or decrease complications (Diabetes Survival Skills Education) will improve 03/06/2023 2028 by Royetta Crochet, RN Outcome: Progressing 03/06/2023 2026 by Royetta Crochet, RN Outcome: Progressing Goal: Individualized Educational Video(s) 03/06/2023 2028 by Royetta Crochet, RN Outcome: Progressing 03/06/2023 2026 by Royetta Crochet, RN Outcome: Progressing   Problem: Coping: Goal: Ability to adjust to condition or change in health will improve 03/06/2023 2028 by Royetta Crochet, RN Outcome: Progressing 03/06/2023 2026 by Royetta Crochet, RN Outcome: Progressing   Problem: Fluid Volume: Goal: Ability to maintain a balanced intake and output will improve 03/06/2023 2028 by Royetta Crochet, RN Outcome: Progressing 03/06/2023 2026 by Royetta Crochet, RN Outcome: Progressing   Problem: Health Behavior/Discharge Planning: Goal: Ability to identify and utilize available resources and services will improve 03/06/2023 2028 by Royetta Crochet, RN Outcome: Progressing 03/06/2023 2026 by Royetta Crochet, RN Outcome: Progressing Goal: Ability to manage health-related needs will improve 03/06/2023 2028 by Royetta Crochet, RN Outcome: Progressing 03/06/2023 2026 by Royetta Crochet, RN Outcome: Progressing   Problem: Metabolic: Goal: Ability to maintain appropriate glucose levels will improve 03/06/2023 2028 by Royetta Crochet, RN Outcome: Progressing 03/06/2023 2026 by Royetta Crochet, RN Outcome: Progressing   Problem: Nutritional: Goal: Maintenance of adequate nutrition will improve 03/06/2023 2028 by Royetta Crochet, RN Outcome: Progressing 03/06/2023 2026 by Royetta Crochet, RN Outcome: Progressing Goal: Progress toward achieving an optimal weight will improve 03/06/2023 2028 by Royetta Crochet, RN Outcome: Progressing 03/06/2023 2026 by Royetta Crochet, RN Outcome: Progressing   Problem:  Skin Integrity: Goal: Risk for impaired skin integrity will decrease 03/06/2023 2028 by Royetta Crochet, RN Outcome: Progressing 03/06/2023 2026 by Royetta Crochet, RN Outcome: Progressing   Problem: Tissue Perfusion: Goal: Adequacy of tissue perfusion will improve 03/06/2023 2028 by Royetta Crochet, RN Outcome: Progressing 03/06/2023 2026 by Royetta Crochet, RN Outcome: Progressing   Problem: Education: Goal: Knowledge of General Education information will improve Description: Including pain rating scale, medication(s)/side effects and non-pharmacologic comfort measures 03/06/2023 2028 by Royetta Crochet, RN Outcome: Progressing 03/06/2023 2026 by Royetta Crochet, RN Outcome: Progressing   Problem: Health Behavior/Discharge Planning: Goal: Ability to manage health-related needs will improve 03/06/2023 2028 by Royetta Crochet, RN Outcome: Progressing 03/06/2023 2026 by Royetta Crochet, RN Outcome: Progressing   Problem: Clinical Measurements: Goal: Ability to maintain clinical measurements within normal limits will improve 03/06/2023 2028 by Royetta Crochet, RN Outcome: Progressing 03/06/2023 2026 by Royetta Crochet, RN Outcome: Progressing Goal: Will remain free from infection 03/06/2023 2028 by Royetta Crochet, RN Outcome: Progressing 03/06/2023 2026 by Royetta Crochet, RN Outcome: Progressing Goal: Diagnostic test results will improve 03/06/2023 2028 by Royetta Crochet, RN Outcome: Progressing 03/06/2023 2026 by Royetta Crochet, RN Outcome: Progressing Goal: Respiratory complications will improve 03/06/2023 2028 by Royetta Crochet, RN Outcome: Progressing 03/06/2023 2026 by Royetta Crochet, RN Outcome: Progressing Goal: Cardiovascular complication will be avoided 03/06/2023 2028 by Royetta Crochet, RN Outcome: Progressing 03/06/2023 2026 by Royetta Crochet, RN Outcome: Progressing   Problem: Activity: Goal: Risk for activity intolerance will  decrease 03/06/2023 2028 by Royetta Crochet, RN Outcome: Progressing 03/06/2023 2026 by Royetta Crochet, RN Outcome: Progressing   Problem: Nutrition: Goal: Adequate nutrition will be maintained 03/06/2023 2028 by Royetta Crochet, RN Outcome: Progressing 03/06/2023 2026 by Royetta Crochet, RN Outcome: Progressing  Problem: Coping: Goal: Level of anxiety will decrease 03/06/2023 2028 by Royetta Crochet, RN Outcome: Progressing 03/06/2023 2026 by Royetta Crochet, RN Outcome: Progressing   Problem: Elimination: Goal: Will not experience complications related to bowel motility 03/06/2023 2028 by Royetta Crochet, RN Outcome: Progressing 03/06/2023 2026 by Royetta Crochet, RN Outcome: Progressing Goal: Will not experience complications related to urinary retention 03/06/2023 2028 by Royetta Crochet, RN Outcome: Progressing 03/06/2023 2026 by Royetta Crochet, RN Outcome: Progressing   Problem: Pain Managment: Goal: General experience of comfort will improve 03/06/2023 2028 by Royetta Crochet, RN Outcome: Progressing 03/06/2023 2026 by Royetta Crochet, RN Outcome: Progressing   Problem: Safety: Goal: Ability to remain free from injury will improve 03/06/2023 2028 by Royetta Crochet, RN Outcome: Progressing 03/06/2023 2026 by Royetta Crochet, RN Outcome: Progressing   Problem: Skin Integrity: Goal: Risk for impaired skin integrity will decrease 03/06/2023 2028 by Royetta Crochet, RN Outcome: Progressing 03/06/2023 2026 by Royetta Crochet, RN Outcome: Progressing   Problem: Education: Goal: Knowledge of cardiac device and self-care will improve 03/06/2023 2028 by Royetta Crochet, RN Outcome: Progressing 03/06/2023 2026 by Royetta Crochet, RN Outcome: Progressing Goal: Ability to safely manage health related needs after discharge will improve 03/06/2023 2028 by Royetta Crochet, RN Outcome: Progressing 03/06/2023 2026 by Royetta Crochet, RN Outcome:  Progressing Goal: Individualized Educational Video(s) 03/06/2023 2028 by Royetta Crochet, RN Outcome: Progressing 03/06/2023 2026 by Royetta Crochet, RN Outcome: Progressing   Problem: Cardiac: Goal: Ability to achieve and maintain adequate cardiopulmonary perfusion will improve 03/06/2023 2028 by Royetta Crochet, RN Outcome: Progressing 03/06/2023 2026 by Royetta Crochet, RN Outcome: Progressing

## 2023-03-06 NOTE — Plan of Care (Signed)
  Problem: Education: Goal: Ability to describe self-care measures that may prevent or decrease complications (Diabetes Survival Skills Education) will improve Outcome: Progressing Goal: Individualized Educational Video(s) Outcome: Progressing   Problem: Coping: Goal: Ability to adjust to condition or change in health will improve Outcome: Progressing   Problem: Fluid Volume: Goal: Ability to maintain a balanced intake and output will improve Outcome: Progressing   Problem: Health Behavior/Discharge Planning: Goal: Ability to identify and utilize available resources and services will improve Outcome: Progressing Goal: Ability to manage health-related needs will improve Outcome: Progressing   Problem: Metabolic: Goal: Ability to maintain appropriate glucose levels will improve Outcome: Progressing   Problem: Nutritional: Goal: Maintenance of adequate nutrition will improve Outcome: Progressing Goal: Progress toward achieving an optimal weight will improve Outcome: Progressing   Problem: Skin Integrity: Goal: Risk for impaired skin integrity will decrease Outcome: Progressing   Problem: Tissue Perfusion: Goal: Adequacy of tissue perfusion will improve Outcome: Progressing   Problem: Education: Goal: Knowledge of General Education information will improve Description: Including pain rating scale, medication(s)/side effects and non-pharmacologic comfort measures Outcome: Progressing   Problem: Health Behavior/Discharge Planning: Goal: Ability to manage health-related needs will improve Outcome: Progressing   Problem: Clinical Measurements: Goal: Ability to maintain clinical measurements within normal limits will improve Outcome: Progressing Goal: Will remain free from infection Outcome: Progressing Goal: Diagnostic test results will improve Outcome: Progressing Goal: Respiratory complications will improve Outcome: Progressing Goal: Cardiovascular complication will  be avoided Outcome: Progressing   Problem: Activity: Goal: Risk for activity intolerance will decrease Outcome: Progressing   Problem: Nutrition: Goal: Adequate nutrition will be maintained Outcome: Progressing   Problem: Coping: Goal: Level of anxiety will decrease Outcome: Progressing   Problem: Elimination: Goal: Will not experience complications related to bowel motility Outcome: Progressing Goal: Will not experience complications related to urinary retention Outcome: Progressing   Problem: Pain Managment: Goal: General experience of comfort will improve Outcome: Progressing   Problem: Safety: Goal: Ability to remain free from injury will improve Outcome: Progressing   Problem: Skin Integrity: Goal: Risk for impaired skin integrity will decrease Outcome: Progressing   Problem: Education: Goal: Knowledge of cardiac device and self-care will improve Outcome: Progressing Goal: Ability to safely manage health related needs after discharge will improve Outcome: Progressing Goal: Individualized Educational Video(s) Outcome: Progressing   Problem: Cardiac: Goal: Ability to achieve and maintain adequate cardiopulmonary perfusion will improve Outcome: Progressing   

## 2023-03-06 NOTE — Consult Note (Signed)
   Electrophysiology Consultation   Patient ID: ISCO DAMBRA MRN: [id number]; DOB: 1951/08/21  Admit date: 03/05/2023 Date of Consult: 03/06/2023  PCP:  Joaquim Nam, MD   [facility] Providers Cardiologist:  Yvonne Kendall, MD   {   History of Present Illness:   Mr. Uppal is a pleasant 71yo man who I am seeing today for an evaluation of ventricular standstill and syncope at the request of Dr Orson Aloe. He has a hx of DM, CAD, HLD and tobacco use. He presented after a near syncopal episode while in the shower yesteday. Heart monitor shows a prolonged ventricular standstill during that time. He had the event monitor placed originally for an evaluation of syncope.  Saw Cadence 02/25/2023 and reported syncope/presyncope over 3 week period. Mostly occur when he is active.     Past medical, surgical, family and social history reviewed.      ROS:  Please see the history of present illness.  All other ROS reviewed and negative.     Physical Exam/Data:   Vitals:   03/06/23 0329 03/06/23 0354 03/06/23 0400 03/06/23 0734  BP:  114/75  124/69  Pulse: 66 (!) 58  (!) 55  Resp: 17 15  15   Temp:  97.8 F (36.6 C)  98 F (36.7 C)  TempSrc:  Oral  Oral  SpO2: 94% 97% 97% 96%  Weight:      Height:        Intake/Output Summary (Last 24 hours) at 03/06/2023 0948 Last data filed at 03/06/2023 0427 Gross per 24 hour  Intake --  Output 800 ml  Net -800 ml      03/05/2023    9:18 PM 03/05/2023    8:17 PM 03/05/2023    4:00 PM  Last 3 Weights  Weight (lbs) 200 lb 201 lb 200 lb 2.8 oz  Weight (kg) 90.719 kg 91.173 kg 90.8 kg     Body mass index is 27.89 kg/m.   General:  Well nourished, well developed, in no acute distress Cardiac:  normal S1, S2; RRR; no murmur  Lungs:  clear to auscultation bilaterally, no wheezing, rhonchi or rales  Psych:  Normal affect   EKG:  The EKG was personally reviewed and demonstrates:  RBBB, first degree av delay, LAFB  Telemetry:   Telemetry was personally reviewed and demonstrates:  sinus with BBB  Relevant CV Studies:  Zio personally reviewed.  Sinus with BBB and then periods of CHB. There are periods of prolonged ventricular standstill that correspond to synopal/presyncopal episodes. The atrial activity appears to be sinus tach/atrial tach during these episodes. Possibly slow AFL.   Assessment and Plan:   #Symptomatic Bradycardia #Ventricular standstill #CHB Patient with recurrent symptomatic bradycardia episodes corresponding to CHB with prolonged ventricular standstsill. Mechanism appears to be progressive conduction system disease with a previous ECG showing trifascicular block. He will need a PPM.   Risks, benefits, alternatives to PPM implantation were discussed in detail with the patient today. The patient understands that the risks include but are not limited to bleeding, infection, pneumothorax, perforation, tamponade, vascular damage, renal failure, MI, stroke, death, and lead dislodgement and wishes to proceed.    Keep NPO after MN.    For questions or updates, please contact [facility] Please consult [email or web address] for contact info under    Signed, Lanier Prude, MD  03/06/2023 9:48 AM

## 2023-03-06 NOTE — Progress Notes (Signed)
  Echocardiogram 2D Echocardiogram has been performed.  Delcie Roch 03/06/2023, 5:03 PM

## 2023-03-07 ENCOUNTER — Other Ambulatory Visit: Payer: Self-pay

## 2023-03-07 ENCOUNTER — Encounter (HOSPITAL_COMMUNITY)
Admission: EM | Disposition: A | Discharge status: Home / Self Care | Payer: Self-pay | Source: Home / Self Care | Attending: Internal Medicine

## 2023-03-07 DIAGNOSIS — I459 Conduction disorder, unspecified: Secondary | ICD-10-CM | POA: Diagnosis not present

## 2023-03-07 HISTORY — PX: PACEMAKER IMPLANT: EP1218

## 2023-03-07 LAB — GLUCOSE, CAPILLARY
Glucose-Capillary: 220 mg/dL — ABNORMAL HIGH (ref 70–99)
Glucose-Capillary: 177 mg/dL — ABNORMAL HIGH (ref 70–99)
Glucose-Capillary: 126 mg/dL — ABNORMAL HIGH (ref 70–99)
Glucose-Capillary: 121 mg/dL — ABNORMAL HIGH (ref 70–99)
Glucose-Capillary: 287 mg/dL — ABNORMAL HIGH (ref 70–99)

## 2023-03-07 SURGERY — PACEMAKER IMPLANT

## 2023-03-07 MED ORDER — ONDANSETRON HCL 4 MG/2ML IJ SOLN: 4.0000 mg | Freq: Four times a day (QID) | INTRAMUSCULAR | Status: DC | PRN

## 2023-03-07 MED ORDER — FENTANYL CITRATE (PF) 100 MCG/2ML IJ SOLN
INTRAMUSCULAR | Status: DC | PRN
  Administered 2023-03-07: 25 ug via INTRAVENOUS

## 2023-03-07 MED ORDER — LIDOCAINE HCL (PF) 1 % IJ SOLN
INTRAMUSCULAR | Status: DC | PRN
  Administered 2023-03-07: 60 mL

## 2023-03-07 MED ORDER — LIDOCAINE HCL 1 % IJ SOLN
INTRAMUSCULAR | Status: AC
  Filled 2023-03-07: qty 60

## 2023-03-07 MED ORDER — FENTANYL CITRATE (PF) 100 MCG/2ML IJ SOLN
INTRAMUSCULAR | Status: AC
  Filled 2023-03-07: qty 2

## 2023-03-07 MED ORDER — SODIUM CHLORIDE 0.9 % IV SOLN: INTRAVENOUS | Status: DC

## 2023-03-07 MED ORDER — SODIUM CHLORIDE 0.9 % IV SOLN
80.0000 mg | INTRAVENOUS | Status: AC
  Administered 2023-03-07: 80 mg
  Filled 2023-03-07: qty 2

## 2023-03-07 MED ORDER — MIDAZOLAM HCL 5 MG/5ML IJ SOLN
INTRAMUSCULAR | Status: AC
  Filled 2023-03-07: qty 5

## 2023-03-07 MED ORDER — CEFAZOLIN SODIUM-DEXTROSE 2-4 GM/100ML-% IV SOLN
2.0000 g | INTRAVENOUS | Status: AC
  Administered 2023-03-07: 2 g via INTRAVENOUS
  Filled 2023-03-07: qty 100

## 2023-03-07 MED ORDER — SODIUM CHLORIDE 0.9 % IV SOLN
INTRAVENOUS | Status: AC
  Filled 2023-03-07: qty 2

## 2023-03-07 MED ORDER — HEPARIN (PORCINE) IN NACL 1000-0.9 UT/500ML-% IV SOLN
INTRAVENOUS | Status: DC | PRN
  Administered 2023-03-07: 500 mL

## 2023-03-07 MED ORDER — MIDAZOLAM HCL 5 MG/5ML IJ SOLN
INTRAMUSCULAR | Status: DC | PRN
  Administered 2023-03-07: 1 mg via INTRAVENOUS

## 2023-03-07 MED ORDER — CEFAZOLIN SODIUM-DEXTROSE 2-4 GM/100ML-% IV SOLN
INTRAVENOUS | Status: AC
  Filled 2023-03-07: qty 100

## 2023-03-07 SURGICAL SUPPLY — 13 items
CABLE SURGICAL S-101-97-12 (CABLE) ×1 IMPLANT
CATH RIGHTSITE C315HIS02 (CATHETERS) IMPLANT
IPG PACE AZUR XT DR MRI W1DR01 (Pacemaker) IMPLANT
LEAD CAPSURE NOVUS 5076-52CM (Lead) IMPLANT
LEAD SELECT SECURE 3830 383069 (Lead) IMPLANT
PACE AZURE XT DR MRI W1DR01 (Pacemaker) ×1 IMPLANT
PAD DEFIB RADIO PHYSIO CONN (PAD) ×1 IMPLANT
SELECT SECURE 3830 383069 (Lead) ×1 IMPLANT
SHEATH 7FR PRELUDE SNAP 13 (SHEATH) IMPLANT
SHEATH PROBE COVER 6X72 (BAG) IMPLANT
SLITTER 6232ADJ (MISCELLANEOUS) IMPLANT
TRAY PACEMAKER INSERTION (PACKS) ×1 IMPLANT
WIRE HI TORQ VERSACORE-J 145CM (WIRE) IMPLANT

## 2023-03-07 NOTE — Plan of Care (Signed)
  Problem: Education: Goal: Ability to describe self-care measures that may prevent or decrease complications (Diabetes Survival Skills Education) will improve Outcome: Progressing Goal: Individualized Educational Video(s) Outcome: Progressing   Problem: Coping: Goal: Ability to adjust to condition or change in health will improve Outcome: Progressing   Problem: Fluid Volume: Goal: Ability to maintain a balanced intake and output will improve Outcome: Progressing   Problem: Health Behavior/Discharge Planning: Goal: Ability to identify and utilize available resources and services will improve Outcome: Progressing Goal: Ability to manage health-related needs will improve Outcome: Progressing   Problem: Metabolic: Goal: Ability to maintain appropriate glucose levels will improve Outcome: Progressing   Problem: Nutritional: Goal: Maintenance of adequate nutrition will improve Outcome: Progressing Goal: Progress toward achieving an optimal weight will improve Outcome: Progressing   Problem: Skin Integrity: Goal: Risk for impaired skin integrity will decrease Outcome: Progressing   Problem: Tissue Perfusion: Goal: Adequacy of tissue perfusion will improve Outcome: Progressing   Problem: Education: Goal: Knowledge of General Education information will improve Description: Including pain rating scale, medication(s)/side effects and non-pharmacologic comfort measures Outcome: Progressing   Problem: Health Behavior/Discharge Planning: Goal: Ability to manage health-related needs will improve Outcome: Progressing   Problem: Clinical Measurements: Goal: Ability to maintain clinical measurements within normal limits will improve Outcome: Progressing Goal: Will remain free from infection Outcome: Progressing Goal: Diagnostic test results will improve Outcome: Progressing Goal: Respiratory complications will improve Outcome: Progressing Goal: Cardiovascular complication will  be avoided Outcome: Progressing   Problem: Activity: Goal: Risk for activity intolerance will decrease Outcome: Progressing   Problem: Nutrition: Goal: Adequate nutrition will be maintained Outcome: Progressing   Problem: Coping: Goal: Level of anxiety will decrease Outcome: Progressing   Problem: Elimination: Goal: Will not experience complications related to bowel motility Outcome: Progressing Goal: Will not experience complications related to urinary retention Outcome: Progressing   Problem: Pain Managment: Goal: General experience of comfort will improve Outcome: Progressing   Problem: Safety: Goal: Ability to remain free from injury will improve Outcome: Progressing   Problem: Skin Integrity: Goal: Risk for impaired skin integrity will decrease Outcome: Progressing   Problem: Education: Goal: Knowledge of cardiac device and self-care will improve Outcome: Progressing Goal: Ability to safely manage health related needs after discharge will improve Outcome: Progressing Goal: Individualized Educational Video(s) Outcome: Progressing   Problem: Cardiac: Goal: Ability to achieve and maintain adequate cardiopulmonary perfusion will improve Outcome: Progressing   

## 2023-03-07 NOTE — Progress Notes (Signed)
Rounding Note    Patient Name: Carl Barnes Date of Encounter: 03/07/2023  [facility] Cardiologist: Yvonne Kendall, MD   Subjective   Feels well  Inpatient Medications    Scheduled Meds:  aspirin EC  81 mg Oral Daily   gabapentin  600 mg Oral BID   insulin aspart  0-9 Units Subcutaneous TID WC   insulin glargine-yfgn  20 Units Subcutaneous QHS   loratadine  10 mg Oral Daily   methocarbamol  500 mg Oral BID   nicotine  7 mg Transdermal Daily   pravastatin  40 mg Oral QHS   Continuous Infusions:  PRN Meds: acetaminophen, nicotine polacrilex   Vital Signs    Vitals:   03/06/23 2021 03/07/23 0000 03/07/23 0601 03/07/23 0731  BP: 125/71  139/71 (!) 142/75  Pulse: 61  (!) 50 61  Resp: 16  16 15   Temp: 98.3 F (36.8 C)  (!) 97.5 F (36.4 C) 97.6 F (36.4 C)  TempSrc: Oral  Oral Oral  SpO2: 99% 97% 98% 100%  Weight:      Height:        Intake/Output Summary (Last 24 hours) at 03/07/2023 0943 Last data filed at 03/06/2023 2020 Gross per 24 hour  Intake --  Output 200 ml  Net -200 ml      03/05/2023    9:18 PM 03/05/2023    8:17 PM 03/05/2023    4:00 PM  Last 3 Weights  Weight (lbs) 200 lb 201 lb 200 lb 2.8 oz  Weight (kg) 90.719 kg 91.173 kg 90.8 kg      Telemetry    SB/SR 50's-60's, nocturnal rates 40's - Personally Reviewed  ECG    No new EKGs - Personally Reviewed  Physical Exam   GEN: No acute distress.   Neck: No JVD Cardiac: RRR, no murmurs, rubs, or gallops.  Respiratory: Clear to auscultation bilaterally. GI: Soft, nontender, non-distended  MS: No edema; No deformity. Neuro:  Nonfocal  Psych: Normal affect   Labs    High Sensitivity Troponin:   Recent Labs  Lab 03/05/23 1622 03/05/23 1756  TROPONINIHS 12 15     Chemistry Recent Labs  Lab 03/05/23 1622 03/05/23 1756 03/06/23 0158  NA 134*  --  135  K 4.2  --  3.9  CL 102  --  104  CO2 22  --  23  GLUCOSE 317*  --  302*  BUN 9  --  12  CREATININE 0.91  --   0.88  CALCIUM 9.4  --  8.9  MG  --  1.9  --   GFRNONAA >60  --  >60  ANIONGAP 10  --  8    Lipids No results for input(s): "CHOL", "TRIG", "HDL", "LABVLDL", "LDLCALC", "CHOLHDL" in the last 168 hours.  Hematology Recent Labs  Lab 03/05/23 1622 03/06/23 0158  WBC 9.3 8.3  RBC 5.03 4.77  HGB 16.4 15.0  HCT 48.3 44.7  MCV 96.0 93.7  MCH 32.6 31.4  MCHC 34.0 33.6  RDW 12.3 12.2  PLT 136* 116*   Thyroid  Recent Labs  Lab 03/05/23 2147  TSH 2.994    BNPNo results for input(s): "BNP", "PROBNP" in the last 168 hours.  DDimer No results for input(s): "DDIMER" in the last 168 hours.   Radiology      Cardiac Studies    03/06/23: TTE  1. Left ventricular ejection fraction, by estimation, is 55 to 60%. Left  ventricular ejection fraction by PLAX  is 58 %. The left ventricle has  normal function. The left ventricle has no regional wall motion  abnormalities. There is mild left ventricular  hypertrophy. Left ventricular diastolic parameters are consistent with  Grade I diastolic dysfunction (impaired relaxation).   2. Right ventricular systolic function is normal. The right ventricular  size is normal. Tricuspid regurgitation signal is inadequate for assessing  PA pressure.   3. The mitral valve is abnormal. Trivial mitral valve regurgitation.   4. The aortic valve is tricuspid. Aortic valve regurgitation is not  visualized. No aortic stenosis is present.   5. The inferior vena cava is normal in size with greater than 50%  respiratory variability, suggesting right atrial pressure of 3 mmHg.   Comparison(s): Changes from prior study are noted. 12/10/2021: LVEF 60-65%.    Patient Profile     72 y.o. male w/PMHx of DM, CAD, HLD, smoker admitted with symptomatic bradycardia, prolonged pauses/V standstill  Assessment & Plan    #Symptomatic Bradycardia #Ventricular standstill #CHB Patient with recurrent symptomatic bradycardia episodes corresponding to CHB with prolonged  ventricular standstsill.  Mechanism appears to be progressive conduction system disease with a previous ECG showing trifascicular block.    Planned for PPM today Dr. Lalla Brothers has seen him, answered his follow up questions Patient remains agreeable    For questions or updates, please contact [facility] Please consult [email or web address] for contact info under        Signed, Sheilah Pigeon, PA-C  03/07/2023, 9:43 AM

## 2023-03-07 NOTE — Inpatient Diabetes Management (Signed)
Inpatient Diabetes Program Recommendations  AACE/ADA: New Consensus Statement on Inpatient Glycemic Control   Target Ranges:  Prepandial:   less than 140 mg/dL      Peak postprandial:   less than 180 mg/dL (1-2 hours)      Critically ill patients:  140 - 180 mg/dL    Latest Reference Range & Units 03/06/23 07:36 03/06/23 11:25 03/06/23 16:03 03/06/23 18:18 03/06/23 21:20 03/07/23 07:30  Glucose-Capillary 70 - 99 mg/dL 161 (H) 096 (H) 045 (H) 340 (H) 318 (H) 220 (H)   Review of Glycemic Control  Diabetes history: DM2 Outpatient Diabetes medications: Lantus 38 units daily, Metformin 500 mg QHS Current orders for Inpatient glycemic control: Semglee 20 units at bedtime, Novolog 0-9 units TID with meals  Inpatient Diabetes Program Recommendations:    Insulin: Please consider increasing Semglee to 25 units at bedtime, adding Novolog 0-5 units at bedtime, and Novolog 3 units TID with meals for meal coverage if patient eats at least 50% of meals.  Thanks, Orlando Penner, RN, MSN, CDCES Diabetes Coordinator Inpatient Diabetes Program [phone] (Team Pager from 8am to 5pm)

## 2023-03-08 ENCOUNTER — Inpatient Hospital Stay (HOSPITAL_COMMUNITY): Payer: Medicare Other

## 2023-03-08 ENCOUNTER — Encounter (HOSPITAL_COMMUNITY): Payer: Self-pay | Admitting: Cardiology

## 2023-03-08 DIAGNOSIS — I459 Conduction disorder, unspecified: Secondary | ICD-10-CM | POA: Diagnosis not present

## 2023-03-08 LAB — GLUCOSE, CAPILLARY: Glucose-Capillary: 280 mg/dL — ABNORMAL HIGH (ref 70–99)

## 2023-03-08 NOTE — Progress Notes (Signed)
Discharge instructions reviewed with pt and his wife. Both verbalized understanding of instructions, no questions at this time.  Copy of instructions given to pt. No changes in pt's medications, no new scripts.  Pt to be d/c'd via wheelchair with belongings, with his wife.     To be escorted by hospital volunteer.   Mann Skaggs,RN SWOT

## 2023-03-08 NOTE — Discharge Instructions (Addendum)
After Your Pacemaker   You have a Medtronic Pacemaker  ACTIVITY Do not lift your arm above shoulder height for 1 week after your procedure. After 7 days, you may progress as below.  You should remove your sling 24 hours after your procedure, unless otherwise instructed by your provider.     Tuesday March 15, 2023  Wednesday March 16, 2023 Thursday March 17, 2023 Friday March 18, 2023   Do not lift, push, pull, or carry anything over 10 pounds with the affected arm until 6 weeks (Tuesday April 19, 2023 ) after your procedure.   You may drive AFTER your wound check, unless you have been told otherwise by your provider.   Ask your healthcare provider when you can go back to work   INCISION/Dressing  Monitor your Pacemaker site for redness, swelling, and drainage. Call the device clinic at [phone] if you experience these symptoms or fever/chills.  If your incision is sealed with Steri-strips or staples, you may shower 7 days after your procedure or when told by your provider. Do not remove the steri-strips or let the shower hit directly on your site. You may wash around your site with soap and water.    If you were discharged in a sling, please do not wear this during the day more than 48 hours after your surgery unless otherwise instructed. This may increase the risk of stiffness and soreness in your shoulder.   Avoid lotions, ointments, or perfumes over your incision until it is well-healed.  You may use a hot tub or a pool AFTER your wound check appointment if the incision is completely closed.  Pacemaker Alerts:  Some alerts are vibratory and others beep. These are NOT emergencies. Please call our office to let us know. If this occurs at night or on weekends, it can wait until the next business day. Send a remote transmission.  If your device is capable of reading fluid status (for heart failure), you will be offered monthly monitoring to review this with you.   DEVICE  MANAGEMENT Remote monitoring is used to monitor your pacemaker from home. This monitoring is scheduled every 91 days by our office. It allows [place] to keep an eye on the functioning of your device to ensure it is working properly. You will routinely see your Electrophysiologist annually (more often if necessary).   You should receive your ID card for your new device in 4-8 weeks. Keep this card with you at all times once received. Consider wearing a medical alert bracelet or necklace.  Your Pacemaker may be MRI compatible. This will be discussed at your next office visit/wound check.  You should avoid contact with strong electric or magnetic fields.   Do not use amateur (ham) radio equipment or electric (arc) welding torches. MP3 player headphones with magnets should not be used. Some devices are safe to use if held at least 12 inches (30 cm) from your Pacemaker. These include power tools, lawn mowers, and speakers. If you are unsure if something is safe to use, ask your health care provider.  When using your cell phone, hold it to the ear that is on the opposite side from the Pacemaker. Do not leave your cell phone in a pocket over the Pacemaker.  You may safely use electric blankets, heating pads, computers, and microwave ovens.  Call the office right away if: You have chest pain. You feel more short of breath than you have felt before. You feel more light-headed than you have  felt before. Your incision starts to open up.  This information is not intended to replace advice given to you by your health care provider. Make sure you discuss any questions you have with your health care provider.

## 2023-03-08 NOTE — TOC Initial Note (Signed)
Transition of Care [facility]) - Initial/Assessment Note    Patient Details  Name: Carl Barnes MRN: [id number] Date of Birth: 11-14-1950  Transition of Care [facility]) CM/SW Contact:    Leone Haven, RN Phone Number: 03/08/2023, 11:53 AM  Clinical Narrative:                 Patient has been discharged per Staff RN before NCM could see patient.  Staff RN states he did not have any needs.         Patient Goals and CMS Choice            Expected Discharge Plan and Services         Expected Discharge Date: 03/08/23                                    Prior Living Arrangements/Services                       Activities of Daily Living Home Assistive Devices/Equipment: None ADL Screening (condition at time of admission) Patient's cognitive ability adequate to safely complete daily activities?: Yes Is the patient deaf or have difficulty hearing?: No Does the patient have difficulty seeing, even when wearing glasses/contacts?: No Does the patient have difficulty concentrating, remembering, or making decisions?: No Patient able to express need for assistance with ADLs?: Yes Does the patient have difficulty dressing or bathing?: No Independently performs ADLs?: Yes (appropriate for developmental age) Does the patient have difficulty walking or climbing stairs?: No Weakness of Legs: None Weakness of Arms/Hands: None  Permission Sought/Granted                  Emotional Assessment              Admission diagnosis:  Heart block [I45.9] Near syncope [R55] Patient Active Problem List   Diagnosis Date Noted   Heart block 03/05/2023   Cigarette nicotine dependence without complication 03/05/2023   Skin lesion 10/06/2022   Snoring 07/04/2022   Memory change 07/04/2022   B12 deficiency 07/04/2022   Dyspnea on exertion 12/30/2021   Diastolic dysfunction 12/30/2021   Hyponatremia 11/01/2021   Type 2 diabetes mellitus with complication, with  long-term current use of insulin (HCC) 10/30/2021   Medicare annual wellness visit, subsequent 10/19/2020   Healthcare maintenance 11/04/2019   Dysuria 08/12/2019   Left leg pain 07/13/2019   Lumbar radiculopathy 06/06/2019   Dizziness 10/29/2018   Stye 10/29/2018   RBBB 07/12/2018   External hemorrhoid 12/17/2016   CAD in native artery 09/14/2016   Abnormal stress test 07/06/2016   Syncope 05/19/2016   Advance care planning 05/19/2016   Diverticulosis 04/14/2012   SMOKER 05/30/2008   RENAL CALCULUS, RECURRENT 05/15/2008   RECTAL BLEEDING 05/10/2008   CARPAL TUNNEL SYNDROME, RIGHT 10/26/2006   Diabetes (HCC) 05/27/2004   Hyperlipidemia associated with type 2 diabetes mellitus (HCC) 12/21/1997   UNSPECIFIED HEARING LOSS 12/21/1997   PCP:  Joaquim Nam, MD Pharmacy:   [facility] - [place], [place] - [address] [place] [address] Phone: [phone] Fax: [phone]     Social Determinants of Health (SDOH) Social History: SDOH Screenings   Food Insecurity: No Food Insecurity (03/06/2023)  Housing: Patient Declined (03/06/2023)  Transportation Needs: No Transportation Needs (03/06/2023)  Utilities: Not At Risk (03/06/2023)  Alcohol Screen: Low Risk  (12/15/2022)  Depression (PHQ2-9): Medium Risk (02/23/2023)  Financial Resource Strain: Low Risk  (12/15/2022)  Physical Activity: Sufficiently Active (12/15/2022)  Social Connections: Moderately Integrated (12/15/2022)  Stress: No Stress Concern Present (12/15/2022)  Tobacco Use: High Risk (03/05/2023)   SDOH Interventions:     Readmission Risk Interventions     No data to display

## 2023-03-08 NOTE — Care Management Important Message (Signed)
Important Message  Patient Details  Name: Carl Barnes MRN: [id number] Date of Birth: 07/17/1951   Medicare Important Message Given:  Yes     Renie Ora 03/08/2023, 8:34 AM

## 2023-03-08 NOTE — Discharge Summary (Addendum)
ELECTROPHYSIOLOGY PROCEDURE DISCHARGE SUMMARY    Patient ID: Carl Barnes,  MRN: [id number], DOB/AGE: 1950/12/28 72 y.o.  Admit date: 03/05/2023 Discharge date: 03/08/2023  Primary Care Physician: Joaquim Nam, MD  Primary Cardiologist: Dr. Okey Dupre Electrophysiologist: new, Dr. Lalla Brothers  Primary Discharge Diagnosis:  Symptomatic bradycardia  V standstill/pauses status post pacemaker implantation this admission  Secondary Discharge Diagnosis:  CAD DM HLD  Allergies  Allergen Reactions   Chantix [Varenicline] Other (See Comments)    Intolerant    Codeine Other (See Comments)    GI upset   Metformin And Related Other (See Comments)    Max dose 500mg -1000mg  a day     Procedures This Admission:  1.  Implantation of a MDT dual chamber PPM on 03/07/23 by Dr Lalla Brothers.   There were no immediate post procedure complications. CXR on 03/07/23 demonstrated no pneumothorax status post device implantation.   Brief HPI: Carl Barnes is a 72 y.o. male was seen out patient for dizziness, near syncope, monitoring started and found to have prolonged pauses, Ventricular standstill and admitted for further evaluation and management  Hospital Course:  The patient was admitted, no reversible causes found, LVEF was preserved by his echo, and he underwent implantation of a PPM with details as outlined in the procedure report.  He was monitored on telemetry throughout his stay.  Left chest was without hematoma or ecchymosis.  The device was interrogated and found to be functioning normally.  CXR was obtained and demonstrated no pneumothorax status post device implantation.  Wound care, arm mobility, and restrictions were reviewed with the patient.  The patient feels well, denies any P/SOB, with minimal site discomfort.  He was examined by Dr. Elberta Fortis and considered stable for discharge to home.   Patient with longstanding poor DM control, urged to see his PMD and follow up and revisit his DM  management/medications, diet   Physical Exam: Vitals:   03/07/23 2017 03/08/23 0000 03/08/23 0400 03/08/23 0447  BP: 126/83   133/81  Pulse:    60  Resp:    18  Temp:    98.2 F (36.8 C)  TempSrc: Oral   Oral  SpO2:  98% 95%   Weight:      Height:        GEN- The patient is well appearing, alert and oriented x 3 today.   HEENT: normocephalic, atraumatic; sclera clear, conjunctiva pink; hearing intact; oropharynx clear; neck supple, no JVP Lungs- CTA b/l, normal work of breathing.  No wheezes, rales, rhonchi Heart- RRR, no murmurs, rubs or gallops, PMI not laterally displaced GI- soft, non-tender, non-distended Extremities- no clubbing, cyanosis, or edema MS- no significant deformity or atrophy Skin- warm and dry, no rash or lesion, left chest without hematoma/ecchymosis Psych- euthymic mood, full affect Neuro- no gross deficits   Labs:   Lab Results  Component Value Date   WBC 8.3 03/06/2023   HGB 15.0 03/06/2023   HCT 44.7 03/06/2023   MCV 93.7 03/06/2023   PLT 116 (L) 03/06/2023    Recent Labs  Lab 03/06/23 0158  NA 135  K 3.9  CL 104  CO2 23  BUN 12  CREATININE 0.88  CALCIUM 8.9  GLUCOSE 302*    Discharge Medications:  Allergies as of 03/08/2023       Reactions   Chantix [varenicline] Other (See Comments)   Intolerant   Codeine Other (See Comments)   GI upset   Metformin And Related Other (See Comments)  Max dose 500mg -1000mg  a day        Medication List     TAKE these medications    Accu-Chek Aviva Plus test strip Generic drug: glucose blood CHECK SUGAR DAILY UP TO 3 TIMES DAILY. INSULIN TREATED DM2   acetaminophen 500 MG tablet Commonly known as: TYLENOL Take 1,000 mg by mouth daily.   aspirin EC 81 MG tablet Take 1 tablet (81 mg total) by mouth daily.   B-D UF III MINI PEN NEEDLES 31G X 5 MM Misc Generic drug: Insulin Pen Needle USE DAILY WITH INSULIN PEN   cetirizine 10 MG tablet Commonly known as: ZYRTEC Take 10 mg by  mouth daily as needed (for seasonal allergies with lawn mowing).   cyanocobalamin 1000 MCG/ML injection Commonly known as: VITAMIN B12 1000 mcg injected monthly   gabapentin 600 MG tablet Commonly known as: NEURONTIN Take 600 mg by mouth in the morning and at bedtime.   Lantus SoloStar 100 UNIT/ML Solostar Pen Generic drug: insulin glargine INJECT 38 UNITS UNDER THE SKIN ONCE DAILY   meloxicam 7.5 MG tablet Commonly known as: MOBIC Take 7.5 mg by mouth 2 (two) times daily.   metFORMIN 500 MG tablet Commonly known as: GLUCOPHAGE Take 1 tablet (500 mg total) by mouth at bedtime.   methocarbamol 500 MG tablet Commonly known as: ROBAXIN Take 500 mg by mouth 2 (two) times daily.   pravastatin 40 MG tablet Commonly known as: PRAVACHOL TAKE ONE TABLET BY MOUTH EVERY NIGHT AT BEDTIME   SYRINGE-NEEDLE (DISP) 3 ML 25G X 1" 3 ML Misc Use with B12 injection        Disposition: Home Discharge Instructions     Diet - low sodium heart healthy   Complete by: As directed    Increase activity slowly   Complete by: As directed         Duration of Discharge Encounter: Greater than 30 minutes including physician time.  Norma Fredrickson, PA-C 03/08/2023 10:47 AM   I have seen and examined this patient with Francis Dowse.  Agree with above, note added to reflect my findings.  On exam, RRR, no murmurs, lungs clear.  She is now status post pacemaker implant for heart block.  Device functioning appropriately.  Chest x-ray and interrogation without issue.  Plan for discharge today with follow-up in device clinic.  Dianne Whelchel M. Shalise Rosado MD 03/08/2023 5:01 PM

## 2023-03-09 ENCOUNTER — Telehealth: Payer: Self-pay

## 2023-03-09 NOTE — Transitions of Care (Post Inpatient/ED Visit) (Signed)
   03/09/2023  Name: Carl Barnes MRN: [id number] DOB: 1950-10-19  Today's TOC FU Call Status: Today's TOC FU Call Status:: Unsuccessul Call (1st Attempt) Unsuccessful Call (1st Attempt) Date: 03/09/23  Attempted to reach the patient regarding the most recent Inpatient/ED visit.  Follow Up Plan: Additional outreach attempts will be made to reach the patient to complete the Transitions of Care (Post Inpatient/ED visit) call.   Signature   Woodfin Ganja LPN [facility] Nurse Health Advisor Direct Dial [phone]

## 2023-03-10 NOTE — Transitions of Care (Post Inpatient/ED Visit) (Unsigned)
   03/10/2023  Name: Carl Barnes MRN: [id number] DOB: [birth year]  Today's TOC FU Call Status: Today's TOC FU Call Status:: Unsuccessful Call (2nd Attempt) Unsuccessful Call (1st Attempt) Date: 03/09/23 Unsuccessful Call (2nd Attempt) Date: 03/10/23  Attempted to reach the patient regarding the most recent Inpatient/ED visit.  Follow Up Plan: Additional outreach attempts will be made to reach the patient to complete the Transitions of Care (Post Inpatient/ED visit) call.   Signature   Woodfin Ganja LPN [facility] Nurse Health Advisor Direct Dial [phone]

## 2023-03-14 NOTE — Transitions of Care (Post Inpatient/ED Visit) (Signed)
   03/14/2023  Name: Carl Barnes MRN: [id number] DOB: [birth year]  Today's TOC FU Call Status: Today's TOC FU Call Status:: Successful TOC FU Call Competed Unsuccessful Call (1st Attempt) Date: 03/09/23 Unsuccessful Call (2nd Attempt) Date: 03/10/23 Unsuccessful Call (3rd Attempt) Date: 03/14/23 [facility] FU Call Complete Date: 03/14/23  Attempted to reach the patient regarding the most recent Inpatient/ED visit.  Follow Up Plan: No further outreach attempts will be made at this time. We have been unable to contact the patient.  Signature   Woodfin Ganja LPN [facility] Nurse Health Advisor Direct Dial [phone]

## 2023-03-17 ENCOUNTER — Telehealth: Payer: Self-pay | Admitting: Cardiology

## 2023-03-17 NOTE — Telephone Encounter (Signed)
Zio report was old, already in system and addressed. See note in epic. Patient also received pacemaker and has follow up in office tomorrow 07/26

## 2023-03-17 NOTE — Telephone Encounter (Signed)
Zio report given to nurse

## 2023-03-17 NOTE — Telephone Encounter (Signed)
Thanks, I just had it faxed to [place].

## 2023-03-17 NOTE — Progress Notes (Signed)
Wound check appointment.  Steri-strips removed. Wound without redness or edema. Small area of incision openned with steri strip removal. Steri-strips re-applied to area.    Normal device function. Thresholds, sensing, and impedances consistent with implant measurements. Device programmed at 3.5V/auto capture programmed on for extra safety margin until 3 month visit. Histogram distribution appropriate for patient and level of activity. No mode switches or high ventricular rates noted. Patient educated about wound care, arm mobility, lifting restrictions. No showering at this time. ROV with NP in 1 week.  ROV in 3 months with implanting physician

## 2023-03-17 NOTE — Patient Instructions (Addendum)
Medication Instructions:  Your physician recommends that you continue on your current medications as directed. Please refer to the Current Medication list given to you today.  *If you need a refill on your cardiac medications before your next appointment, please call your pharmacy*   Lab Work: No labs ordered  If you have labs (blood work) drawn today and your tests are completely normal, you will receive your results only by: MyChart Message (if you have MyChart) OR A paper copy in the mail If you have any lab test that is abnormal or we need to change your treatment, we will call you to review the results.   Testing/Procedures: No testing ordered  Follow-Up: At [facility], you and your health needs are our priority.  As part of our continuing mission to provide you with exceptional heart care, we have created designated Provider Care Teams.  These Care Teams include your primary Cardiologist (physician) and Advanced Practice Providers (APPs -  Physician Assistants and Nurse Practitioners) who all work together to provide you with the care you need, when you need it.  We recommend signing up for the patient portal called "MyChart".  Sign up information is provided on this After Visit Summary.  MyChart is used to connect with patients for Virtual Visits (Telemedicine).  Patients are able to view lab/test results, encounter notes, upcoming appointments, etc.  Non-urgent messages can be sent to your provider as well.   To learn more about what you can do with MyChart, go to [email or web address].    Your next appointment:   1 week(s)  Provider:   Sherie Don, NP   Other: Please do not shower until your wound is re-evaluated in our office next week. Call device clinic if incision opens, has drainage, or begins to hurt more.

## 2023-03-18 ENCOUNTER — Ambulatory Visit: Payer: Medicare Other | Admitting: Cardiology

## 2023-03-18 ENCOUNTER — Encounter: Payer: Self-pay | Admitting: Cardiology

## 2023-03-18 VITALS — BP 134/76 | HR 67 | Ht 71.0 in | Wt 200.2 lb

## 2023-03-18 DIAGNOSIS — Z950 Presence of cardiac pacemaker: Secondary | ICD-10-CM

## 2023-03-18 LAB — CUP PACEART INCLINIC DEVICE CHECK
Date Time Interrogation Session: 20240726115058
Implantable Lead Connection Status: 753985
Implantable Lead Connection Status: 753985
Implantable Lead Implant Date: 20240715
Implantable Lead Implant Date: 20240715
Implantable Lead Location: 753859
Implantable Lead Location: 753860
Implantable Lead Model: 3830
Implantable Lead Model: 5076
Implantable Pulse Generator Implant Date: 20240715

## 2023-03-24 NOTE — Patient Instructions (Signed)
OK to shower Let warm soapy water run down incision Do not scrub incision Pat dry with towel  Keep incision open to air - no ointments, creams, salves, or bandages.  Call device clinic if incision opens, has drainage, or begins to hurt more.

## 2023-03-24 NOTE — Progress Notes (Signed)
Follow-up wound check  Steri-strips removed. Wound without redness or edema. Incision edges approximated, wound well healed. Normal device function. Thresholds, sensing, and impedances consistent with implant measurements. Device programmed at 3.5V/auto capture programmed on for extra safety margin until 3 month visit. Histogram distribution appropriate for patient and level of activity. No mode switches or high ventricular rates noted. Patient educated about wound care, arm mobility, lifting restrictions. ROV in 3 months with implanting physician

## 2023-03-25 ENCOUNTER — Ambulatory Visit: Payer: Medicare Other | Admitting: Cardiology

## 2023-03-25 DIAGNOSIS — Z950 Presence of cardiac pacemaker: Secondary | ICD-10-CM

## 2023-03-25 LAB — CUP PACEART INCLINIC DEVICE CHECK
Date Time Interrogation Session: 20240802122316
Implantable Lead Connection Status: 753985
Implantable Lead Connection Status: 753985
Implantable Lead Implant Date: 20240715
Implantable Lead Implant Date: 20240715
Implantable Lead Location: 753859
Implantable Lead Location: 753860
Implantable Lead Model: 3830
Implantable Lead Model: 5076
Implantable Pulse Generator Implant Date: 20240715

## 2023-04-12 ENCOUNTER — Ambulatory Visit: Payer: Medicare Other | Attending: Medical

## 2023-04-12 ENCOUNTER — Telehealth: Payer: Self-pay | Admitting: Cardiology

## 2023-04-12 ENCOUNTER — Ambulatory Visit: Payer: Medicare Other

## 2023-04-12 DIAGNOSIS — R55 Syncope and collapse: Secondary | ICD-10-CM | POA: Diagnosis not present

## 2023-04-12 NOTE — Telephone Encounter (Signed)
Left message to call back  

## 2023-04-12 NOTE — Telephone Encounter (Signed)
Patient wants to discuss pacemaker & his limitations that was advised til end September, please assist

## 2023-04-13 NOTE — Telephone Encounter (Signed)
Called patient back, informed him that lifting restrictions are in until 04/18/23, and that patient should ease back into daily activities, patient is ok to mow grass on riding lawn mower as well as get in the pool as long incision site is compelety healed patient voiced understanding of these instructions

## 2023-04-19 ENCOUNTER — Ambulatory Visit: Payer: Medicare Other | Admitting: Medical

## 2023-04-25 ENCOUNTER — Other Ambulatory Visit: Payer: Self-pay

## 2023-04-25 ENCOUNTER — Emergency Department (HOSPITAL_BASED_OUTPATIENT_CLINIC_OR_DEPARTMENT_OTHER): Payer: Medicare Other | Admitting: Radiology

## 2023-04-25 ENCOUNTER — Observation Stay (HOSPITAL_BASED_OUTPATIENT_CLINIC_OR_DEPARTMENT_OTHER)
Admission: EM | Admit: 2023-04-25 | Discharge: 2023-04-27 | Disposition: A | Discharge status: Home / Self Care | Payer: Medicare Other | Attending: Family Medicine | Admitting: Family Medicine

## 2023-04-25 ENCOUNTER — Encounter (HOSPITAL_BASED_OUTPATIENT_CLINIC_OR_DEPARTMENT_OTHER): Payer: Self-pay

## 2023-04-25 DIAGNOSIS — Z79899 Other long term (current) drug therapy: Secondary | ICD-10-CM | POA: Diagnosis not present

## 2023-04-25 DIAGNOSIS — Z794 Long term (current) use of insulin: Secondary | ICD-10-CM | POA: Diagnosis not present

## 2023-04-25 DIAGNOSIS — E785 Hyperlipidemia, unspecified: Secondary | ICD-10-CM | POA: Insufficient documentation

## 2023-04-25 DIAGNOSIS — F17210 Nicotine dependence, cigarettes, uncomplicated: Secondary | ICD-10-CM | POA: Diagnosis not present

## 2023-04-25 DIAGNOSIS — J189 Pneumonia, unspecified organism: Secondary | ICD-10-CM | POA: Diagnosis not present

## 2023-04-25 DIAGNOSIS — Z950 Presence of cardiac pacemaker: Secondary | ICD-10-CM | POA: Insufficient documentation

## 2023-04-25 DIAGNOSIS — Z7982 Long term (current) use of aspirin: Secondary | ICD-10-CM | POA: Insufficient documentation

## 2023-04-25 DIAGNOSIS — E871 Hypo-osmolality and hyponatremia: Secondary | ICD-10-CM | POA: Diagnosis not present

## 2023-04-25 DIAGNOSIS — Z7984 Long term (current) use of oral hypoglycemic drugs: Secondary | ICD-10-CM | POA: Insufficient documentation

## 2023-04-25 DIAGNOSIS — E1169 Type 2 diabetes mellitus with other specified complication: Secondary | ICD-10-CM | POA: Diagnosis not present

## 2023-04-25 DIAGNOSIS — Z1152 Encounter for screening for COVID-19: Secondary | ICD-10-CM | POA: Diagnosis not present

## 2023-04-25 DIAGNOSIS — I2510 Atherosclerotic heart disease of native coronary artery without angina pectoris: Secondary | ICD-10-CM | POA: Insufficient documentation

## 2023-04-25 DIAGNOSIS — R509 Fever, unspecified: Secondary | ICD-10-CM | POA: Diagnosis present

## 2023-04-25 DIAGNOSIS — E118 Type 2 diabetes mellitus with unspecified complications: Secondary | ICD-10-CM

## 2023-04-25 DIAGNOSIS — R55 Syncope and collapse: Secondary | ICD-10-CM | POA: Diagnosis present

## 2023-04-25 LAB — BASIC METABOLIC PANEL
Anion gap: 9 (ref 5–15)
BUN: 13 mg/dL (ref 8–23)
CO2: 22 mmol/L (ref 22–32)
Calcium: 8.7 mg/dL — ABNORMAL LOW (ref 8.9–10.3)
Chloride: 102 mmol/L (ref 98–111)
Creatinine, Ser: 0.85 mg/dL (ref 0.61–1.24)
GFR, Estimated: >60 mL/min (ref >60)
Glucose, Bld: 230 mg/dL — ABNORMAL HIGH (ref 70–99)
Potassium: 3.8 mmol/L (ref 3.5–5.1)
Sodium: 133 mmol/L — ABNORMAL LOW (ref 135–145)

## 2023-04-25 LAB — CBC
HCT: 42.7 % (ref 39.0–52.0)
Hemoglobin: 15.1 g/dL (ref 13.0–17.0)
MCH: 32.3 pg (ref 26.0–34.0)
MCHC: 35.4 g/dL (ref 30.0–36.0)
MCV: 91.2 fL (ref 80.0–100.0)
Platelets: 117 10*3/uL — ABNORMAL LOW (ref 150–400)
RBC: 4.68 MIL/uL (ref 4.22–5.81)
RDW: 12.6 % (ref 11.5–15.5)
WBC: 7.6 10*3/uL (ref 4.0–10.5)
nRBC: 0 % (ref 0.0–0.2)

## 2023-04-25 LAB — URINALYSIS, ROUTINE W REFLEX MICROSCOPIC
Bilirubin Urine: NEGATIVE
Glucose, UA: 100 mg/dL — AB
Hgb urine dipstick: NEGATIVE
Ketones, ur: NEGATIVE mg/dL
Leukocytes,Ua: NEGATIVE
Nitrite: NEGATIVE
Specific Gravity, Urine: 1.028 (ref 1.005–1.030)
pH: 5.5 (ref 5.0–8.0)

## 2023-04-25 LAB — SARS CORONAVIRUS 2 BY RT PCR: SARS Coronavirus 2 by RT PCR: NEGATIVE

## 2023-04-25 LAB — C-REACTIVE PROTEIN: CRP: 3 mg/dL — ABNORMAL HIGH (ref <1.0)

## 2023-04-25 LAB — CBG MONITORING, ED: Glucose-Capillary: 252 mg/dL — ABNORMAL HIGH (ref 70–99)

## 2023-04-25 LAB — SEDIMENTATION RATE: Sed Rate: 7 mm/h (ref 0–16)

## 2023-04-25 MED ORDER — LACTATED RINGERS IV BOLUS
500.0000 mL | Freq: Once | INTRAVENOUS | Status: AC
  Administered 2023-04-25: 500 mL via INTRAVENOUS

## 2023-04-25 MED ORDER — AZITHROMYCIN 250 MG PO TABS
500.0000 mg | ORAL_TABLET | ORAL | Status: AC
  Administered 2023-04-25: 500 mg via ORAL
  Filled 2023-04-25: qty 2

## 2023-04-25 MED ORDER — SODIUM CHLORIDE 0.9 % IV SOLN
2.0000 g | Freq: Once | INTRAVENOUS | Status: AC
  Administered 2023-04-25: 2 g via INTRAVENOUS
  Filled 2023-04-25: qty 20

## 2023-04-25 NOTE — ED Triage Notes (Addendum)
Pt to ED c/o dizziness, fever, diarrhea, body aches x 3 days.  Reports took Dayquil at 230pm. Hx pacemaker. Denies chest pain

## 2023-04-25 NOTE — ED Provider Notes (Signed)
[place] EMERGENCY DEPARTMENT AT [facility] Provider Note   CSN: [id number] Arrival date & time: 04/25/23  1627     History {Add pertinent medical, surgical, social history, OB history to HPI:1} Chief Complaint  Patient presents with   Dizziness   Generalized Body Aches    JORIAN BRIMLEY is a 72 y.o. male.  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  Patient reports that for the past 3 days he has been feeling unwell.  He has been feeling dizzy with diffuse arthralgias and muscle aches.  Has had 3 loose stools but no nausea or vomiting or diarrhea otherwise.  No sore throat, runny nose, or cough.  Took a COVID test at home that was negative today.  Did take his temperature by forehead and it was 103.3 max and they decided to come into the emergency department.  No known sick contact did take some DayQuil at 2:30 PM.  They are concerned because he had his pacemaker that was placed recently.       Home Medications Prior to Admission medications   Medication Sig Start Date End Date Taking? Authorizing Provider  ACCU-CHEK AVIVA PLUS test strip CHECK SUGAR DAILY UP TO 3 TIMES DAILY. INSULIN TREATED DM2 12/12/18   Joaquim Nam, MD  acetaminophen (TYLENOL) 500 MG tablet Take 1,000 mg by mouth daily.    [provider]  aspirin EC 81 MG tablet Take 1 tablet (81 mg total) by mouth daily. 07/12/18   End, Cristal Deer, MD  B-D UF III MINI PEN NEEDLES 31G X 5 MM MISC USE DAILY WITH INSULIN PEN 06/15/22   Joaquim Nam, MD  cetirizine (ZYRTEC) 10 MG tablet Take 10 mg by mouth daily as needed (for seasonal allergies with lawn mowing).     [provider]  cyanocobalamin (VITAMIN B12) 1000 MCG/ML injection 1000 mcg injected monthly 10/05/22   Joaquim Nam, MD  gabapentin (NEURONTIN) 600 MG tablet Take 600 mg by mouth in the morning  and at bedtime.    [provider]  insulin glargine (LANTUS SOLOSTAR) 100 UNIT/ML Solostar Pen INJECT 38 UNITS UNDER THE SKIN ONCE DAILY 01/03/23   Joaquim Nam, MD  metFORMIN (GLUCOPHAGE) 500 MG tablet Take 1 tablet (500 mg total) by mouth at bedtime. 01/03/23   Joaquim Nam, MD  pravastatin (PRAVACHOL) 40 MG tablet TAKE ONE TABLET BY MOUTH EVERY NIGHT AT BEDTIME 12/31/22   Joaquim Nam, MD  SYRINGE-NEEDLE, DISP, 3 ML 25G X 1" 3 ML MISC Use with B12 injection 07/07/22   Joaquim Nam, MD      Allergies    Chantix [varenicline], Codeine, and Metformin and related    Review of Systems   Review of Systems  Physical Exam Updated Vital Signs BP 124/80 (BP Location: Right Arm)   Pulse 80   Temp 99.6 F (37.6 C)   Resp 18   Ht 5\' 11"  (1.803 m)   Wt 90.7 kg   SpO2 97%   BMI 27.89 kg/m  Physical Exam Vitals and nursing note reviewed.  Constitutional:      General: He is not in acute distress.    Appearance: He is well-developed.  HENT:     Head: Normocephalic and atraumatic.     Right Ear: External ear normal.     Left Ear: External ear normal.     Nose: Nose normal.  Eyes:  Extraocular Movements: Extraocular movements intact.     Conjunctiva/sclera: Conjunctivae normal.     Pupils: Pupils are equal, round, and reactive to light.  Cardiovascular:     Rate and Rhythm: Normal rate and regular rhythm.     Heart sounds: Normal heart sounds.     Comments: Pacemaker site in upper left chest wall appears clean dry and intact.  No fluctuance or erythema noted. Pulmonary:     Effort: Pulmonary effort is normal. No respiratory distress.     Breath sounds: Normal breath sounds.  Abdominal:     General: There is no distension.     Palpations: Abdomen is soft. There is no mass.     Tenderness: There is no abdominal tenderness. There is no guarding.  Musculoskeletal:     Cervical back: Normal range of motion and neck supple.     Right lower leg: No edema.      Left lower leg: No edema.  Skin:    General: Skin is warm and dry.  Neurological:     Mental Status: He is alert. Mental status is at baseline.  Psychiatric:        Mood and Affect: Mood normal.        Behavior: Behavior normal.     ED Results / Procedures / Treatments   Labs (all labs ordered are listed, but only abnormal results are displayed) Labs Reviewed  BASIC METABOLIC PANEL - Abnormal; Notable for the following components:      Result Value   Sodium 133 (*)    Glucose, Bld 230 (*)    Calcium 8.7 (*)    All other components within normal limits  CBC - Abnormal; Notable for the following components:   Platelets 117 (*)    All other components within normal limits  CBG MONITORING, ED - Abnormal; Notable for the following components:   Glucose-Capillary 252 (*)    All other components within normal limits  SARS CORONAVIRUS 2 BY RT PCR    EKG EKG Interpretation Date/Time:  Monday April 25 2023 16:43:34 EDT Ventricular Rate:  80 PR Interval:  174 QRS Duration:  130 QT Interval:  402 QTC Calculation: 463 R Axis:   -71  Text Interpretation: Atrial-sensed ventricular-paced rhythm Abnormal ECG When compared with ECG of 08-Mar-2023 05:00, Electronic ventricular pacemaker has replaced Electronic atrial pacemaker Confirmed by Vonita Moss [id number]) on 04/25/2023 5:10:38 PM  Radiology No results found.  Procedures Procedures  {Document cardiac monitor, telemetry assessment procedure when appropriate:1}  Medications Ordered in ED Medications - No data to display  ED Course/ Medical Decision Making/ A&P   {   Click here for ABCD2, HEART and other calculatorsREFRESH Note before signing :1}                              Medical Decision Making Amount and/or Complexity of Data Reviewed Labs: ordered. Radiology: ordered.   ***  {Document critical care time when appropriate:1} {Document review of labs and clinical decision tools ie heart score, Chads2Vasc2  etc:1}  {Document your independent review of radiology images, and any outside records:1} {Document your discussion with family members, caretakers, and with consultants:1} {Document social determinants of health affecting pt's care:1} {Document your decision making why or why not admission, treatments were needed:1} Final Clinical Impression(s) / ED Diagnoses Final diagnoses:  None    Rx / DC Orders ED Discharge Orders     None

## 2023-04-26 ENCOUNTER — Encounter (HOSPITAL_COMMUNITY): Payer: Self-pay | Admitting: Family Medicine

## 2023-04-26 ENCOUNTER — Other Ambulatory Visit (HOSPITAL_COMMUNITY): Payer: Medicare Other

## 2023-04-26 DIAGNOSIS — Z794 Long term (current) use of insulin: Secondary | ICD-10-CM | POA: Diagnosis not present

## 2023-04-26 DIAGNOSIS — Z1152 Encounter for screening for COVID-19: Secondary | ICD-10-CM | POA: Diagnosis not present

## 2023-04-26 DIAGNOSIS — E118 Type 2 diabetes mellitus with unspecified complications: Secondary | ICD-10-CM

## 2023-04-26 DIAGNOSIS — R509 Fever, unspecified: Secondary | ICD-10-CM | POA: Diagnosis present

## 2023-04-26 DIAGNOSIS — Z7982 Long term (current) use of aspirin: Secondary | ICD-10-CM | POA: Diagnosis not present

## 2023-04-26 DIAGNOSIS — E785 Hyperlipidemia, unspecified: Secondary | ICD-10-CM | POA: Diagnosis not present

## 2023-04-26 DIAGNOSIS — R55 Syncope and collapse: Secondary | ICD-10-CM | POA: Diagnosis not present

## 2023-04-26 DIAGNOSIS — Z950 Presence of cardiac pacemaker: Secondary | ICD-10-CM | POA: Diagnosis not present

## 2023-04-26 DIAGNOSIS — F17210 Nicotine dependence, cigarettes, uncomplicated: Secondary | ICD-10-CM | POA: Diagnosis not present

## 2023-04-26 DIAGNOSIS — I2510 Atherosclerotic heart disease of native coronary artery without angina pectoris: Secondary | ICD-10-CM

## 2023-04-26 DIAGNOSIS — E871 Hypo-osmolality and hyponatremia: Secondary | ICD-10-CM | POA: Diagnosis not present

## 2023-04-26 DIAGNOSIS — J189 Pneumonia, unspecified organism: Secondary | ICD-10-CM | POA: Diagnosis not present

## 2023-04-26 DIAGNOSIS — E1169 Type 2 diabetes mellitus with other specified complication: Secondary | ICD-10-CM

## 2023-04-26 DIAGNOSIS — Z7984 Long term (current) use of oral hypoglycemic drugs: Secondary | ICD-10-CM | POA: Diagnosis not present

## 2023-04-26 DIAGNOSIS — Z79899 Other long term (current) drug therapy: Secondary | ICD-10-CM | POA: Diagnosis not present

## 2023-04-26 LAB — GLUCOSE, CAPILLARY
Glucose-Capillary: 186 mg/dL — ABNORMAL HIGH (ref 70–99)
Glucose-Capillary: 240 mg/dL — ABNORMAL HIGH (ref 70–99)
Glucose-Capillary: 279 mg/dL — ABNORMAL HIGH (ref 70–99)

## 2023-04-26 LAB — CBG MONITORING, ED: Glucose-Capillary: 169 mg/dL — ABNORMAL HIGH (ref 70–99)

## 2023-04-26 MED ORDER — INSULIN GLARGINE-YFGN 100 UNIT/ML ~~LOC~~ SOLN
38.0000 [IU] | Freq: Every day | SUBCUTANEOUS | Status: DC
  Administered 2023-04-26: 38 [IU] via SUBCUTANEOUS
  Filled 2023-04-26 (×2): qty 0.38

## 2023-04-26 MED ORDER — ASPIRIN 81 MG PO TBEC
81.0000 mg | DELAYED_RELEASE_TABLET | Freq: Every day | ORAL | Status: DC
  Administered 2023-04-26 – 2023-04-27 (×2): 81 mg via ORAL
  Filled 2023-04-26 (×3): qty 1

## 2023-04-26 MED ORDER — ONDANSETRON HCL 4 MG/2ML IJ SOLN: 4.0000 mg | Freq: Four times a day (QID) | INTRAMUSCULAR | Status: DC | PRN

## 2023-04-26 MED ORDER — KETOROLAC TROMETHAMINE 15 MG/ML IJ SOLN
15.0000 mg | Freq: Once | INTRAMUSCULAR | Status: AC
  Administered 2023-04-26: 15 mg via INTRAVENOUS
  Filled 2023-04-26: qty 1

## 2023-04-26 MED ORDER — GUAIFENESIN-DM 100-10 MG/5ML PO SYRP: 5.0000 mL | ORAL_SOLUTION | ORAL | Status: DC | PRN

## 2023-04-26 MED ORDER — ENOXAPARIN SODIUM 40 MG/0.4ML IJ SOSY
40.0000 mg | PREFILLED_SYRINGE | INTRAMUSCULAR | Status: DC
  Administered 2023-04-26 – 2023-04-27 (×2): 40 mg via SUBCUTANEOUS
  Filled 2023-04-26 (×3): qty 0.4

## 2023-04-26 MED ORDER — GABAPENTIN 300 MG PO CAPS
600.0000 mg | ORAL_CAPSULE | Freq: Two times a day (BID) | ORAL | Status: DC
  Administered 2023-04-26 – 2023-04-27 (×3): 600 mg via ORAL
  Filled 2023-04-26 (×3): qty 2

## 2023-04-26 MED ORDER — ONDANSETRON HCL 4 MG PO TABS: 4.0000 mg | ORAL_TABLET | Freq: Four times a day (QID) | ORAL | Status: DC | PRN

## 2023-04-26 MED ORDER — SODIUM CHLORIDE 0.9 % IV SOLN
2.0000 g | INTRAVENOUS | Status: DC
  Administered 2023-04-26: 2 g via INTRAVENOUS
  Filled 2023-04-26: qty 20

## 2023-04-26 MED ORDER — INSULIN ASPART 100 UNIT/ML IJ SOLN
0.0000 [IU] | Freq: Three times a day (TID) | INTRAMUSCULAR | Status: DC
  Administered 2023-04-26: 3 [IU] via SUBCUTANEOUS
  Administered 2023-04-26: 5 [IU] via SUBCUTANEOUS
  Administered 2023-04-26: 3 [IU] via SUBCUTANEOUS
  Administered 2023-04-27: 8 [IU] via SUBCUTANEOUS
  Administered 2023-04-27: 3 [IU] via SUBCUTANEOUS

## 2023-04-26 MED ORDER — PRAVASTATIN SODIUM 40 MG PO TABS
40.0000 mg | ORAL_TABLET | Freq: Every day | ORAL | Status: DC
  Administered 2023-04-26: 40 mg via ORAL
  Filled 2023-04-26: qty 1

## 2023-04-26 MED ORDER — ACETAMINOPHEN 325 MG PO TABS
650.0000 mg | ORAL_TABLET | Freq: Four times a day (QID) | ORAL | Status: DC | PRN
  Administered 2023-04-26: 650 mg via ORAL
  Filled 2023-04-26: qty 2

## 2023-04-26 MED ORDER — SODIUM CHLORIDE 0.9 % IV SOLN
500.0000 mg | INTRAVENOUS | Status: DC
  Administered 2023-04-26: 500 mg via INTRAVENOUS
  Filled 2023-04-26: qty 5

## 2023-04-26 MED ORDER — ACETAMINOPHEN 650 MG RE SUPP: 650.0000 mg | Freq: Four times a day (QID) | RECTAL | Status: DC | PRN

## 2023-04-26 NOTE — Assessment & Plan Note (Signed)
Mild, asymptomatic, with leukopenia. 

## 2023-04-26 NOTE — ED Notes (Signed)
Thomas with cl called for transport

## 2023-04-26 NOTE — Assessment & Plan Note (Signed)
Continue aspirin and pravastatin

## 2023-04-26 NOTE — ED Notes (Signed)
Handoff report given to [facility]link and to [employer] on 2W at [facility]

## 2023-04-26 NOTE — H&P (Addendum)
History and Physical    Patient: Carl Barnes ZOX:[id number] DOB: 05-23-1951 DOA: 04/25/2023 DOS: the patient was seen and examined on 04/26/2023 PCP: Joaquim Nam, MD  Patient coming from: Home  Chief Complaint:  Chief Complaint  Patient presents with   Dizziness   Generalized Body Aches       HPI:  [name] is a 72 y.o. 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  3 days PTA, patient developed fever, aches, diarrhea.  This progressed and he came to the ER.  In the ER, COVID-, WBC normal.  UA clear but CXR with left base opacity.    Given concern for recent PPM infection, case discussed with Cardiology who recommended blood cultures.      Review of Systems  Constitutional:  Positive for chills, fever and malaise/fatigue.  HENT:  Negative for sore throat.   Respiratory:  Positive for cough.   Gastrointestinal:  Negative for abdominal pain and vomiting.  Genitourinary:  Negative for dysuria, flank pain, frequency and urgency.  Musculoskeletal:  Positive for joint pain and myalgias.  Skin:  Negative for rash.  Neurological:  Positive for dizziness.  All other systems reviewed and are negative.    Past Medical History:  Diagnosis Date   Arthritis    Cholangitis 07/2010   [facility] admission - ERCP for stone removal per GI   Diabetes mellitus without complication (HCC)    Elevated glucose 10/16 to 06/09/2004   [facility] admission, mild elevated cholesterol   GERD (gastroesophageal reflux disease)    History of echocardiogram    a. 05/2017 Echo: EF 55-60%, mild LVH, no rwma, mild MR.   Hyperlipidemia 12/21/1997   Non-obstructive CAD (coronary artery disease)    a. 05/2016 Abnl St Test: intermediate risk, large, partially reversible inf/infsept defect consistent w/ ischemia/? scar;  b. 06/2016 Cath: LM nl, LAD 53m, RI nl, LCX nl, OM1/2/3 nl, RCA 40p/m, 30d, RPDA/RPAV nl.   Poisoning by black widow spider bite 1979   RBBB    Renal  stones    Syncope    Tobacco abuse    Past Surgical History:  Procedure Laterality Date   CARDIAC CATHETERIZATION Left 07/06/2016   Procedure: Left Heart Cath and Coronary Angiography;  Surgeon: Yvonne Kendall, MD;  Location: [facility] INVASIVE CV LAB;  Service: Cardiovascular;  Laterality: Left;   Cardiolite  06/08/2004   WNL   CATARACT EXTRACTION W/ INTRAOCULAR LENS IMPLANT Right 10/13/2017   Dr. Burgess Estelle   CATARACT EXTRACTION W/ INTRAOCULAR LENS IMPLANT Left 09/08/2017   Dr.Tanner   Chest CT w/contrast  06/08/2004   Negative PE   CHOLECYSTECTOMY  01/2011   Diseased GB.  [facility]   EYE SURGERY     Head MRI  07/1998   Normal (R/O acoustic neuroma)   PACEMAKER IMPLANT N/A 03/07/2023   Procedure: PACEMAKER IMPLANT;  Surgeon: Lanier Prude, MD;  Location: [facility] INVASIVE CV LAB;  Service: Cardiovascular;  Laterality: N/A;   Social History:  reports that he has been smoking cigarettes. He has a 17.5 pack-year smoking history. He has never used smokeless tobacco. He reports current alcohol use. He reports that he does not use drugs.  Allergies  Allergen Reactions   Chantix [Varenicline] Other (See Comments)    Intolerant    Codeine Other (See Comments)    GI upset   Metformin And Related Diarrhea    Max dose 500mg -1000mg  a day    Family History  Problem Relation  Age of Onset   Cancer Mother        Pancreatic   Hyperlipidemia Mother    CVA Father    COPD Father    Hearing loss Father    Stroke Father    Stroke Brother    Heart disease Brother    Heart disease Paternal Grandfather        MI   Diabetes Neg Hx    Alcohol abuse Neg Hx    Drug abuse Neg Hx    Depression Neg Hx    Colon cancer Neg Hx    Prostate cancer Neg Hx     Prior to Admission medications   Medication Sig Start Date End Date Taking? Authorizing Provider  acetaminophen (TYLENOL) 650 MG CR tablet Take 1,300 mg by mouth daily as needed for pain or fever.   Yes [provider]  aspirin EC 81 MG  tablet Take 1 tablet (81 mg total) by mouth daily. 07/12/18  Yes End, Cristal Deer, MD  cetirizine (ZYRTEC) 10 MG tablet Take 10 mg by mouth daily as needed for allergies.   Yes [provider]  cyanocobalamin (VITAMIN B12) 1000 MCG/ML injection 1000 mcg injected monthly 10/05/22  Yes Joaquim Nam, MD  gabapentin (NEURONTIN) 600 MG tablet Take 600 mg by mouth in the morning and at bedtime.   Yes [provider]  insulin glargine (LANTUS SOLOSTAR) 100 UNIT/ML Solostar Pen INJECT 38 UNITS UNDER THE SKIN ONCE DAILY Patient taking differently: Inject 32 Units into the skin at bedtime. INJECT 38 UNITS UNDER THE SKIN ONCE DAILY 01/03/23  Yes Joaquim Nam, MD  metFORMIN (GLUCOPHAGE) 500 MG tablet Take 1 tablet (500 mg total) by mouth at bedtime. 01/03/23  Yes Joaquim Nam, MD  pravastatin (PRAVACHOL) 40 MG tablet TAKE ONE TABLET BY MOUTH EVERY NIGHT AT BEDTIME 12/31/22  Yes Joaquim Nam, MD  ACCU-CHEK AVIVA PLUS test strip CHECK SUGAR DAILY UP TO 3 TIMES DAILY. INSULIN TREATED DM2 12/12/18   Joaquim Nam, MD  B-D UF III MINI PEN NEEDLES 31G X 5 MM MISC USE DAILY WITH INSULIN PEN 06/15/22   Joaquim Nam, MD  SYRINGE-NEEDLE, DISP, 3 ML 25G X 1" 3 ML MISC Use with B12 injection 07/07/22   Joaquim Nam, MD    Physical Exam: Vitals:   04/26/23 0259 04/26/23 0616 04/26/23 0735 04/26/23 1029  BP:  (!) 99/55 103/64 116/70  Pulse:  60 (!) 59 64  Resp:  18 15   Temp: (!) 100.4 F (38 C) 98.6 F (37 C)  97.8 F (36.6 C)  TempSrc: Oral Oral  Oral  SpO2:  95% 96% 97%  Weight:      Height:       General appearance: Adult male, sitting up in bed alert and in no distress.  Coughs occasionally.  Responds appropriately to questions.  Eye contact, dress and hygiene appropriate. HEENT:  Anicteric, conjunctivae and sclerae normal without injection or icterus, lids and lashes normal.  Visual tracking smooth.  OP moist without lesions, partially edentulous, lips normal, normal  auditory acuity   Cor:  RRR, without murmurs, rubs.  JVP normal, no LE edema Resp:  Normal respiratory rate and rhythm.  CTAB without rales or wheezes.  Coughs periodically Abd:  No TTP or rebound all quadrants.  No masses or organomegaly.   No ascites, distension.  MSK: Symmetrical without gross deformities of the hands, large joints, or legs. Skin:  cap refill normal, Skin intact without significant  rashes or lesions. Neuro:  Speech is fluent.  Naming is grossly intact, patient's recall, both recent and remote, seem within normal limits.  Muscle tone normal, face symmetric  Psych:  Attention span and concentration are within normal limits.  Affect normal.  appropriate thought content and normal rate of speech, thought process linear           Data Reviewed: Basic metabolic panel shows mild hyponatremia, hyperglycemia. Sed rate normal, CRP slightly elevated CBC unremarkable COVID-negative Chest x-ray, personally reviewed, no significant findings.  Radiology report suggests left base opacity.   EKG paced, personally reviewed.    Assessment and Plan: * Fever Suspected community acquired pneumonia of left lower lobe PSI 83 but feels well.  Cardiology have asked that he be observed overnight due to ?bacteremia and recent PPM. - Follow blood cultures -Continue Rocephin and azithromycin - Check Legionella and strep antigens - Obtain sputum culture - Obtain echo    Hyponatremia Mild, asymptomatic.  Type 2 diabetes mellitus with complication, with long-term current use of insulin (HCC) Blood sugars somewhat elevated on admission, improved overnight - Continue home glargine - Continue sliding scale corrections - Continue gabapentin for diabetes related polyneuropathy  CAD in native artery - Continue aspirin and pravastatin  Syncope Sinus pauses Recent Pacemaker implantation I will notify cardiology although I have low suspicion for pacemaker infection or bloodstream  infection.  Hyperlipidemia associated with type 2 diabetes mellitus (HCC) - Continue pravastatin          Advance Care Planning: FULL  Consults: None  Family Communication: None  Severity of Illness: The appropriate patient status for this patient is OBSERVATION. Observation status is judged to be reasonable and necessary in order to provide the required intensity of service to ensure the patient's safety. The patient's presenting symptoms, physical exam findings, and initial radiographic and laboratory data in the context of their medical condition is felt to place them at decreased risk for further clinical deterioration. Furthermore, it is anticipated that the patient will be medically stable for discharge from the hospital within 2 midnights of admission.   Author: Alberteen Sam, MD 04/26/2023 11:00 AM  For on call review [email or web address].

## 2023-04-26 NOTE — Assessment & Plan Note (Signed)
Continue pravastatin 

## 2023-04-26 NOTE — Assessment & Plan Note (Addendum)
Suspected community acquired pneumonia of left lower lobe PSI 83 but feels well.  Cardiology have asked that he be observed overnight due to ?bacteremia and recent PPM. - Follow blood cultures -Continue Rocephin and azithromycin - Check Legionella and strep antigens - Obtain sputum culture - Obtain echo

## 2023-04-26 NOTE — Assessment & Plan Note (Signed)
Blood sugars somewhat elevated on admission, improved overnight - Continue home glargine - Continue sliding scale corrections - Continue gabapentin for diabetes related polyneuropathy

## 2023-04-26 NOTE — Assessment & Plan Note (Addendum)
Sinus pauses Recent Pacemaker implantation I will notify cardiology although I have low suspicion for pacemaker infection or bloodstream infection.

## 2023-04-26 NOTE — Hospital Course (Addendum)
Mr. Umpierre is a 72 y.o. 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  3 days PTA, patient developed fever, aches, diarrhea.  This progressed and he came to the ER.  In the ER, COVID-, WBC normal.  UA clear but CXR with left base opacity.    Given concern for recent PPM infection, case discussed with Cardiology who recommended blood cultures.

## 2023-04-27 ENCOUNTER — Other Ambulatory Visit (HOSPITAL_COMMUNITY): Payer: Self-pay

## 2023-04-27 ENCOUNTER — Observation Stay (HOSPITAL_BASED_OUTPATIENT_CLINIC_OR_DEPARTMENT_OTHER): Payer: Medicare Other

## 2023-04-27 DIAGNOSIS — R509 Fever, unspecified: Secondary | ICD-10-CM

## 2023-04-27 LAB — ECHOCARDIOGRAM COMPLETE
Area-P 1/2: 2.51 cm2
Calc EF: 60.5 %
Height: 71 in
MV M vel: 3.86 m/s
MV Peak grad: 59.6 mmHg
MV VTI: 2.56 cm2
S' Lateral: 3.4 cm
Single Plane A2C EF: 58.3 %
Single Plane A4C EF: 61.2 %
Weight: 3200 [oz_av]

## 2023-04-27 LAB — GLUCOSE, CAPILLARY
Glucose-Capillary: 208 mg/dL — ABNORMAL HIGH (ref 70–99)
Glucose-Capillary: 168 mg/dL — ABNORMAL HIGH (ref 70–99)
Glucose-Capillary: 184 mg/dL — ABNORMAL HIGH (ref 70–99)
Glucose-Capillary: 255 mg/dL — ABNORMAL HIGH (ref 70–99)
Glucose-Capillary: 260 mg/dL — ABNORMAL HIGH (ref 70–99)

## 2023-04-27 MED ORDER — AZITHROMYCIN 500 MG PO TABS
500.0000 mg | ORAL_TABLET | Freq: Every day | ORAL | Status: DC
  Administered 2023-04-27: 500 mg via ORAL
  Filled 2023-04-27: qty 1

## 2023-04-27 MED ORDER — CEFDINIR 300 MG PO CAPS
300.0000 mg | ORAL_CAPSULE | Freq: Two times a day (BID) | ORAL | 0 refills | Status: DC
  Filled 2023-04-27: qty 10, 5d supply, fill #0

## 2023-04-27 MED ORDER — AZITHROMYCIN 500 MG PO TABS
500.0000 mg | ORAL_TABLET | Freq: Every day | ORAL | 0 refills | Status: DC
  Filled 2023-04-27: qty 2, 2d supply, fill #0

## 2023-04-27 NOTE — Progress Notes (Signed)
PHARMACIST - PHYSICIAN COMMUNICATION DR:   Maryfrances Bunnell CONCERNING: Antibiotic IV to Oral Route Change Policy  RECOMMENDATION: This patient is receiving azithromycin by the intravenous route.  Based on criteria approved by the Pharmacy and Therapeutics Committee, the antibiotic(s) is/are being converted to the equivalent oral dose form(s).   DESCRIPTION: These criteria include: Patient being treated for a respiratory tract infection, urinary tract infection, cellulitis or clostridium difficile associated diarrhea if on metronidazole The patient is not neutropenic and does not exhibit a GI malabsorption state The patient is eating (either orally or via tube) and/or has been taking other orally administered medications for a least 24 hours The patient is improving clinically and has a Tmax < 100.5  If you have questions about this conversion, please contact the Pharmacy Department  []   [phone] )  [facility] []   [phone] )  [facility] [x]   [phone] )  [facility] []   [phone] )  [facility] []   [phone] )  [facility]

## 2023-04-27 NOTE — Progress Notes (Signed)
  Echocardiogram 2D Echocardiogram has been performed.  Milda Smart 04/27/2023, 4:00 PM

## 2023-04-27 NOTE — Care Management Obs Status (Signed)
MEDICARE OBSERVATION STATUS NOTIFICATION   Patient Details  Name: Carl Barnes MRN: [id number] Date of Birth: Oct 08, 1950   Medicare Observation Status Notification Given:  Yes    Lawerance Sabal, RN 04/27/2023, 9:02 AM

## 2023-04-27 NOTE — Discharge Summary (Signed)
Physician Discharge Summary   Patient: Carl Barnes MRN: [id number] DOB: Mar 03, 1951  Admit date:     04/25/2023  Discharge date: 04/27/23  Discharge Physician: Alberteen Sam   PCP: Joaquim Nam, MD     Recommendations at discharge:  Follow up with PCP Dr. Para March in 1 week for suspected fever Defer repeat CXR to Dr. Para March Follow up with Electrophysiology as previously planned -- suspicion for pacemaker lead infection is low     Discharge Diagnoses: Principal Problem:   Community acquired pneumonia, left lower lobe Active Problems:   Hyperlipidemia associated with type 2 diabetes mellitus (HCC)   CAD in native artery   Type 2 diabetes mellitus with complication, with long-term current use of insulin (HCC)   Hyponatremia     Hospital Course: Carl Barnes is a 72 y.o. 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  3 days PTA, patient developed fever, aches, diarrhea.  This progressed and he came to the ER.  In the ER, COVID-, WBC normal.  UA clear but CXR with left base opacity.    Given concern for recent PPM infection, case discussed with Cardiology who recommended blood cultures and echo and observation.      * Fever Suspected community acquired pneumonia of left lower lobe Patient admitted and started on antibitoics.  Cultures negative at 48 hours.    Echo performed, no vegetation visible.  Overall, clinical suspicion for bacteremia is low.  Will allow cultures to mature for 5 days, but safe for discharge home, has good follow up.  Patient now mentating at baseline, taking orals.  Temp < 100 F, heart rate < 100bpm, RR < 24, SpO2 at baseline.   Stable for discharge.                 The [facility] Controlled Substances Registry was reviewed for this patient prior to discharge.   Consultants: None Procedures performed: Echocardiogram  Disposition: Home   DISCHARGE MEDICATION: Allergies as of  04/27/2023       Reactions   Chantix [varenicline] Other (See Comments)   Intolerant   Codeine Other (See Comments)   GI upset   Metformin And Related Diarrhea   Max dose 500mg -1000mg  a day        Medication List     TAKE these medications    Accu-Chek Aviva Plus test strip Generic drug: glucose blood CHECK SUGAR DAILY UP TO 3 TIMES DAILY. INSULIN TREATED DM2   acetaminophen 650 MG CR tablet Commonly known as: TYLENOL Take 1,300 mg by mouth daily as needed for pain or fever.   aspirin EC 81 MG tablet Take 1 tablet (81 mg total) by mouth daily.   azithromycin 500 MG tablet Commonly known as: ZITHROMAX Take 1 tablet (500 mg total) by mouth daily. Start taking on: April 28, 2023   B-D UF III MINI PEN NEEDLES 31G X 5 MM Misc Generic drug: Insulin Pen Needle USE DAILY WITH INSULIN PEN   cefdinir 300 MG capsule Commonly known as: OMNICEF Take 1 capsule (300 mg total) by mouth 2 (two) times daily.   cetirizine 10 MG tablet Commonly known as: ZYRTEC Take 10 mg by mouth daily as needed for allergies.   cyanocobalamin 1000 MCG/ML injection Commonly known as: VITAMIN B12 1000 mcg injected monthly   gabapentin 600 MG tablet Commonly known as: NEURONTIN Take 600 mg by mouth in the morning and at bedtime.   Lantus SoloStar  100 UNIT/ML Solostar Pen Generic drug: insulin glargine INJECT 38 UNITS UNDER THE SKIN ONCE DAILY What changed:  how much to take how to take this when to take this   metFORMIN 500 MG tablet Commonly known as: GLUCOPHAGE Take 1 tablet (500 mg total) by mouth at bedtime.   pravastatin 40 MG tablet Commonly known as: PRAVACHOL TAKE ONE TABLET BY MOUTH EVERY NIGHT AT BEDTIME   SYRINGE-NEEDLE (DISP) 3 ML 25G X 1" 3 ML Misc Use with B12 injection        Follow-up Information     Joaquim Nam, MD. Schedule an appointment as soon as possible for a visit in 1 week(s).   Specialty: Family Medicine Contact information: [address] [place] [address] [phone]                 Discharge Instructions     Discharge instructions   Complete by: As directed    **IMPORTANT DISCHARGE INSTRUCTIONS**   From Dr. Maryfrances Bunnell: You were admitted for a pneumonia.  Here, you were treated with azithromycin and ceftriaxone  You should complete treatment with a few more days antibiotics.  Take azithromycin tablets 500 mg daily for 2 more days (starting Thursday morning)  Take cefdinir/Omnicef 300 mg twice daily for 5 more days (starting Wednesday night)  Cefdinir doesn't last in the system as long as Azithromycin, so that's why you take it for a longer period of time.  Use the incentive and flutter at home  If you have cough, take over the counter robitussin, containing dextromethorphan and guiafenesin.  Go see Dr. Para March in 1 week for a check up. I will let Dr. Lalla Brothers know everything turned out okay   Increase activity slowly   Complete by: As directed        Discharge Exam: Filed Weights   04/25/23 1639  Weight: 90.7 kg    General: Pt is alert, awake, not in acute distress Cardiovascular: RRR, nl S1-S2, no murmurs appreciated.   No LE edema.   Respiratory: Normal respiratory rate and rhythm.  CTAB without rales or wheezes. Abdominal: Abdomen soft and non-tender.  No distension or HSM.   Neuro/Psych: Strength symmetric in upper and lower extremities.  Judgment and insight appear normal.   Condition at discharge: good  The results of significant diagnostics from this hospitalization (including imaging, microbiology, ancillary and laboratory) are listed below for reference.   Imaging Studies: ECHOCARDIOGRAM COMPLETE  Result Date: 04/27/2023    ECHOCARDIOGRAM REPORT   Patient Name:   Carl Barnes Date of Exam: 04/27/2023 Medical Rec #:  [id number]      Height:       71.0 in Accession #:    [id number]     Weight:       200.0 lb Date of Birth:  11-10-1950      BSA:          2.109 m Patient  Age:    72 years       BP:           120/70 mmHg Patient Gender: M              HR:           60 bpm. Exam Location:  Inpatient Procedure: 2D Echo Indications:    Fever R50.9  History:        Patient has prior history of Echocardiogram examinations, most  recent 03/06/2023.  Sonographer:    Milda Smart Referring Phys: [id number] Yuepheng Schaller P Tyrell Brereton IMPRESSIONS  1. Left ventricular ejection fraction, by estimation, is 60 to 65%. The left ventricle has normal function. The left ventricle has no regional wall motion abnormalities. Left ventricular diastolic parameters are consistent with Grade I diastolic dysfunction (impaired relaxation).  2. Right ventricular systolic function is normal. The right ventricular size is normal. There is normal pulmonary artery systolic pressure. The estimated right ventricular systolic pressure is 17.1 mmHg.  3. The mitral valve is normal in structure. Trivial mitral valve regurgitation.  4. The aortic valve is tricuspid. There is mild calcification of the aortic valve. Aortic valve regurgitation is not visualized.  5. The inferior vena cava is normal in size with greater than 50% respiratory variability, suggesting right atrial pressure of 3 mmHg. Conclusion(s)/Recommendation(s): No evidence of valvular vegetations on this transthoracic echocardiogram. Consider a transesophageal echocardiogram to exclude infective endocarditis if clinically indicated. FINDINGS  Left Ventricle: Left ventricular ejection fraction, by estimation, is 60 to 65%. The left ventricle has normal function. The left ventricle has no regional wall motion abnormalities. The left ventricular internal cavity size was normal in size. There is  no left ventricular hypertrophy. Left ventricular diastolic parameters are consistent with Grade I diastolic dysfunction (impaired relaxation). Right Ventricle: The right ventricular size is normal. Right ventricular systolic function is normal. There is normal  pulmonary artery systolic pressure. The tricuspid regurgitant velocity is 1.88 m/s, and with an assumed right atrial pressure of 3 mmHg,  the estimated right ventricular systolic pressure is 17.1 mmHg. Left Atrium: Left atrial size was normal in size. Right Atrium: Right atrial size was normal in size. Pericardium: There is no evidence of pericardial effusion. Mitral Valve: The mitral valve is normal in structure. Trivial mitral valve regurgitation. MV peak gradient, 5.3 mmHg. The mean mitral valve gradient is 2.0 mmHg. Tricuspid Valve: Tricuspid valve regurgitation is trivial. Aortic Valve: The aortic valve is tricuspid. There is mild calcification of the aortic valve. Aortic valve regurgitation is not visualized. Pulmonic Valve: Pulmonic valve regurgitation is not visualized. Aorta: The aortic root and ascending aorta are structurally normal, with no evidence of dilitation. Venous: The inferior vena cava is normal in size with greater than 50% respiratory variability, suggesting right atrial pressure of 3 mmHg. IAS/Shunts: No atrial level shunt detected by color flow Doppler. Additional Comments: A venous catheter is visualized.  LEFT VENTRICLE PLAX 2D LVIDd:         5.30 cm      Diastology LVIDs:         3.40 cm      LV e' medial:    6.74 cm/s LV PW:         1.00 cm      LV E/e' medial:  15.3 LV IVS:        0.70 cm      LV e' lateral:   8.05 cm/s LVOT diam:     2.30 cm      LV E/e' lateral: 12.8 LV SV:         114 LV SV Index:   54 LVOT Area:     4.15 cm  LV Volumes (MOD) LV vol d, MOD A2C: 110.0 ml LV vol d, MOD A4C: 115.0 ml LV vol s, MOD A2C: 45.9 ml LV vol s, MOD A4C: 44.6 ml LV SV MOD A2C:     64.1 ml LV SV MOD A4C:     115.0 ml LV  SV MOD BP:      70.9 ml RIGHT VENTRICLE RV Basal diam:  3.60 cm RV S prime:     12.90 cm/s TAPSE (M-mode): 2.2 cm LEFT ATRIUM             Index        RIGHT ATRIUM           Index LA diam:        3.60 cm 1.71 cm/m   RA Area:     14.10 cm LA Vol (A2C):   38.4 ml 18.21 ml/m  RA  Volume:   33.40 ml  15.84 ml/m LA Vol (A4C):   37.2 ml 17.64 ml/m LA Biplane Vol: 38.9 ml 18.45 ml/m  AORTIC VALVE LVOT Vmax:   137.00 cm/s LVOT Vmean:  91.800 cm/s LVOT VTI:    0.274 m  AORTA Ao Root diam: 3.40 cm Ao Asc diam:  3.10 cm MITRAL VALVE                TRICUSPID VALVE MV Area (PHT): 2.51 cm     TR Peak grad:   14.1 mmHg MV Area VTI:   2.56 cm     TR Vmax:        188.00 cm/s MV Peak grad:  5.3 mmHg MV Mean grad:  2.0 mmHg     SHUNTS MV Vmax:       1.15 m/s     Systemic VTI:  0.27 m MV Vmean:      72.0 cm/s    Systemic Diam: 2.30 cm MV Decel Time: 302 msec MR Peak grad: 59.6 mmHg MR Vmax:      386.00 cm/s MV E velocity: 103.00 cm/s MV A velocity: 118.00 cm/s MV E/A ratio:  0.87 [place] signed by Carolan Clines Signature Date/Time: 04/27/2023/3:56:54 PM    Final    DG Chest 2 View  Result Date: 04/25/2023 CLINICAL DATA:  Fever, myalgias. EXAM: CHEST - 2 VIEW COMPARISON:  Chest radiograph dated 03/08/2023. FINDINGS: The heart size and mediastinal contours are within normal limits. There is minimal left basilar atelectasis/airspace disease. The right lung is clear. There is no pleural effusion or pneumothorax. Degenerative changes are seen in the spine. A left subclavian approach cardiac device is noted. IMPRESSION: Minimal left basilar atelectasis/airspace disease. Electronically Signed   By: Romona Curls M.D.   On: 04/25/2023 20:21   VAS US CAROTID  Result Date: 04/12/2023 Carotid Arterial Duplex Study Patient Name:  ENDY MAZZAFERRO  Date of Exam:   04/12/2023 Medical Rec #: [id number]       Accession #:    [id number] Date of Birth: [birth year]       Patient Gender: M Patient Age:   25 years Exam Location:  [facility] Procedure:      VAS US CAROTID Referring Phys: Tommie Ard FURTH --------------------------------------------------------------------------------  Indications:       Syncope. Comparison Study:  A carotid duplex exam on 06/09/16 showed RICA velocities of                     32/19 cm/s and LICA velocities of 86/31 cm/s Performing Technologist: Quentin Ore RDMS, RVT, RDCS  Examination Guidelines: A complete evaluation includes B-mode imaging, spectral Doppler, color Doppler, and power Doppler as needed of all accessible portions of each vessel. Bilateral testing is considered an integral part of a complete examination. Limited examinations for reoccurring indications may be performed as noted.  Right Carotid Findings: +----------+--------+--------+--------+------------------+--------+  PSV cm/sEDV cm/sStenosisPlaque DescriptionComments +----------+--------+--------+--------+------------------+--------+ CCA Prox  73      17                                         +----------+--------+--------+--------+------------------+--------+ CCA Distal51      14                                         +----------+--------+--------+--------+------------------+--------+ ICA Prox  39      10      Normal                             +----------+--------+--------+--------+------------------+--------+ ICA Mid   49      17                                         +----------+--------+--------+--------+------------------+--------+ ICA Distal91      31                                         +----------+--------+--------+--------+------------------+--------+ ECA       52      11                                         +----------+--------+--------+--------+------------------+--------+ +----------+--------+-------+----------------+-------------------+           PSV cm/sEDV cmsDescribe        Arm Pressure (mmHG) +----------+--------+-------+----------------+-------------------+ [id number]             Multiphasic, [id number]                 +----------+--------+-------+----------------+-------------------+ +---------+--------+--+--------+-+---------+ VertebralPSV cm/s34EDV cm/s8Antegrade +---------+--------+--+--------+-+---------+  Left  Carotid Findings: +----------+--------+--------+--------+------------------+--------+           PSV cm/sEDV cm/sStenosisPlaque DescriptionComments +----------+--------+--------+--------+------------------+--------+ CCA Prox  60      19                                         +----------+--------+--------+--------+------------------+--------+ CCA Distal52      13                                         +----------+--------+--------+--------+------------------+--------+ ICA Prox  43      13      Normal                             +----------+--------+--------+--------+------------------+--------+ ICA Mid   64      26                                         +----------+--------+--------+--------+------------------+--------+ ICA Distal74      23                                         +----------+--------+--------+--------+------------------+--------+  ECA       39      8                                          +----------+--------+--------+--------+------------------+--------+ +----------+--------+--------+----------------+-------------------+           PSV cm/sEDV cm/sDescribe        Arm Pressure (mmHG) +----------+--------+--------+----------------+-------------------+ [id number]              Multiphasic, [id number]                 +----------+--------+--------+----------------+-------------------+ +---------+--------+--+--------+--+---------+ VertebralPSV cm/s31EDV cm/s11Antegrade +---------+--------+--+--------+--+---------+   Summary: Right Carotid: There was no evidence of thrombus, dissection, atherosclerotic                plaque or stenosis in the cervical carotid system. Left Carotid: There was no evidence of thrombus, dissection, atherosclerotic               plaque or stenosis in the cervical carotid system. Vertebrals:  Bilateral vertebral arteries demonstrate antegrade flow. Subclavians: Normal flow hemodynamics were seen in bilateral  subclavian              arteries. *See table(s) above for measurements and observations.  Electronically signed by Charlton Haws MD on 04/12/2023 at 3:27:54 PM.    Final     Microbiology: Results for orders placed or performed during the hospital encounter of 04/25/23  SARS Coronavirus 2 by RT PCR (hospital order, performed in [facility] hospital lab) *cepheid single result test* Anterior Nasal Swab     Status: None   Collection Time: 04/25/23  4:42 PM   Specimen: Anterior Nasal Swab  Result Value Ref Range Status   SARS Coronavirus 2 by RT PCR NEGATIVE NEGATIVE Final    Comment: (NOTE) SARS-CoV-2 target nucleic acids are NOT DETECTED.  The SARS-CoV-2 RNA is generally detectable in upper and lower respiratory specimens during the acute phase of infection. The lowest concentration of SARS-CoV-2 viral copies this assay can detect is 250 copies / mL. A negative result does not preclude SARS-CoV-2 infection and should not be used as the sole basis for treatment or other patient management decisions.  A negative result may occur with improper specimen collection / handling, submission of specimen other than nasopharyngeal swab, presence of viral mutation(s) within the areas targeted by this assay, and inadequate number of viral copies (<250 copies / mL). A negative result must be combined with clinical observations, patient history, and epidemiological information.  Fact Sheet for Patients:   RoadLapTop.co.za  Fact Sheet for Healthcare Providers: http://kim-miller.com/  This test is not yet approved or  cleared by the [place] FDA and has been authorized for detection and/or diagnosis of SARS-CoV-2 by FDA under an Emergency Use Authorization (EUA).  This EUA will remain in effect (meaning this test can be used) for the duration of the COVID-19 declaration under Section 564(b)(1) of the Act, 21 U.S.C. section 360bbb-3(b)(1), unless the  authorization is terminated or revoked sooner.  Performed at [facility], [address], [place], [place] [address]   Blood culture (routine x 2)     Status: None (Preliminary result)   Collection Time: 04/25/23  8:23 PM   Specimen: BLOOD  Result Value Ref Range Status   Specimen Description BLOOD RIGHT ANTECUBITAL  Final   Special Requests   Final    BOTTLES DRAWN AEROBIC AND ANAEROBIC  Blood Culture adequate volume   Culture   Final    NO GROWTH 2 DAYS Performed at [facility], [address] [place], [place] [address]    Report Status PENDING  Incomplete  Blood culture (routine x 2)     Status: None (Preliminary result)   Collection Time: 04/25/23  8:32 PM   Specimen: BLOOD  Result Value Ref Range Status   Specimen Description   Final    BLOOD LEFT ANTECUBITAL Performed at [facility], [address] [place], [place] [address]    Special Requests   Final    BOTTLES DRAWN AEROBIC AND ANAEROBIC Blood Culture adequate volume Performed at [facility], [address] [place], [place] [address]    Culture   Final    NO GROWTH 2 DAYS Performed at [facility], [address] [place], [place] [address]    Report Status PENDING  Incomplete    Labs: CBC: Recent Labs  Lab 04/25/23 1642  WBC 7.6  HGB 15.1  HCT 42.7  MCV 91.2  PLT 117*   Basic Metabolic Panel: Recent Labs  Lab 04/25/23 1642  NA 133*  K 3.8  CL 102  CO2 22  GLUCOSE 230*  BUN 13  CREATININE 0.85  CALCIUM 8.7*   Liver Function Tests: No results for input(s): "AST", "ALT", "ALKPHOS", "BILITOT", "PROT", "ALBUMIN" in the last 168 hours. CBG: Recent Labs  Lab 04/27/23 0158 04/27/23 0517 04/27/23 0809 04/27/23 1141 04/27/23 1517  GLUCAP 208* 168* 184* 255* 260*    Discharge time spent: approximately 25 minutes spent on discharge counseling, evaluation of patient on day of discharge, and coordination of discharge planning with  nursing, social work, pharmacy and case management  Signed: Alberteen Sam, MD [facility] 04/27/2023

## 2023-04-27 NOTE — Plan of Care (Signed)
  Problem: Education: Goal: Ability to describe self-care measures that may prevent or decrease complications (Diabetes Survival Skills Education) will improve Outcome: Progressing Goal: Individualized Educational Video(s) Outcome: Progressing   Problem: Coping: Goal: Ability to adjust to condition or change in health will improve Outcome: Progressing   Problem: Fluid Volume: Goal: Ability to maintain a balanced intake and output will improve Outcome: Progressing   Problem: Health Behavior/Discharge Planning: Goal: Ability to identify and utilize available resources and services will improve Outcome: Progressing Goal: Ability to manage health-related needs will improve Outcome: Progressing   Problem: Metabolic: Goal: Ability to maintain appropriate glucose levels will improve Outcome: Progressing   Problem: Nutritional: Goal: Maintenance of adequate nutrition will improve Outcome: Progressing Goal: Progress toward achieving an optimal weight will improve Outcome: Progressing   Problem: Skin Integrity: Goal: Risk for impaired skin integrity will decrease Outcome: Progressing   Problem: Tissue Perfusion: Goal: Adequacy of tissue perfusion will improve Outcome: Progressing   Problem: Education: Goal: Knowledge of General Education information will improve Description: Including pain rating scale, medication(s)/side effects and non-pharmacologic comfort measures Outcome: Progressing   Problem: Health Behavior/Discharge Planning: Goal: Ability to manage health-related needs will improve Outcome: Progressing   Problem: Clinical Measurements: Goal: Ability to maintain clinical measurements within normal limits will improve Outcome: Progressing Goal: Will remain free from infection Outcome: Progressing Goal: Diagnostic test results will improve Outcome: Progressing Goal: Respiratory complications will improve Outcome: Progressing Goal: Cardiovascular complication will  be avoided Outcome: Progressing   Problem: Activity: Goal: Risk for activity intolerance will decrease Outcome: Progressing   Problem: Nutrition: Goal: Adequate nutrition will be maintained Outcome: Progressing   Problem: Coping: Goal: Level of anxiety will decrease Outcome: Progressing   Problem: Elimination: Goal: Will not experience complications related to bowel motility Outcome: Progressing Goal: Will not experience complications related to urinary retention Outcome: Progressing   Problem: Pain Managment: Goal: General experience of comfort will improve Outcome: Progressing   Problem: Safety: Goal: Ability to remain free from injury will improve Outcome: Progressing   Problem: Skin Integrity: Goal: Risk for impaired skin integrity will decrease Outcome: Progressing   Problem: Activity: Goal: Ability to tolerate increased activity will improve Outcome: Progressing   Problem: Clinical Measurements: Goal: Ability to maintain a body temperature in the normal range will improve Outcome: Progressing   Problem: Respiratory: Goal: Ability to maintain adequate ventilation will improve Outcome: Progressing Goal: Ability to maintain a clear airway will improve Outcome: Progressing   

## 2023-04-29 ENCOUNTER — Telehealth: Payer: Self-pay

## 2023-04-29 NOTE — Transitions of Care (Post Inpatient/ED Visit) (Unsigned)
   04/29/2023  Name: MARQUEE WAVRA MRN: [id number] DOB: [birth year]  Today's TOC FU Call Status: Today's TOC FU Call Status:: Unsuccessful Call (1st Attempt) Unsuccessful Call (1st Attempt) Date: 04/29/23  Attempted to reach the patient regarding the most recent Inpatient/ED visit.  Follow Up Plan: Additional outreach attempts will be made to reach the patient to complete the Transitions of Care (Post Inpatient/ED visit) call.   Signature Kandis Fantasia, LPN [facility] Advisor [place] [facility] You Are. We Are. [facility] Direct Dial [phone]

## 2023-04-30 LAB — CULTURE, BLOOD (ROUTINE X 2)
Culture: NO GROWTH
Culture: NO GROWTH
Special Requests: ADEQUATE
Special Requests: ADEQUATE

## 2023-05-09 ENCOUNTER — Encounter: Payer: Self-pay | Admitting: Family Medicine

## 2023-05-09 ENCOUNTER — Ambulatory Visit: Payer: Medicare Other | Admitting: Family Medicine

## 2023-05-09 VITALS — BP 110/64 | HR 81 | Temp 97.7°F | Ht 71.0 in | Wt 193.0 lb

## 2023-05-09 DIAGNOSIS — Z23 Encounter for immunization: Secondary | ICD-10-CM | POA: Diagnosis not present

## 2023-05-09 DIAGNOSIS — Z794 Long term (current) use of insulin: Secondary | ICD-10-CM

## 2023-05-09 DIAGNOSIS — E1169 Type 2 diabetes mellitus with other specified complication: Secondary | ICD-10-CM

## 2023-05-09 DIAGNOSIS — J189 Pneumonia, unspecified organism: Secondary | ICD-10-CM | POA: Diagnosis not present

## 2023-05-09 LAB — BASIC METABOLIC PANEL WITH GFR
BUN: 12 mg/dL (ref 6–23)
CO2: 25 meq/L (ref 19–32)
Calcium: 9.4 mg/dL (ref 8.4–10.5)
Chloride: 104 meq/L (ref 96–112)
Creatinine, Ser: 0.82 mg/dL (ref 0.40–1.50)
GFR: 87.4 mL/min
Glucose, Bld: 246 mg/dL — ABNORMAL HIGH (ref 70–99)
Potassium: 4.6 meq/L (ref 3.5–5.1)
Sodium: 137 meq/L (ref 135–145)

## 2023-05-09 LAB — CBC WITH DIFFERENTIAL/PLATELET
Basophils Absolute: 0.1 K/uL (ref 0.0–0.1)
Basophils Relative: 1.1 % (ref 0.0–3.0)
Eosinophils Absolute: 0.2 K/uL (ref 0.0–0.7)
Eosinophils Relative: 3.3 % (ref 0.0–5.0)
HCT: 45 % (ref 39.0–52.0)
Hemoglobin: 15.1 g/dL (ref 13.0–17.0)
Lymphocytes Relative: 23.2 % (ref 12.0–46.0)
Lymphs Abs: 1.6 K/uL (ref 0.7–4.0)
MCHC: 33.5 g/dL (ref 30.0–36.0)
MCV: 94.9 fl (ref 78.0–100.0)
Monocytes Absolute: 0.6 K/uL (ref 0.1–1.0)
Monocytes Relative: 8.5 % (ref 3.0–12.0)
Neutro Abs: 4.3 K/uL (ref 1.4–7.7)
Neutrophils Relative %: 63.9 % (ref 43.0–77.0)
Platelets: 159 K/uL (ref 150.0–400.0)
RBC: 4.74 Mil/uL (ref 4.22–5.81)
RDW: 13.3 % (ref 11.5–15.5)
WBC: 6.8 K/uL (ref 4.0–10.5)

## 2023-05-09 LAB — HEMOGLOBIN A1C: Hgb A1c MFr Bld: 9.7 % — ABNORMAL HIGH (ref 4.6–6.5)

## 2023-05-09 NOTE — Progress Notes (Unsigned)
    Recommendations at discharge:  Follow up with PCP Dr. Para March in 1 week for suspected fever Defer repeat CXR to Dr. Para March Follow up with Electrophysiology as previously planned -- suspicion for pacemaker lead infection is low         Discharge Diagnoses: Principal Problem:   Community acquired pneumonia, left lower lobe Active Problems:   Hyperlipidemia associated with type 2 diabetes mellitus (HCC)   CAD in native artery   Type 2 diabetes mellitus with complication, with long-term current use of insulin (HCC)   Hyponatremia         Hospital Course: Mr. Sidener is a 72 y.o. 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   3 days PTA, patient developed fever, aches, diarrhea.  This progressed and he came to the ER.  In the ER, COVID-, WBC normal.  UA clear but CXR with left base opacity.     Given concern for recent PPM infection, case discussed with Cardiology who recommended blood cultures and echo and observation.           * Fever Suspected community acquired pneumonia of left lower lobe.  Patient admitted and started on antibitoics.  Cultures negative at 48 hours.     Echo performed, no vegetation visible.  Overall, clinical suspicion for bacteremia is low.  Will allow cultures to mature for 5 days, but safe for discharge home, has good follow up.   Patient now mentating at baseline, taking orals.  Temp < 100 F, heart rate < 100bpm, RR < 24, SpO2 at baseline.   Stable for discharge.      =========================== He had fevers, chills.  Admitted with PNA.  Done with abx.     DM2.  Sugar higher with illness.  Not checked as much since coming home.

## 2023-05-09 NOTE — Patient Instructions (Signed)
Go to the lab on the way out.   If you have mychart we'll likely use that to update you.     Please get a follow up chest xray here in October.   Take care.  Glad to see you.

## 2023-05-11 DIAGNOSIS — J189 Pneumonia, unspecified organism: Secondary | ICD-10-CM | POA: Insufficient documentation

## 2023-05-11 NOTE — Assessment & Plan Note (Signed)
See notes on labs.  Continue insulin and metformin for now.

## 2023-05-11 NOTE — Assessment & Plan Note (Signed)
Clear to auscultation.  Clinically improved.  Can recheck chest x-ray later on.  See after visit summary.  Okay for outpatient follow-up.

## 2023-05-17 ENCOUNTER — Telehealth: Payer: Self-pay | Admitting: Family Medicine

## 2023-05-17 NOTE — Telephone Encounter (Signed)
Pt's wife called returning Jessica's call regarding results. Told pt's wife, Dr. Lianne Bushy response, pt's wife had no questions/concerns. Call back # [phone]

## 2023-06-01 ENCOUNTER — Ambulatory Visit (INDEPENDENT_AMBULATORY_CARE_PROVIDER_SITE_OTHER)
Admission: RE | Admit: 2023-06-01 | Discharge: 2023-06-01 | Disposition: A | Discharge status: Home / Self Care | Payer: Medicare Other | Source: Ambulatory Visit | Attending: Family Medicine | Admitting: Family Medicine

## 2023-06-01 DIAGNOSIS — J189 Pneumonia, unspecified organism: Secondary | ICD-10-CM | POA: Diagnosis not present

## 2023-06-06 ENCOUNTER — Telehealth: Payer: Self-pay | Admitting: Family Medicine

## 2023-06-06 MED ORDER — ACCU-CHEK AVIVA PLUS VI STRP: ORAL_STRIP | 12 refills | Status: DC

## 2023-06-06 NOTE — Telephone Encounter (Signed)
Please update patient.  CXR looks improved- I am awaiting radiology overread.  Update [place] if pulmonary symptoms in the meantime.  Thanks.

## 2023-06-06 NOTE — Telephone Encounter (Signed)
Patient and spouse has been notified. Closing message

## 2023-06-06 NOTE — Telephone Encounter (Signed)
Spoke with patient to advise that per Dr. Para March his A1C is looking better and that he wanted to see him in January. Patient has been scheduled to be seen on 08/29/2023. Patient also inquired about his chest xray that he had in office on 06/01/2023. Please advise

## 2023-06-06 NOTE — Telephone Encounter (Signed)
Noted. Thanks.

## 2023-06-15 ENCOUNTER — Encounter: Payer: Medicare Other | Admitting: Cardiology

## 2023-06-21 NOTE — Progress Notes (Unsigned)
  Electrophysiology Office Follow up Visit Note:    Date:  06/22/2023   ID:  Carl, Barnes [birth year], MRN [id number]  PCP:  Joaquim Nam, MD  [facility] Cardiologist:  Yvonne Kendall, MD  [facility] Electrophysiologist:  Lanier Prude, MD    Interval History:     Carl Barnes is a 72 y.o. male who presents for a follow up visit.   Discussed the use of AI scribe software for clinical note transcription with the patient, who gave verbal consent to proceed.  History of Present Illness   [name], a 72 year old individual with a significant medical history including diabetes, hyperlipidemia, coronary artery disease, tobacco abuse, and bradycardia, presents for a follow-up visit. He had a pacemaker implanted on July 15th, an uncomplicated procedure. Subsequently, he was admitted to the hospital from September 2nd to 4th due to left lower lobe pneumonia. Blood cultures taken during the hospitalization showed no growth. Since leaving the hospital, he has been doing well. He has a history of syncope and now has a pacemaker in situ. He also has a diagnosis of chronic diastolic heart failure and has been monitoring his volume status.     He continues to have intermittent mild lightheadedness but no syncope like he did in the past. He feels better after the PPM went in.       Past medical, surgical, social and family history were reviewed.  ROS:   Please see the history of present illness.    All other systems reviewed and are negative.  EKGs/Labs/Other Studies Reviewed:    The following studies were reviewed today:  06/22/2023 in clinic device interrogation personally reviewed Battery and lead parameters stable. Increased rate response today Turned on rate drop response       Physical Exam:    VS:  BP 134/83 (BP Location: Left Arm, Patient Position: Sitting, Cuff Size: Normal)   Pulse 71   Ht 5\' 11"  (1.803 m)   Wt 197 lb (89.4 kg)   SpO2 96%   BMI  27.48 kg/m     Wt Readings from Last 3 Encounters:  06/22/23 197 lb (89.4 kg)  05/09/23 193 lb (87.5 kg)  04/25/23 200 lb (90.7 kg)     GEN:  Well nourished, well developed in no acute distress CARDIAC: RRR, no murmurs, rubs, gallops. CIED pocket well healed RESPIRATORY:  Clear to auscultation without rales, wheezing or rhonchi       ASSESSMENT:    1. Cardiac pacemaker in situ   2. Syncope, unspecified syncope type   3. Chronic diastolic heart failure (HCC)    PLAN:    In order of problems listed above:  Assessment and Plan    Syncope and Bradycardia with Pacemaker Pacemaker implanted on July 15th, functioning appropriately with well-healed incision. No recent episodes of syncope. -Continue remote monitoring of pacemaker.  Chronic Diastolic Heart Failure Stable, no recent exacerbations. -Continue monitoring volume status and reducing salt intake.  Recent Pneumonia Admitted to the hospital from September 2nd to 4th for left lower lobe pneumonia. Blood cultures showed no growth. -No further action required at this time.   Follow up 1 year w APP               Signed, Steffanie Dunn, MD, [facility], [place], [facility] 06/22/2023 8:42 AM    Electrophysiology [facility]

## 2023-06-22 ENCOUNTER — Encounter: Payer: Self-pay | Admitting: Cardiology

## 2023-06-22 ENCOUNTER — Ambulatory Visit: Payer: Medicare Other | Attending: Cardiology | Admitting: Cardiology

## 2023-06-22 VITALS — BP 134/83 | HR 71 | Ht 71.0 in | Wt 197.0 lb

## 2023-06-22 DIAGNOSIS — Z950 Presence of cardiac pacemaker: Secondary | ICD-10-CM | POA: Diagnosis not present

## 2023-06-22 DIAGNOSIS — R55 Syncope and collapse: Secondary | ICD-10-CM | POA: Diagnosis not present

## 2023-06-22 DIAGNOSIS — I5032 Chronic diastolic (congestive) heart failure: Secondary | ICD-10-CM

## 2023-06-22 LAB — CUP PACEART INCLINIC DEVICE CHECK
Date Time Interrogation Session: 20241030093304
Implantable Lead Connection Status: 753985
Implantable Lead Connection Status: 753985
Implantable Lead Implant Date: 20240715
Implantable Lead Implant Date: 20240715
Implantable Lead Location: 753859
Implantable Lead Location: 753860
Implantable Lead Model: 3830
Implantable Lead Model: 5076
Implantable Pulse Generator Implant Date: 20240715

## 2023-06-22 LAB — PACEMAKER DEVICE OBSERVATION

## 2023-06-22 NOTE — Patient Instructions (Signed)
 Medication Instructions:  Your physician recommends that you continue on your current medications as directed. Please refer to the Current Medication list given to you today.  *If you need a refill on your cardiac medications before your next appointment, please call your pharmacy*  Follow-Up: At [facility], you and your health needs are our priority.  As part of our continuing mission to provide you with exceptional heart care, we have created designated Provider Care Teams.  These Care Teams include your primary Cardiologist (physician) and Advanced Practice Providers (APPs -  Physician Assistants and Nurse Practitioners) who all work together to provide you with the care you need, when you need it.  Your next appointment:   1 year  Provider:   You will see one of the following Advanced Practice Providers on your designated Care Team:   Francis Dowse, Charlott Holler [address]" [place], [place] Sherie Don, NP Canary Brim, NP

## 2023-07-04 NOTE — Progress Notes (Unsigned)
Cardiology Office Note:  .   Date:  07/06/2023  ID:  Carl Barnes, DOB [birth year], MRN [id number] PCP: Joaquim Nam, MD   HeartCare Providers Cardiologist:  Yvonne Kendall, MD Electrophysiologist:  Lanier Prude, MD     History of Present Illness: .   Carl Barnes is a 72 y.o. male with history of nonobstructive coronary artery disease, complete heart block status post pacemaker (02/2023, [employer]), hyperlipidemia, type 2 diabetes mellitus, and tobacco use, presenting for follow-up of coronary artery disease and symptomatic bradycardia due to complete heart block status post pacemaker.  He was last seen in our office in July by Terrilee Croak, [place], at which time he reported multiple episodes of near syncope and syncope over the preceding 3 weeks.  Subsequent event monitor showed multiple episodes of complete heart block with ventricular pauses lasting up to 15 seconds.  Subsequently underwent dual-chamber pacemaker placement without further syncope though he continued to note mild intermittent lightheadedness from time to time at his last visit with Dr. Lalla Brothers in October.  Since reprogramming of his his device at that time, he has not had any further dizziness.  Carl Barnes otherwise feels well today denying chest pain, shortness of breath, palpitations, and edema.  His only concern is of persistent diarrhea related to metformin.  His last A1c in September was 9.7 though he notes his blood sugars at home have been better since then and he is hopeful that his A1c will be better at his next visit with Carl Barnes in January.  Of note, Carl Barnes was hospitalized for pneumonia in September but has recovered well from this.  ROS: See HPI  Studies Reviewed: Marland Kitchen   EKG Interpretation Date/Time:  Wednesday July 06 2023 10:45:49 EST Ventricular Rate:  61 PR Interval:  244 QRS Duration:  166 QT Interval:  458 QTC Calculation: 461 R Axis:   -77  Text Interpretation: Atrial-paced  rhythm with prolonged AV conduction Right bundle branch block Left anterior fascicular block Bifascicular block Minimal voltage criteria for LVH, may be normal variant ( R in aVL ) Possible Lateral infarct , age undetermined When compared with ECG of 25-Apr-2023 16:43, Electronic atrial pacemaker has replaced Electronic ventricular pacemaker Confirmed by Nasario Czerniak [phone]) on 07/06/2023 1:35:01 PM    TTE (04/27/2023): Normal LV size and wall thickness.  LVEF 60-65% with normal wall motion and grade 1 diastolic dysfunction.  Normal RV size and function.  Normal PA pressure.  Normal biatrial size.  No pericardial effusion.  Trivial mitral regurgitation.  Trivial tricuspid regurgitation.  Aortic sclerosis without stenosis.  Normal CVP.  Event monitor (02/25/2023): Predominantly sinus rhythm with multiple episodes of high-grade AV block and ventricular pauses of up to 14.6 seconds.  Risk Assessment/Calculations:             Physical Exam:   VS:  BP 130/74 (BP Location: Left Arm, Patient Position: Sitting, Cuff Size: Normal)   Pulse 61   Ht 5\' 11"  (1.803 m)   Wt 203 lb 9.6 oz (92.4 kg)   SpO2 97%   BMI 28.40 kg/m    Wt Readings from Last 3 Encounters:  07/06/23 203 lb 9.6 oz (92.4 kg)  06/22/23 197 lb (89.4 kg)  05/09/23 193 lb (87.5 kg)    General:  NAD. Neck: No JVD or HJR. Lungs: Clear to auscultation bilaterally without wheezes or crackles. Heart: Regular rate and rhythm without murmurs, rubs, or gallops. Abdomen: Soft, nontender, nondistended. Extremities: No lower extremity edema.  ASSESSMENT AND PLAN: .    Coronary artery disease: Carl Barnes does not have any angina or exertional dyspnea.  Continue medical therapy to prevent progression of nonobstructive disease.  Complete heart block status post pacemaker: No further syncope or dizziness following PPM and subsequent reprogramming at most recent visit with Dr. Lalla Brothers last month.  Continue ongoing follow-up through the device  clinic.  Elevated blood pressure reading: Blood pressure borderline elevated today at 130/74 (goal less than 130/80).  I encouraged Carl Barnes to monitor his blood pressure at home and to minimize his sodium intake.  If blood pressure remains elevated at his follow-up with Carl Barnes in January, pharmacotherapy will need to be considered.  Hyperlipidemia associated with type 2 diabetes mellitus: Continue pravastatin for LDL less than 70.  Ongoing management of DM per Carl Barnes.    Dispo: Return to clinic in 6 months.  Signed, Yvonne Kendall, MD

## 2023-07-06 ENCOUNTER — Encounter: Payer: Self-pay | Admitting: Internal Medicine

## 2023-07-06 ENCOUNTER — Ambulatory Visit: Payer: Medicare Other | Attending: Medical | Admitting: Internal Medicine

## 2023-07-06 VITALS — BP 130/74 | HR 61 | Ht 71.0 in | Wt 203.6 lb

## 2023-07-06 DIAGNOSIS — I2510 Atherosclerotic heart disease of native coronary artery without angina pectoris: Secondary | ICD-10-CM | POA: Diagnosis not present

## 2023-07-06 DIAGNOSIS — Z950 Presence of cardiac pacemaker: Secondary | ICD-10-CM | POA: Insufficient documentation

## 2023-07-06 DIAGNOSIS — I442 Atrioventricular block, complete: Secondary | ICD-10-CM | POA: Insufficient documentation

## 2023-07-06 DIAGNOSIS — R030 Elevated blood-pressure reading, without diagnosis of hypertension: Secondary | ICD-10-CM | POA: Diagnosis not present

## 2023-07-06 DIAGNOSIS — E1169 Type 2 diabetes mellitus with other specified complication: Secondary | ICD-10-CM

## 2023-07-06 DIAGNOSIS — E785 Hyperlipidemia, unspecified: Secondary | ICD-10-CM

## 2023-07-06 NOTE — Patient Instructions (Signed)
Medication Instructions:  Your physician recommends that you continue on your current medications as directed. Please refer to the Current Medication list given to you today.   *If you need a refill on your cardiac medications before your next appointment, please call your pharmacy*   Lab Work: No labs ordered today    Testing/Procedures: No test ordered today    Follow-Up: At [facility], you and your health needs are our priority.  As part of our continuing mission to provide you with exceptional heart care, we have created designated Provider Care Teams.  These Care Teams include your primary Cardiologist (physician) and Advanced Practice Providers (APPs -  Physician Assistants and Nurse Practitioners) who all work together to provide you with the care you need, when you need it.  We recommend signing up for the patient portal called "MyChart".  Sign up information is provided on this After Visit Summary.  MyChart is used to connect with patients for Virtual Visits (Telemedicine).  Patients are able to view lab/test results, encounter notes, upcoming appointments, etc.  Non-urgent messages can be sent to your provider as well.   To learn more about what you can do with MyChart, go to [email or web address].    Your next appointment:   6 month(s)  Provider:   You may see Yvonne Kendall, MD or one of the following Advanced Practice Providers on your designated Care Team:   Nicolasa Ducking, NP Eula Listen, PA-C Cadence Fransico Michael, PA-C Charlsie Quest, NP Carlos Levering, NP    Dr. Laurelyn Sickle recommends checking and keeping a log of your blood pressure twice weekly.   It is best to check your blood pressure 1-2 hours after taking your medications.  Below are some tips that our clinical pharmacists share for home BP monitoring:          Rest 10 minutes before taking your blood pressure.          Don't smoke or drink caffeinated beverages for at least 30 minutes before.           Take your blood pressure before (not after) you eat.          Sit comfortably with your back supported and both feet on the floor (don't cross your legs).          Elevate your arm to heart level on a table or a desk.          Use the proper sized cuff. It should fit smoothly and snugly around your bare upper arm. There should be enough room to slip a fingertip under the cuff. The bottom edge of the cuff should be 1 inch above the crease of the elbow.

## 2023-07-12 ENCOUNTER — Telehealth: Payer: Self-pay

## 2023-07-12 NOTE — Patient Outreach (Signed)
Attempted to contact patient regarding Colorectal exam. Left voicemail for patient to return my call at [phone].  Nicholes Rough, CMA Care Guide [employer]

## 2023-07-13 LAB — FECAL OCCULT BLOOD, IMMUNOCHEMICAL: IFOBT: NEGATIVE

## 2023-07-25 ENCOUNTER — Telehealth: Payer: Self-pay

## 2023-07-25 ENCOUNTER — Encounter: Payer: Self-pay | Admitting: Family Medicine

## 2023-07-25 NOTE — Telephone Encounter (Signed)
Left message per DPR to advise patient that the FIT was negative.

## 2023-08-29 ENCOUNTER — Ambulatory Visit: Payer: Medicare Other | Admitting: Family Medicine

## 2023-09-01 ENCOUNTER — Encounter: Payer: Self-pay | Admitting: Family Medicine

## 2023-09-01 ENCOUNTER — Ambulatory Visit: Payer: Medicare Other | Admitting: Family Medicine

## 2023-09-01 VITALS — BP 136/78 | HR 64 | Temp 98.0°F | Ht 71.0 in | Wt 199.6 lb

## 2023-09-01 DIAGNOSIS — E1169 Type 2 diabetes mellitus with other specified complication: Secondary | ICD-10-CM | POA: Diagnosis not present

## 2023-09-01 DIAGNOSIS — Z794 Long term (current) use of insulin: Secondary | ICD-10-CM

## 2023-09-01 DIAGNOSIS — M5412 Radiculopathy, cervical region: Secondary | ICD-10-CM | POA: Diagnosis not present

## 2023-09-01 LAB — POCT GLYCOSYLATED HEMOGLOBIN (HGB A1C): Hemoglobin A1C: 10 % — AB (ref 4.0–5.6)

## 2023-09-01 NOTE — Patient Instructions (Addendum)
 Go to the lab on the way out.   If you have mychart we'll likely use that to update you.    Take care.  Glad to see you. Use the eat right diet in the meantime.    We'll make plans when I see your labs.  Plan on a yearly visit in about 4 months.  Labs ahead of time.

## 2023-09-01 NOTE — Progress Notes (Signed)
 Diabetes:  Using medications without difficulties: yes, 38 units and 1 metformin  per day.   Hypoglycemic episodes: very rare, cautions d/w pt.  Hyperglycemic episodes: no Feet problems: no Blood Sugars averaging: 150-225 eye exam within last year: yes A1c 10.   Dw pt about diet.  He had been off DM2 diet.    I can fill his gabapentin  in the future, d/w pt.  It helps.  No ADE on med.  Not sedated.  Neck pain is better with use.    He feels better after pacer adjustment and isn't lightheaded.    Meds, vitals, and allergies reviewed.   ROS: Per HPI unless specifically indicated in ROS section   GEN: nad, alert and oriented HEENT: ncat NECK: supple w/o LA CV: rrr. PULM: ctab, no inc wob ABD: soft, +bs EXT: no edema SKIN: no acute rash  Diabetic foot exam: Normal inspection No skin breakdown No calluses  Normal DP pulses Normal sensation to light touch and monofilament Nails normal

## 2023-09-04 DIAGNOSIS — M5412 Radiculopathy, cervical region: Secondary | ICD-10-CM | POA: Insufficient documentation

## 2023-09-04 NOTE — Assessment & Plan Note (Signed)
 I can fill his gabapentin in the future, d/w pt. It helps. No ADE on med. Not sedated. Neck pain is better with use.

## 2023-09-04 NOTE — Assessment & Plan Note (Signed)
 A1c 10.   Dw pt about diet.  Handout given to patient. He had been off DM2 diet.   Plan on a yearly visit in about 4 months.  Labs ahead of time.  Check fructosamine in the meantime and can make med changes if that corresponds to A1c.  D/w pt.

## 2023-09-05 ENCOUNTER — Telehealth: Payer: Self-pay | Admitting: Family Medicine

## 2023-09-05 ENCOUNTER — Ambulatory Visit (INDEPENDENT_AMBULATORY_CARE_PROVIDER_SITE_OTHER): Payer: Medicare Other

## 2023-09-05 DIAGNOSIS — I442 Atrioventricular block, complete: Secondary | ICD-10-CM | POA: Diagnosis not present

## 2023-09-05 LAB — C-PEPTIDE: C-Peptide: 2.99 ng/mL (ref 0.80–3.85)

## 2023-09-05 LAB — FRUCTOSAMINE: Fructosamine: 381 umol/L — ABNORMAL HIGH (ref 205–285)

## 2023-09-05 NOTE — Telephone Encounter (Signed)
 Opened in error

## 2023-09-06 LAB — CUP PACEART REMOTE DEVICE CHECK
Battery Remaining Longevity: 164 mo
Battery Voltage: 3.18 V
Brady Statistic AP VP Percent: 15.91 %
Brady Statistic AP VS Percent: 36.69 %
Brady Statistic AS VP Percent: 10.33 %
Brady Statistic AS VS Percent: 37.07 %
Brady Statistic RA Percent Paced: 52.64 %
Brady Statistic RV Percent Paced: 26.24 %
Date Time Interrogation Session: 20250113010809
Implantable Lead Connection Status: 753985
Implantable Lead Connection Status: 753985
Implantable Lead Implant Date: 20240715
Implantable Lead Implant Date: 20240715
Implantable Lead Location: 753859
Implantable Lead Location: 753860
Implantable Lead Model: 3830
Implantable Lead Model: 5076
Implantable Pulse Generator Implant Date: 20240715
Lead Channel Impedance Value: 361 Ohm
Lead Channel Impedance Value: 418 Ohm
Lead Channel Impedance Value: 475 Ohm
Lead Channel Impedance Value: 589 Ohm
Lead Channel Pacing Threshold Amplitude: 0.5 V
Lead Channel Pacing Threshold Amplitude: 1.125 V
Lead Channel Pacing Threshold Pulse Width: 0.4 ms
Lead Channel Pacing Threshold Pulse Width: 0.4 ms
Lead Channel Sensing Intrinsic Amplitude: 1.875 mV
Lead Channel Sensing Intrinsic Amplitude: 1.875 mV
Lead Channel Sensing Intrinsic Amplitude: 22.5 mV
Lead Channel Sensing Intrinsic Amplitude: 22.5 mV
Lead Channel Setting Pacing Amplitude: 1.5 V
Lead Channel Setting Pacing Amplitude: 2 V
Lead Channel Setting Pacing Pulse Width: 0.4 ms
Lead Channel Setting Sensing Sensitivity: 1.2 mV
Zone Setting Status: 755011

## 2023-09-07 ENCOUNTER — Other Ambulatory Visit: Payer: Self-pay | Admitting: Family Medicine

## 2023-09-07 DIAGNOSIS — E1169 Type 2 diabetes mellitus with other specified complication: Secondary | ICD-10-CM

## 2023-09-07 DIAGNOSIS — E538 Deficiency of other specified B group vitamins: Secondary | ICD-10-CM

## 2023-09-07 DIAGNOSIS — Z125 Encounter for screening for malignant neoplasm of prostate: Secondary | ICD-10-CM

## 2023-09-08 ENCOUNTER — Encounter: Payer: Self-pay | Admitting: Cardiology

## 2023-09-09 ENCOUNTER — Other Ambulatory Visit: Payer: Self-pay | Admitting: Family Medicine

## 2023-09-11 ENCOUNTER — Encounter: Payer: Self-pay | Admitting: Cardiology

## 2023-10-18 NOTE — Addendum Note (Signed)
 Addended by: Geralyn Flash D on: 10/18/2023 09:39 AM   Modules accepted: Orders

## 2023-10-18 NOTE — Progress Notes (Signed)
 Remote pacemaker transmission.

## 2023-10-26 LAB — HM DIABETES EYE EXAM

## 2023-12-05 ENCOUNTER — Ambulatory Visit (INDEPENDENT_AMBULATORY_CARE_PROVIDER_SITE_OTHER): Payer: Medicare Other

## 2023-12-05 DIAGNOSIS — I442 Atrioventricular block, complete: Secondary | ICD-10-CM

## 2023-12-06 ENCOUNTER — Encounter: Payer: Self-pay | Admitting: Cardiology

## 2023-12-06 LAB — CUP PACEART REMOTE DEVICE CHECK
Battery Remaining Longevity: 154 mo
Battery Voltage: 3.14 V
Brady Statistic AP VP Percent: 40.12 %
Brady Statistic AP VS Percent: 3.41 %
Brady Statistic AS VP Percent: 53.05 %
Brady Statistic AS VS Percent: 3.42 %
Brady Statistic RA Percent Paced: 43.53 %
Brady Statistic RV Percent Paced: 93.17 %
Date Time Interrogation Session: 20250414001527
Implantable Lead Connection Status: 753985
Implantable Lead Connection Status: 753985
Implantable Lead Implant Date: 20240715
Implantable Lead Implant Date: 20240715
Implantable Lead Location: 753859
Implantable Lead Location: 753860
Implantable Lead Model: 3830
Implantable Lead Model: 5076
Implantable Pulse Generator Implant Date: 20240715
Lead Channel Impedance Value: 342 Ohm
Lead Channel Impedance Value: 399 Ohm
Lead Channel Impedance Value: 437 Ohm
Lead Channel Impedance Value: 589 Ohm
Lead Channel Pacing Threshold Amplitude: 0.625 V
Lead Channel Pacing Threshold Amplitude: 1.25 V
Lead Channel Pacing Threshold Pulse Width: 0.4 ms
Lead Channel Pacing Threshold Pulse Width: 0.4 ms
Lead Channel Sensing Intrinsic Amplitude: 1.625 mV
Lead Channel Sensing Intrinsic Amplitude: 1.625 mV
Lead Channel Sensing Intrinsic Amplitude: 20.75 mV
Lead Channel Sensing Intrinsic Amplitude: 20.75 mV
Lead Channel Setting Pacing Amplitude: 1.5 V
Lead Channel Setting Pacing Amplitude: 2 V
Lead Channel Setting Pacing Pulse Width: 0.4 ms
Lead Channel Setting Sensing Sensitivity: 1.2 mV
Zone Setting Status: 755011

## 2023-12-09 ENCOUNTER — Other Ambulatory Visit: Payer: Self-pay | Admitting: Family Medicine

## 2023-12-19 ENCOUNTER — Ambulatory Visit (INDEPENDENT_AMBULATORY_CARE_PROVIDER_SITE_OTHER): Payer: Medicare Other

## 2023-12-19 VITALS — BP 136/78 | Ht 71.0 in | Wt 197.0 lb

## 2023-12-19 DIAGNOSIS — Z0000 Encounter for general adult medical examination without abnormal findings: Secondary | ICD-10-CM | POA: Diagnosis not present

## 2023-12-19 DIAGNOSIS — Z5320 Procedure and treatment not carried out because of patient's decision for unspecified reasons: Secondary | ICD-10-CM

## 2023-12-19 DIAGNOSIS — Z2821 Immunization not carried out because of patient refusal: Secondary | ICD-10-CM

## 2023-12-19 NOTE — Progress Notes (Signed)
 Because this visit was a virtual/telehealth visit,  certain criteria was not obtained, such a blood pressure, CBG if applicable, and timed get up and go. Any medications not marked as "taking" were not mentioned during the medication reconciliation part of the visit. Any vitals not documented were not able to be obtained due to this being a telehealth visit or patient was unable to self-report a recent blood pressure reading due to a lack of equipment at home via telehealth. Vitals that have been documented are verbally provided by the patient.  Subjective:   Carl Barnes is a 73 y.o. who presents for a Medicare Wellness preventive visit.  Visit Complete: Virtual I connected with  [name] on 12/19/23 by a audio enabled telemedicine application and verified that I am speaking with the correct person using two identifiers.  Patient Location: Home  Provider Location: Home Office  I discussed the limitations of evaluation and management by telemedicine. The patient expressed understanding and agreed to proceed.  Vital Signs: Because this visit was a virtual/telehealth visit, some criteria may be missing or patient reported. Any vitals not documented were not able to be obtained and vitals that have been documented are patient reported.  VideoDeclined- This patient declined Librarian, academic. Therefore the visit was completed with audio only.  Persons Participating in Visit: Patient.  AWV Questionnaire: No: Patient Medicare AWV questionnaire was not completed prior to this visit.  Cardiac Risk Factors include: advanced age (>21men, >15 women);diabetes mellitus;male gender;dyslipidemia     Objective:    Today's Vitals   12/19/23 0944  BP: 136/78  Weight: 197 lb (89.4 kg)  Height: 5\' 11"  (1.803 m)   Body mass index is 27.48 kg/m.     12/19/2023    9:51 AM 04/26/2023   10:38 AM 03/06/2023   12:14 AM 03/05/2023    4:01 PM 12/15/2022    8:43 AM  10/30/2021    7:59 PM 10/30/2021   12:27 PM  Advanced Directives  Does Patient Have a Medical Advance Directive? No No  No No Yes No  Type of Careers adviser;Living will   Does patient want to make changes to medical advance directive?      No - Patient declined   Copy of Healthcare Power of Attorney in Chart?      No - copy requested   Would patient like information on creating a medical advance directive? No - Patient declined No - Patient declined No - Patient declined  No - Patient declined No - Patient declined No - Patient declined    Current Medications (verified) Outpatient Encounter Medications as of 12/19/2023  Medication Sig   acetaminophen  (TYLENOL ) 325 MG tablet    acetaminophen  (TYLENOL ) 650 MG CR tablet Take 1,300 mg by mouth daily as needed for pain or fever.   aspirin  EC 81 MG tablet Take 1 tablet (81 mg total) by mouth daily.   cetirizine (ZYRTEC) 10 MG tablet Take 10 mg by mouth daily as needed for allergies.   cyanocobalamin  (VITAMIN B12) 1000 MCG/ML injection 1000 mcg injected monthly   gabapentin  (NEURONTIN ) 600 MG tablet Take 600 mg by mouth in the morning and at bedtime.   glucose blood (ACCU-CHEK AVIVA PLUS) test strip Use as instructed   GNP ULTICARE PEN NEEDLES 31G X 5 MM MISC USE DAILY WITH INSULIN  PEN   insulin  glargine (LANTUS  SOLOSTAR) 100 UNIT/ML Solostar Pen INJECT 32 UNITS UNDER THE SKIN  ONCE DAILY   metFORMIN  (GLUCOPHAGE ) 500 MG tablet Take 1 tablet (500 mg total) by mouth at bedtime.   pravastatin  (PRAVACHOL ) 40 MG tablet TAKE ONE TABLET BY MOUTH EVERY NIGHT AT BEDTIME   SYRINGE-NEEDLE, DISP, 3 ML 25G X 1" 3 ML MISC Use with B12 injection   No facility-administered encounter medications on file as of 12/19/2023.    Allergies (verified) Chantix [varenicline], Codeine, and Metformin  and related   History: Past Medical History:  Diagnosis Date   Arthritis    Cholangitis 07/2010   [facility] admission - ERCP for stone  removal per GI   Diabetes mellitus without complication (HCC)    Elevated glucose 10/16 to 06/09/2004   [facility] admission, mild elevated cholesterol   GERD (gastroesophageal reflux disease)    History of echocardiogram    a. 05/2017 Echo: EF 55-60%, mild LVH, no rwma, mild MR.   Hyperlipidemia 12/21/1997   Non-obstructive CAD (coronary artery disease)    a. 05/2016 Abnl St Test: intermediate risk, large, partially reversible inf/infsept defect consistent w/ ischemia/? scar;  b. 06/2016 Cath: LM nl, LAD 94m, RI nl, LCX nl, OM1/2/3 nl, RCA 40p/m, 30d, RPDA/RPAV nl.   Poisoning by black widow spider bite 1979   RBBB    Renal stones    Syncope    Tobacco abuse    Past Surgical History:  Procedure Laterality Date   CARDIAC CATHETERIZATION Left 07/06/2016   Procedure: Left Heart Cath and Coronary Angiography;  Surgeon: Sammy Crisp, MD;  Location: [facility] INVASIVE CV LAB;  Service: Cardiovascular;  Laterality: Left;   Cardiolite  06/08/2004   WNL   CATARACT EXTRACTION W/ INTRAOCULAR LENS IMPLANT Right 10/13/2017   Dr. Roslynn Coombes   CATARACT EXTRACTION W/ INTRAOCULAR LENS IMPLANT Left 09/08/2017   Dr.Tanner   Chest CT w/contrast  06/08/2004   Negative PE   CHOLECYSTECTOMY  01/2011   Diseased GB.  [facility]   EYE SURGERY     Head MRI  07/1998   Normal (R/O acoustic neuroma)   PACEMAKER IMPLANT N/A 03/07/2023   Procedure: PACEMAKER IMPLANT;  Surgeon: Boyce Byes, MD;  Location: [facility] IN[facility] CV LAB;  Service: Cardiovascular;  Laterality: N/A;   Family History  Problem Relation Age of Onset   Cancer Mother        Pancreatic   Hyperlipidemia Mother    CVA Father    COPD Father    Hearing loss Father    Stroke Father    Stroke Brother    Heart disease Brother    Heart disease Paternal Grandfather        MI   Diabetes Neg Hx    Alcohol abuse Neg Hx    Drug abuse Neg Hx    Depression Neg Hx    Colon cancer Neg Hx    Prostate cancer Neg Hx    Social History   Socioeconomic History    Marital status: Married    Spouse name: Not on file   Number of children: 1   Years of education: Not on file   Highest education level: Not on file  Occupational History   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  Tobacco Use   Smoking status: Every Day    Current packs/day: 0.50    Average packs/day: 0.5 packs/day for 35.0 years (17.5 ttl pk-yrs)    Types: Cigarettes   Smokeless tobacco: Never   Tobacco comments:    Smokes 5 cigarettes daily; 02/25/2023.  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     [employer] fan   Enjoys hunting (black powder, bow, rifle) and fishing.     Social Drivers of Corporate investment banker Strain: Low Risk  (12/19/2023)   Overall Financial Resource Strain (CARDIA)    Difficulty of Paying Living Expenses: Not hard at all  Food Insecurity: No Food Insecurity (12/19/2023)   Hunger Vital Sign    Worried About Running Out of Food in the Last Year: Never true    Ran Out of Food in the Last Year: Never true  Transportation Needs: No Transportation Needs (12/19/2023)   PRAPARE - Administrator, Civil Service (Medical): No    Lack of Transportation (Non-Medical): No  Physical Activity: Sufficiently Active (12/19/2023)   Exercise Vital Sign    Days of Exercise per Week: 5 days    Minutes of Exercise per Session: 100 min  Stress: No Stress Concern Present (12/19/2023)   [employer] of Occupational Health - Occupational Stress Questionnaire    Feeling of Stress : Not at all  Social Connections: Moderately Integrated (12/19/2023)    Social Connection and Isolation Panel [NHANES]    Frequency of Communication with Friends and Family: More than three times a week    Frequency of Social Gatherings with Friends and Family: More than three times a week    Attends Religious Services: More than 4 times per year    Active Member of [employer] or Organizations: No    Attends Banker Meetings: Never    Marital Status: Married    Tobacco Counseling Ready to quit: Not Answered Counseling given: Not Answered Tobacco comments: Smokes 5 cigarettes daily; 02/25/2023.    Clinical Intake:  Pre-visit preparation completed: Yes  Pain : No/denies pain     BMI - recorded: 27.48 Nutritional Status: BMI 25 -29 Overweight Nutritional Risks: None Diabetes: Yes CBG done?: No Did pt. bring in CBG monitor from home?: No  Lab Results  Component Value Date   HGBA1C 10.0 (A) 09/01/2023   HGBA1C 9.7 (H) 05/09/2023   HGBA1C 9.1 (H) 12/27/2022     How often do you need to have someone help you when you read instructions, pamphlets, or other written materials from your doctor or pharmacy?: 1 - Never What is the last grade level you completed in school?: 1 year of college  Interpreter Needed?: No  Information entered by :: [employer]   Activities of Daily Living     12/19/2023    9:50 AM 04/26/2023   10:41 AM  In your present state of health, do you have any difficulty performing the following activities:  Hearing? 0 0  Vision? 0 0  Difficulty concentrating or making decisions? 0 0  Walking or climbing stairs? 0 0  Dressing or bathing? 0 0  Doing errands, shopping? 0 0  Preparing Food and eating ? N   Using the Toilet? N   In the past six months, have you accidently leaked urine? N   Do you have  problems with loss of bowel control? N   Managing your Medications? N   Managing your Finances? N   Housekeeping or managing your Housekeeping? N     Patient Care Team: Donnie Galea, MD as PCP - General  (Family Medicine) End, Veryl Gottron, MD as PCP - Cardiology (Cardiology) Boyce Byes, MD as PCP - Electrophysiology (Cardiology) Rudine Cos, MD as Consulting Physician (Ophthalmology)  Indicate any recent Medical Services you may have received from other than [facility] providers in the past year (date may be approximate).     Assessment:   This is a routine wellness examination for Dre.  Hearing/Vision screen Hearing Screening - Comments:: No hearing difficulties Vision Screening - Comments:: Wears glasses   Goals Addressed             This Visit's Progress    Patient Stated   On track    10/29/2019, I will maintain and continue medications as prescribed.        Depression Screen     12/19/2023    9:52 AM 09/01/2023   11:21 AM 02/23/2023   12:18 PM 01/03/2023    9:02 AM 12/15/2022    8:42 AM 10/05/2022   11:04 AM 08/06/2022    8:01 AM  PHQ 2/9 Scores  PHQ - 2 Score 0 0 0 0 0 0 0  PHQ- 9 Score 2 0 7 0  2     Fall Risk     12/19/2023    9:51 AM 09/01/2023   11:21 AM 02/23/2023   12:18 PM 01/03/2023    9:01 AM 12/15/2022    8:35 AM  Fall Risk   Falls in the past year? 0 1 1 0 0  Number falls in past yr: 0 1 1 0 0  Injury with Fall? 0 1 1 0 0  Risk for fall due to : No Fall Risks History of fall(s)  No Fall Risks No Fall Risks  Follow up Falls prevention discussed;Falls evaluation completed Falls evaluation completed  Falls evaluation completed Falls prevention discussed;Falls evaluation completed    MEDICARE RISK AT HOME:  Medicare Risk at Home Any stairs in or around the home?: Yes If so, are there any without handrails?: No Home free of loose throw rugs in walkways, pet beds, electrical cords, etc?: Yes Adequate lighting in your home to reduce risk of falls?: Yes Life alert?: No Use of a cane, walker or w/c?: No Grab bars in the bathroom?: Yes Shower chair or bench in shower?: Yes Elevated toilet seat or a handicapped toilet?: Yes  TIMED UP AND GO:  Was  the test performed?  No  Cognitive Function: 6CIT completed    10/29/2019   10:36 AM 10/18/2017    8:56 AM  MMSE - Mini Mental State Exam  Orientation to time 5 5  Orientation to Place 5 5  Registration 3 3  Attention/ Calculation 5 0  Recall 3 3  Language- name 2 objects  0  Language- repeat 1 1  Language- follow 3 step command  3  Language- read & follow direction  0  Write a sentence  0  Copy design  0  Total score  20        12/19/2023    9:47 AM 12/15/2022    8:45 AM  6CIT Screen  What Year? 0 points 0 points  What month? 0 points 0 points  What time? 0 points 0 points  Count back from 20 0 points 0 points  Months in reverse 0 points 0 points  Repeat phrase 0 points 0 points  Total Score 0 points 0 points    Immunizations Immunization History  Administered Date(s) Administered   Fluad Quad(high Dose 65+) 04/24/2019   Fluad Trivalent(High Dose 65+) 05/09/2023   Influenza, High Dose Seasonal PF 10/06/2021, 05/03/2022   Influenza,inj,Quad PF,6+ Mos 06/24/2017, 05/05/2018   Influenza-Unspecified 05/12/2016, 05/23/2020   PFIZER(Purple Top)SARS-COV-2 Vaccination 09/15/2019, 10/06/2019, 06/17/2020   Pneumococcal Conjugate-13 10/31/2017   Pneumococcal Polysaccharide-23 04/03/2019   Td 08/23/1996   Tdap 05/23/2018    Screening Tests Health Maintenance  Topic Date Due   Zoster Vaccines- Shingrix (1 of 2) Never done   Colonoscopy  04/11/2022   Diabetic kidney evaluation - Urine ACR  12/27/2023   HEMOGLOBIN A1C  02/29/2024   INFLUENZA VACCINE  03/23/2024   Diabetic kidney evaluation - eGFR measurement  05/08/2024   FOOT EXAM  08/31/2024   OPHTHALMOLOGY EXAM  10/25/2024   Medicare Annual Wellness (AWV)  12/18/2024   DTaP/Tdap/Td (3 - Td or Tdap) 05/23/2028   Pneumonia Vaccine 56+ Years old  Completed   Hepatitis C Screening  Completed   HPV VACCINES  Aged Out   Meningococcal B Vaccine  Aged Out   COVID-19 Vaccine  Discontinued    Health Maintenance  Health  Maintenance Due  Topic Date Due   Zoster Vaccines- Shingrix (1 of 2) Never done   Colonoscopy  04/11/2022   Diabetic kidney evaluation - Urine ACR  12/27/2023   Health Maintenance Items Addressed: Declined vaccinations  Additional Screening:  Vision Screening: Recommended annual ophthalmology exams for early detection of glaucoma and other disorders of the eye.  Dental Screening: Recommended annual dental exams for proper oral hygiene  Community Resource Referral / Chronic Care Management: CRR required this visit?  No   CCM required this visit?  No     Plan:     I have personally reviewed and noted the following in the patient's chart:   Medical and social history Use of alcohol, tobacco or illicit drugs  Current medications and supplements including opioid prescriptions. Patient is not currently taking opioid prescriptions. Functional ability and status Nutritional status Physical activity Advanced directives List of other physicians Hospitalizations, surgeries, and ER visits in previous 12 months Vitals Screenings to include cognitive, depression, and falls Referrals and appointments  In addition, I have reviewed and discussed with patient certain preventive protocols, quality metrics, and best practice recommendations. A written personalized care plan for preventive services as well as general preventive health recommendations were provided to patient.     Freeda Jerry, [place]   12/19/2023   After Visit Summary: (MyChart) Due to this being a telephonic visit, the after visit summary with patients personalized plan was offered to patient via MyChart   Notes: Nothing significant to report at this time.

## 2023-12-19 NOTE — Patient Instructions (Signed)
 Carl Barnes , Thank you for taking time to come for your Medicare Wellness Visit. I appreciate your ongoing commitment to your health goals. Please review the following plan we discussed and let me know if I can assist you in the future.   Referrals/Orders/Follow-Ups/Clinician Recommendations: follow up  This is a list of the screening recommended for you and due dates:  Health Maintenance  Topic Date Due   Zoster (Shingles) Vaccine (1 of 2) Never done   Colon Cancer Screening  04/11/2022   Yearly kidney health urinalysis for diabetes  12/27/2023   Hemoglobin A1C  02/29/2024   Flu Shot  03/23/2024   Yearly kidney function blood test for diabetes  05/08/2024   Complete foot exam   08/31/2024   Eye exam for diabetics  10/25/2024   Medicare Annual Wellness Visit  12/18/2024   DTaP/Tdap/Td vaccine (3 - Td or Tdap) 05/23/2028   Pneumonia Vaccine  Completed   Hepatitis C Screening  Completed   HPV Vaccine  Aged Out   Meningitis B Vaccine  Aged Out   COVID-19 Vaccine  Discontinued    Advanced directives: (Declined) Advance directive discussed with you today. Even though you declined this today, please call our office should you change your mind, and we can give you the proper paperwork for you to fill out.  Next Medicare Annual Wellness Visit scheduled for next year: Yes

## 2024-01-23 NOTE — Progress Notes (Signed)
 Remote pacemaker transmission.

## 2024-01-23 NOTE — Addendum Note (Signed)
 Addended by: Edra Govern D on: 01/23/2024 05:50 PM   Modules accepted: Orders

## 2024-02-23 ENCOUNTER — Ambulatory Visit: Attending: Internal Medicine | Admitting: Internal Medicine

## 2024-02-23 ENCOUNTER — Encounter: Payer: Self-pay | Admitting: Internal Medicine

## 2024-02-23 VITALS — BP 126/74 | HR 66 | Ht 71.0 in | Wt 196.6 lb

## 2024-02-23 DIAGNOSIS — I2510 Atherosclerotic heart disease of native coronary artery without angina pectoris: Secondary | ICD-10-CM

## 2024-02-23 DIAGNOSIS — E1169 Type 2 diabetes mellitus with other specified complication: Secondary | ICD-10-CM | POA: Diagnosis not present

## 2024-02-23 DIAGNOSIS — I442 Atrioventricular block, complete: Secondary | ICD-10-CM | POA: Diagnosis not present

## 2024-02-23 DIAGNOSIS — E785 Hyperlipidemia, unspecified: Secondary | ICD-10-CM

## 2024-02-23 DIAGNOSIS — Z950 Presence of cardiac pacemaker: Secondary | ICD-10-CM | POA: Diagnosis not present

## 2024-02-23 DIAGNOSIS — Z79899 Other long term (current) drug therapy: Secondary | ICD-10-CM

## 2024-02-23 MED ORDER — ASPIRIN 81 MG PO TBEC: 81.0000 mg | DELAYED_RELEASE_TABLET | Freq: Every day | ORAL | Status: DC

## 2024-02-23 NOTE — Progress Notes (Signed)
 Cardiology Office Note:  .   Date:  02/23/2024  ID:  [name], DOB [birth year], MRN [id number] PCP: Cleatus Arlyss RAMAN, MD  [facility] Providers Cardiologist:  Lonni Hanson, MD Electrophysiologist:  OLE ONEIDA HOLTS, MD     History of Present Illness: .   Carl Barnes is a 73 y.o. 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  I last saw him in 06/2019 for, which time he was feeling well.  His only concern was of diarrhea related to metformin .  We did not make any medication changes or pursue additional testing.  Today, Carl Barnes reports that he is feeling fairly well.  He notes occasional dizziness that only lasts a few seconds at a time.  It seems to be worse in the summer heat.  He has not passed out or fallen.  He notes that the dizziness improved after pacemaker reprogramming in October.  He denies chest pain, shortness of breath, and edema.  He notes sporadic palpitations, almost as if he can feel his pacemaker kick in.  He is not exercising regularly but remains very active at home.  He continues to have sporadic diarrhea with metformin .  He is not taking aspirin  at this time because of things that he heard in the news.  ROS: See HPI  Studies Reviewed: SABRA   EKG Interpretation Date/Time:  Thursday February 23 2024 08:06:33 EDT Ventricular Rate:  66 PR Interval:  154 QRS Duration:  128 QT Interval:  428 QTC Calculation: 448 R Axis:   -65  Text Interpretation: AV dual-paced rhythm When compared with ECG of 06-Jul-2023 10:45, VENTRICULAR PACING is now Present Confirmed by Lasasha Brophy, Lonni [phone]) on 02/23/2024 8:09:25 AM    Risk Assessment/Calculations:             Physical Exam:   VS:  BP 126/74 (BP Location: Left Arm, Patient Position: Sitting, Cuff Size:  Normal)   Pulse 66   Ht 5' 11 (1.803 m)   Wt 196 lb 9.6 oz (89.2 kg)   SpO2 98%   BMI 27.42 kg/m    Wt Readings from Last 3 Encounters:  02/23/24 196 lb 9.6 oz (89.2 kg)  12/19/23 197 lb (89.4 kg)  09/01/23 199 lb 9.6 oz (90.5 kg)    General:  NAD. Neck: No JVD or HJR. Lungs: Clear to auscultation bilaterally without wheezes or crackles. Heart: Regular rate and rhythm without murmurs, rubs, or gallops. Abdomen: Soft, nontender, nondistended. Extremities: No lower extremity edema.  ASSESSMENT AND PLAN: .    Coronary artery disease: Carl Barnes does not have any symptoms to suggest worsening coronary insufficiency.  We reviewed his catheterization findings from 2017, which demonstrated mild-moderate, nonobstructive CAD.  We have discussed the risks and benefits of aspirin  therapy and have agreed to resume aspirin  81 mg daily.  Continue pravastatin  40 mg daily to prevent progression of CAD.  I will check a CBC, CMP, and lipid panel today.  Complete heart block status post pacemaker: No evidence of pacemaker dysfunction.  EKG today shows AV paced rhythm.  Sporadic lightheadedness has been longstanding and unchanged since pacemaker reprogramming in October.  I encouraged Carl Barnes to stay well-hydrated.  Continue EP follow-up.  Check CBC, CMP, and TSH.  Hyperlipidemia associated with type 2 diabetes mellitus: LDL well-controlled on last check in 12/2022.  Repeat  labs were ordered by Dr. Cleatus in January but were never drawn.  Will check a CMP, lipid panel, and hemoglobin A1c today.  Continue metformin  and insulin  glargine for now, though alternative therapies/medication adjustments may need to be explored again with Dr. Cleatus if diarrhea persists and/or hemoglobin A1c remains above goal.     Dispo: Return to clinic in 1 year.  Signed, Lonni Hanson, MD

## 2024-02-23 NOTE — Patient Instructions (Signed)
 Medication Instructions:  Your physician recommends the following medication changes.  RESTART: Asprin 81 mg by mouth daily   *If you need a refill on your cardiac medications before your next appointment, please call your pharmacy*  Lab Work: Your provider would like for you to have following labs drawn today CBC, CMP, TSH, Lipid, A1C.     Testing/Procedures: No test ordered today   Follow-Up: At [facility], you and your health needs are our priority.  As part of our continuing mission to provide you with exceptional heart care, our providers are all part of one team.  This team includes your primary Cardiologist (physician) and Advanced Practice Providers or APPs (Physician Assistants and Nurse Practitioners) who all work together to provide you with the care you need, when you need it.  Your next appointment:   1 year(s)  Provider:   You may see Lonni Hanson, MD or one of the following Advanced Practice Providers on your designated Care Team:   Lonni Meager, NP Lesley Maffucci, PA-C Bernardino Bring, PA-C Cadence [place], PA-C Tylene Lunch, NP Barnie Hila, NP

## 2024-02-24 ENCOUNTER — Ambulatory Visit: Payer: Self-pay | Admitting: Internal Medicine

## 2024-02-24 LAB — CBC
Hematocrit: 49.9 % (ref 37.5–51.0)
Hemoglobin: 16.5 g/dL (ref 13.0–17.7)
MCH: 32.2 pg (ref 26.6–33.0)
MCHC: 33.1 g/dL (ref 31.5–35.7)
MCV: 97 fL (ref 79–97)
Platelets: 133 x10E3/uL — ABNORMAL LOW (ref 150–450)
RBC: 5.13 x10E6/uL (ref 4.14–5.80)
RDW: 12.5 % (ref 11.6–15.4)
WBC: 10.1 x10E3/uL (ref 3.4–10.8)

## 2024-02-24 LAB — COMPREHENSIVE METABOLIC PANEL WITH GFR
ALT: 13 IU/L (ref 0–44)
AST: 13 IU/L (ref 0–40)
Albumin: 4.1 g/dL (ref 3.8–4.8)
Alkaline Phosphatase: 80 IU/L (ref 44–121)
BUN/Creatinine Ratio: 13 (ref 10–24)
BUN: 11 mg/dL (ref 8–27)
Bilirubin Total: 0.7 mg/dL (ref 0.0–1.2)
CO2: 19 mmol/L — ABNORMAL LOW (ref 20–29)
Calcium: 9.3 mg/dL (ref 8.6–10.2)
Chloride: 102 mmol/L (ref 96–106)
Creatinine, Ser: 0.84 mg/dL (ref 0.76–1.27)
Globulin, Total: 2.2 g/dL (ref 1.5–4.5)
Glucose: 218 mg/dL — ABNORMAL HIGH (ref 70–99)
Potassium: 4.3 mmol/L (ref 3.5–5.2)
Sodium: 138 mmol/L (ref 134–144)
Total Protein: 6.3 g/dL (ref 6.0–8.5)
eGFR: 92 mL/min/1.73 (ref >59)

## 2024-02-24 LAB — HEMOGLOBIN A1C
Est. average glucose Bld gHb Est-mCnc: 249 mg/dL
Hgb A1c MFr Bld: 10.3 % — ABNORMAL HIGH (ref 4.8–5.6)

## 2024-02-24 LAB — LIPID PANEL
Chol/HDL Ratio: 3.7 ratio (ref 0.0–5.0)
Cholesterol, Total: 111 mg/dL (ref 100–199)
HDL: 30 mg/dL — ABNORMAL LOW (ref >39)
LDL Chol Calc (NIH): 52 mg/dL (ref 0–99)
Triglycerides: 171 mg/dL — ABNORMAL HIGH (ref 0–149)
VLDL Cholesterol Cal: 29 mg/dL (ref 5–40)

## 2024-02-24 LAB — TSH: TSH: 2.75 u[IU]/mL (ref 0.450–4.500)

## 2024-02-26 ENCOUNTER — Other Ambulatory Visit: Payer: Self-pay | Admitting: Family Medicine

## 2024-02-27 ENCOUNTER — Telehealth: Payer: Self-pay | Admitting: *Deleted

## 2024-02-27 NOTE — Telephone Encounter (Signed)
 Pt had recent labs but per Dr. Cleatus pt needs a f/u non fasting lab appt and then a CPE appt with him (last CPE was 12/2022), please schedule both thanks

## 2024-03-05 ENCOUNTER — Ambulatory Visit: Payer: Medicare Other

## 2024-03-05 ENCOUNTER — Ambulatory Visit: Payer: Self-pay | Admitting: Cardiology

## 2024-03-05 DIAGNOSIS — I442 Atrioventricular block, complete: Secondary | ICD-10-CM

## 2024-03-05 LAB — CUP PACEART REMOTE DEVICE CHECK
Battery Remaining Longevity: 147 mo
Battery Voltage: 3.09 V
Brady Statistic AP VP Percent: 33.17 %
Brady Statistic AP VS Percent: 6.49 %
Brady Statistic AS VP Percent: 53.09 %
Brady Statistic AS VS Percent: 7.26 %
Brady Statistic RA Percent Paced: 39.65 %
Brady Statistic RV Percent Paced: 86.25 %
Date Time Interrogation Session: 20250713191933
Implantable Lead Connection Status: 753985
Implantable Lead Connection Status: 753985
Implantable Lead Implant Date: 20240715
Implantable Lead Implant Date: 20240715
Implantable Lead Location: 753859
Implantable Lead Location: 753860
Implantable Lead Model: 3830
Implantable Lead Model: 5076
Implantable Pulse Generator Implant Date: 20240715
Lead Channel Impedance Value: 342 Ohm
Lead Channel Impedance Value: 380 Ohm
Lead Channel Impedance Value: 418 Ohm
Lead Channel Impedance Value: 551 Ohm
Lead Channel Pacing Threshold Amplitude: 0.5 V
Lead Channel Pacing Threshold Amplitude: 1.125 V
Lead Channel Pacing Threshold Pulse Width: 0.4 ms
Lead Channel Pacing Threshold Pulse Width: 0.4 ms
Lead Channel Sensing Intrinsic Amplitude: 1.75 mV
Lead Channel Sensing Intrinsic Amplitude: 1.75 mV
Lead Channel Sensing Intrinsic Amplitude: 25.5 mV
Lead Channel Sensing Intrinsic Amplitude: 25.5 mV
Lead Channel Setting Pacing Amplitude: 1.5 V
Lead Channel Setting Pacing Amplitude: 2 V
Lead Channel Setting Pacing Pulse Width: 0.4 ms
Lead Channel Setting Sensing Sensitivity: 1.2 mV
Zone Setting Status: 755011

## 2024-03-08 ENCOUNTER — Other Ambulatory Visit: Payer: Self-pay | Admitting: Family Medicine

## 2024-03-19 ENCOUNTER — Other Ambulatory Visit (INDEPENDENT_AMBULATORY_CARE_PROVIDER_SITE_OTHER)

## 2024-03-19 DIAGNOSIS — E1169 Type 2 diabetes mellitus with other specified complication: Secondary | ICD-10-CM

## 2024-03-19 DIAGNOSIS — Z125 Encounter for screening for malignant neoplasm of prostate: Secondary | ICD-10-CM

## 2024-03-19 DIAGNOSIS — Z794 Long term (current) use of insulin: Secondary | ICD-10-CM

## 2024-03-19 DIAGNOSIS — E538 Deficiency of other specified B group vitamins: Secondary | ICD-10-CM | POA: Diagnosis not present

## 2024-03-19 LAB — MICROALBUMIN / CREATININE URINE RATIO
Creatinine,U: 249.8 mg/dL
Microalb Creat Ratio: 7.8 mg/g (ref 0.0–30.0)
Microalb, Ur: 1.9 mg/dL (ref 0.0–1.9)

## 2024-03-20 LAB — PSA, MEDICARE: PSA: 0.66 ng/mL (ref 0.10–4.00)

## 2024-03-20 LAB — VITAMIN B12: Vitamin B-12: 832 pg/mL (ref 211–911)

## 2024-03-21 ENCOUNTER — Ambulatory Visit: Payer: Self-pay | Admitting: Family Medicine

## 2024-03-22 LAB — FRUCTOSAMINE: Fructosamine: 343 umol/L — ABNORMAL HIGH (ref 205–285)

## 2024-03-26 ENCOUNTER — Encounter: Payer: Self-pay | Admitting: Family Medicine

## 2024-03-26 ENCOUNTER — Ambulatory Visit (INDEPENDENT_AMBULATORY_CARE_PROVIDER_SITE_OTHER): Admitting: Family Medicine

## 2024-03-26 VITALS — BP 128/60 | HR 69 | Temp 97.8°F | Ht 68.7 in | Wt 192.2 lb

## 2024-03-26 DIAGNOSIS — E1169 Type 2 diabetes mellitus with other specified complication: Secondary | ICD-10-CM | POA: Diagnosis not present

## 2024-03-26 DIAGNOSIS — F17200 Nicotine dependence, unspecified, uncomplicated: Secondary | ICD-10-CM

## 2024-03-26 DIAGNOSIS — Z794 Long term (current) use of insulin: Secondary | ICD-10-CM | POA: Diagnosis not present

## 2024-03-26 DIAGNOSIS — E785 Hyperlipidemia, unspecified: Secondary | ICD-10-CM

## 2024-03-26 DIAGNOSIS — Z7189 Other specified counseling: Secondary | ICD-10-CM

## 2024-03-26 DIAGNOSIS — Z1211 Encounter for screening for malignant neoplasm of colon: Secondary | ICD-10-CM

## 2024-03-26 DIAGNOSIS — E538 Deficiency of other specified B group vitamins: Secondary | ICD-10-CM

## 2024-03-26 DIAGNOSIS — Z0000 Encounter for general adult medical examination without abnormal findings: Secondary | ICD-10-CM

## 2024-03-26 MED ORDER — LANTUS SOLOSTAR 100 UNIT/ML ~~LOC~~ SOPN: 38.0000 [IU] | PEN_INJECTOR | Freq: Every day | SUBCUTANEOUS | Status: AC

## 2024-03-26 NOTE — Progress Notes (Unsigned)
 Diabetes:  Using medications without difficulties: some diarrhea Hypoglycemic episodes:no Hyperglycemic episodes:no Feet problems:no Blood Sugars averaging: 130-150s  eye exam within last year:yes He likely has artificially elevated A1c.  He has been working on diet with intentional weight loss.   B12 def of replacement.  Labs d/w pt.    Pacer placement, had cards f/u.  He feels better in the meantime.  Elevated Cholesterol: Using medications without problems: yes Muscle aches: some cramping when supine, not walking.  Diet compliance: yes Exercise:yes D/w pt about statin options.    He is cutting back on smoking.  D/w pt.    covid vaccine 2021.   Tetanus 2019 PNA up to date.  RSV d/w pt.   Flu pending 2025.   shingrix d/w pt.  Prev had zostavax.  Smoking cessation d/w pt. A few cigs a day.   PSA wnl.  Pros and cons of testing d/w pt.   D/w patient [id number] for colon cancer screening, including IFOB vs. colonoscopy.  Risks and benefits of both were discussed and patient voiced understanding.  Pt elects for: cologuard.  If incapacitated, would have his wife designated.   PMH and SH reviewed.   Vital signs, Meds and allergies reviewed.  ROS: Per HPI unless specifically indicated in ROS section   GEN: nad, alert and oriented HEENT: mucous membranes moist NECK: supple w/o LA CV: rrr.  no murmur PULM: ctab, no inc wob ABD: soft, +bs EXT: no edema SKIN: no acute rash  Diabetic foot exam: Normal inspection No skin breakdown No calluses  Normal DP pulses Normal sensation to light touch and monofilament Nails thickened.

## 2024-03-26 NOTE — Patient Instructions (Addendum)
 Stop metformin  and let me know if you have trouble with diarrhea.  Recheck in about 6 months.  Labs prior to the visit.   You may need to add 1 unit of insulin  every few days to keep your sugar controlled.    If you keep having cramping after stopping metformin  then hold pravastatin  and let me know.

## 2024-03-28 NOTE — Assessment & Plan Note (Signed)
 Continue B12 replacement as is.  Most recent lab result was a peak level.

## 2024-03-28 NOTE — Assessment & Plan Note (Signed)
 covid vaccine 2021.   Tetanus 2019 PNA up to date.  RSV d/w pt.   Flu pending 2025.   shingrix d/w pt.  Prev had zostavax.  Smoking cessation d/w pt. A few cigs a day.   PSA wnl.  Pros and cons of testing d/w pt.   D/w patient mz:[id number] for colon cancer screening, including IFOB vs. colonoscopy.  Risks and benefits of both were discussed and patient voiced understanding.  Pt elects for: cologuard.  If incapacitated, would have his wife designated.

## 2024-03-28 NOTE — Assessment & Plan Note (Signed)
 He likely has artificially elevated A1c.  He has been working on diet with intentional weight loss.  Fructosamine converts to A1c ~7.4.  Discussed with patient at office visit.  Stop metformin  and he can let me know if still having trouble with diarrhea.  Recheck in about 6 months.  Labs prior to the visit.   He may need to add 1 unit of insulin  every few days to keep his sugar controlled.    If still having cramping after stopping metformin  then hold pravastatin  and let me know.

## 2024-03-28 NOTE — Assessment & Plan Note (Signed)
 He is cutting back on smoking.  Cessation encouraged.

## 2024-03-28 NOTE — Assessment & Plan Note (Signed)
If incapacitated, would have his wife designated.  

## 2024-03-28 NOTE — Assessment & Plan Note (Signed)
 If still having cramping after stopping metformin  then hold pravastatin  and let me know.  Discussed.

## 2024-04-14 LAB — COLOGUARD

## 2024-04-15 ENCOUNTER — Ambulatory Visit: Payer: Self-pay | Admitting: Family Medicine

## 2024-04-25 LAB — COLOGUARD: COLOGUARD: NEGATIVE

## 2024-05-31 NOTE — Progress Notes (Signed)
 Remote PPM Transmission

## 2024-06-04 ENCOUNTER — Ambulatory Visit: Payer: Medicare Other

## 2024-06-04 DIAGNOSIS — I442 Atrioventricular block, complete: Secondary | ICD-10-CM | POA: Diagnosis not present

## 2024-06-04 LAB — CUP PACEART REMOTE DEVICE CHECK
Battery Remaining Longevity: 145 mo
Battery Voltage: 3.05 V
Brady Statistic AP VP Percent: 49.79 %
Brady Statistic AP VS Percent: 0 %
Brady Statistic AS VP Percent: 50.19 %
Brady Statistic AS VS Percent: 0.03 %
Brady Statistic RA Percent Paced: 49.78 %
Brady Statistic RV Percent Paced: 99.97 %
Date Time Interrogation Session: 20251013002136
Implantable Lead Connection Status: 753985
Implantable Lead Connection Status: 753985
Implantable Lead Implant Date: 20240715
Implantable Lead Implant Date: 20240715
Implantable Lead Location: 753859
Implantable Lead Location: 753860
Implantable Lead Model: 3830
Implantable Lead Model: 5076
Implantable Pulse Generator Implant Date: 20240715
Lead Channel Impedance Value: 342 Ohm
Lead Channel Impedance Value: 380 Ohm
Lead Channel Impedance Value: 532 Ohm
Lead Channel Impedance Value: 627 Ohm
Lead Channel Pacing Threshold Amplitude: 0.625 V
Lead Channel Pacing Threshold Amplitude: 1.25 V
Lead Channel Pacing Threshold Pulse Width: 0.4 ms
Lead Channel Pacing Threshold Pulse Width: 0.4 ms
Lead Channel Sensing Intrinsic Amplitude: 2.125 mV
Lead Channel Sensing Intrinsic Amplitude: 2.125 mV
Lead Channel Sensing Intrinsic Amplitude: 25.5 mV
Lead Channel Sensing Intrinsic Amplitude: 25.5 mV
Lead Channel Setting Pacing Amplitude: 1.5 V
Lead Channel Setting Pacing Amplitude: 2 V
Lead Channel Setting Pacing Pulse Width: 0.4 ms
Lead Channel Setting Sensing Sensitivity: 1.2 mV
Zone Setting Status: 755011

## 2024-06-05 NOTE — Progress Notes (Signed)
 Remote PPM Transmission

## 2024-06-06 ENCOUNTER — Ambulatory Visit: Payer: Self-pay | Admitting: Cardiology

## 2024-06-29 ENCOUNTER — Other Ambulatory Visit: Payer: Self-pay | Admitting: Family Medicine

## 2024-07-20 ENCOUNTER — Encounter: Payer: Self-pay | Admitting: Pharmacist

## 2024-07-20 NOTE — Progress Notes (Signed)
 Pharmacy Quality Measure Review  This patient is appearing on a report for being at risk of failing the adherence measure for cholesterol (statin) medications this calendar year.   Medication: pravastatin  40 mg Last fill date: 03/29/24 for 90 day supply  Insurance report was not up to date. No action needed at this time.  Medication has been refilled as of 06/23/24 x90ds. Next refill due 2026.    KED: closed UACR: 03/19/2024 GRF: 02/23/2024  GAD: closed A1c: 02/23/2024

## 2024-07-28 ENCOUNTER — Emergency Department (HOSPITAL_BASED_OUTPATIENT_CLINIC_OR_DEPARTMENT_OTHER)

## 2024-07-28 ENCOUNTER — Emergency Department (HOSPITAL_BASED_OUTPATIENT_CLINIC_OR_DEPARTMENT_OTHER)
Admission: EM | Admit: 2024-07-28 | Discharge: 2024-07-28 | Disposition: A | Discharge status: Home / Self Care | Attending: Emergency Medicine | Admitting: Emergency Medicine

## 2024-07-28 ENCOUNTER — Other Ambulatory Visit: Payer: Self-pay

## 2024-07-28 ENCOUNTER — Encounter (HOSPITAL_BASED_OUTPATIENT_CLINIC_OR_DEPARTMENT_OTHER): Payer: Self-pay | Admitting: *Deleted

## 2024-07-28 DIAGNOSIS — M25552 Pain in left hip: Secondary | ICD-10-CM | POA: Insufficient documentation

## 2024-07-28 DIAGNOSIS — N492 Inflammatory disorders of scrotum: Secondary | ICD-10-CM | POA: Insufficient documentation

## 2024-07-28 DIAGNOSIS — Z794 Long term (current) use of insulin: Secondary | ICD-10-CM | POA: Insufficient documentation

## 2024-07-28 DIAGNOSIS — E119 Type 2 diabetes mellitus without complications: Secondary | ICD-10-CM | POA: Insufficient documentation

## 2024-07-28 DIAGNOSIS — Z7982 Long term (current) use of aspirin: Secondary | ICD-10-CM | POA: Insufficient documentation

## 2024-07-28 DIAGNOSIS — W1830XA Fall on same level, unspecified, initial encounter: Secondary | ICD-10-CM | POA: Insufficient documentation

## 2024-07-28 MED ORDER — SULFAMETHOXAZOLE-TRIMETHOPRIM 800-160 MG PO TABS: 1.0000 | ORAL_TABLET | Freq: Two times a day (BID) | ORAL | 0 refills | Status: DC

## 2024-07-28 MED ORDER — SULFAMETHOXAZOLE-TRIMETHOPRIM 800-160 MG PO TABS
1.0000 | ORAL_TABLET | Freq: Once | ORAL | Status: AC
  Administered 2024-07-28: 1 via ORAL
  Filled 2024-07-28: qty 1

## 2024-07-28 MED ORDER — LIDOCAINE HCL (PF) 1 % IJ SOLN
10.0000 mL | Freq: Once | INTRAMUSCULAR | Status: AC
  Administered 2024-07-28: 10 mL
  Filled 2024-07-28: qty 10

## 2024-07-28 MED ORDER — LIDOCAINE-EPINEPHRINE-TETRACAINE (LET) TOPICAL GEL
3.0000 mL | Freq: Once | TOPICAL | Status: AC
  Administered 2024-07-28: 3 mL via TOPICAL
  Filled 2024-07-28: qty 3

## 2024-07-28 NOTE — ED Notes (Signed)
 Reviewed discharge instructions, medications, and home care with pt. Pt verbalized understanding and had no further questions. Pt exited ED without complications.

## 2024-07-28 NOTE — Discharge Instructions (Signed)
 Please continue to use warm compresses 3 times per day.  I have given you follow-up with urology.  You can call and schedule appointment.  Please return to the emergency room if any worsening symptoms.  I have also sent you a antibiotic called Bactrim  to your pharmacy.  Please take as prescribed.

## 2024-07-28 NOTE — ED Provider Notes (Signed)
 [facility] Barnes EMERGENCY DEPARTMENT AT [facility] Provider Note   CSN: [id number] Arrival date & time: 07/28/24  1647     Patient presents with: Abscess   Carl Barnes is a 73 y.o. male patient with history of type 2 diabetes who presents to the emergency department today for further evaluation of left groin swelling and pain and drainage.  Has been progressively worsening over the last couple of weeks.  He states he has had a similar episode in the past roughly 2 to 3 years ago on the right side.  It did require incision and drainage at that time in the emergency department.  Per chart review, patient was seen evaluated back in March 2023 for similar symptoms on the right side.  This is originally thought to be due secondary to trauma with an infection.  Had a CT scan at that time which showed questionable inflammatory versus infectious process.  He received IV vancomycin  at that time had an I&D at bedside was ultimately discharged.  Today, patient accompanied by his wife and wife states that it was actively draining prior to arrival.  When asked to quantify this patient said cups full of bloody foul-smelling purulent material.  He denies fever or chills.  Also of note, patient sustained a fall roughly 3 weeks ago and has been having left-sided hip pain and is wondering if this is related.  Requests hip x-ray.  Not anticoagulated.    Abscess      Prior to Admission medications   Medication Sig Start Date End Date Taking? Authorizing Provider  sulfamethoxazole -trimethoprim  (BACTRIM  DS) 800-160 MG tablet Take 1 tablet by mouth 2 (two) times daily for 7 days. 07/28/24 08/04/24 Yes Ellysia Char M, PA-C  acetaminophen  (TYLENOL ) 650 MG CR tablet Take 1,300 mg by mouth daily as needed for pain or fever.    [provider]  aspirin  EC 81 MG tablet Take 1 tablet (81 mg total) by mouth daily. Swallow whole. 02/23/24   End, Lonni, MD  cetirizine (ZYRTEC) 10 MG tablet Take  10 mg by mouth daily as needed for allergies.    [provider]  cyanocobalamin  (VITAMIN B12) 1000 MCG/ML injection 1000 mcg injected monthly 10/05/22   Cleatus Arlyss RAMAN, MD  gabapentin  (NEURONTIN ) 600 MG tablet Take 600 mg by mouth in the morning and at bedtime.    [provider]  glucose blood (ACCU-CHEK AVIVA PLUS) test strip USE AS INSTRUCTED TO TEST BLOOD SUGAR 06/29/24   Cleatus Arlyss RAMAN, MD  GNP ULTICARE PEN NEEDLES 31G X 5 MM MISC USE DAILY WITH INSULIN  PEN 09/09/23   Cleatus Arlyss RAMAN, MD  insulin  glargine (LANTUS  SOLOSTAR) 100 UNIT/ML Solostar Pen Inject 38 Units into the skin daily. 03/26/24   Cleatus Arlyss RAMAN, MD  pravastatin  (PRAVACHOL ) 40 MG tablet TAKE ONE TABLET BY MOUTH EVERY NIGHT AT BEDTIME 03/09/24   Cleatus Arlyss RAMAN, MD  SYRINGE-NEEDLE, DISP, 3 ML 25G X 1 3 ML MISC Use with B12 injection 07/07/22   Cleatus Arlyss RAMAN, MD    Allergies: Chantix [varenicline], Codeine, and Metformin  and related    Review of Systems  All other systems reviewed and are negative.   Updated Vital Signs BP (!) 145/80   Pulse 61   Temp 97.7 F (36.5 C) (Oral)   Resp 18   SpO2 97%   Physical Exam Vitals and nursing note reviewed. Exam conducted with a chaperone present.  Constitutional:      General: He is not in acute  distress.    Appearance: Normal appearance.  HENT:     Head: Normocephalic and atraumatic.  Eyes:     General:        Right eye: No discharge.        Left eye: No discharge.  Cardiovascular:     Comments: Regular rate and rhythm.  S1/S2 are distinct without any evidence of murmur, rubs, or gallops.  Radial pulses are 2+ bilaterally.  Dorsalis pedis pulses are 2+ bilaterally.  No evidence of pedal edema. Pulmonary:     Comments: Clear to auscultation bilaterally.  Normal effort.  No respiratory distress.  No evidence of wheezes, rales, or rhonchi heard throughout. Abdominal:     General: Abdomen is flat. Bowel sounds are normal. There is no distension.      Tenderness: There is no abdominal tenderness. There is no guarding or rebound.  Genitourinary:    Comments: Testicles are in normal appearance and lie.  There is a large fluctuant appearing and indurated area in the left groin encompassing part of the left side of the scrotum superior to the testicle.  Does not appear to be involved with the testicle.  Small area of opening on the inferior aspect but is not actively draining. Musculoskeletal:        General: Normal range of motion.     Cervical back: Neck supple.  Skin:    General: Skin is warm and dry.     Findings: No rash.  Neurological:     General: No focal deficit present.     Mental Status: He is alert.  Psychiatric:        Mood and Affect: Mood normal.        Behavior: Behavior normal.     (all labs ordered are listed, but only abnormal results are displayed) Labs Reviewed - No data to display  EKG: None  Radiology: DG Hip Unilat W or Wo Pelvis 2-3 Views Left Result Date: 07/28/2024 CLINICAL DATA:  Left hip pain, fell 3 weeks ago EXAM: DG HIP (WITH OR WITHOUT PELVIS) 2-3V LEFT COMPARISON:  None Available. FINDINGS: Frontal view of the pelvis as well as frontal and frogleg lateral views of the left hip are obtained. No acute fracture, subluxation, or dislocation. Joint spaces are well preserved. Sacroiliac joints are unremarkable. IMPRESSION: 1. Unremarkable pelvis and left hip. Electronically Signed   By: Ozell Daring M.D.   On: 07/28/2024 17:34     .Ultrasound ED Soft Tissue  Date/Time: 07/28/2024 9:00 PM  Performed by: Theotis Cameron HERO, PA-C Authorized by: Theotis Cameron HERO, PA-C   Procedure details:    Indications: localization of abscess     Transverse view:  Visualized   Longitudinal view:  Visualized   Images: not archived   Location:    Location: groin     Side:  Left Findings:     abscess present    cellulitis present Comments:     There is no obvious fluid collection.  There is moderate to severe  amount of cobblestoning along the area of induration. .Incision and Drainage  Date/Time: 07/28/2024 9:01 PM  Performed by: Theotis Cameron HERO, PA-C Authorized by: Theotis Cameron HERO, PA-C   Consent:    Consent obtained:  Verbal   Consent given by:  Patient   Risks discussed:  Bleeding Universal protocol:    Procedure explained and questions answered to patient or proxy's satisfaction: yes     Relevant documents present and verified: yes     Test results  available : yes     Imaging studies available: yes     Required blood products, implants, devices, and special equipment available: yes     Site/side marked: yes     Immediately prior to procedure, a time out was called: no     Patient identity confirmed:  Verbally with patient and arm band Location:    Type:  Abscess   Size:  8 cm   Location:  Anogenital   Anogenital location:  Scrotal wall Pre-procedure details:    Skin preparation:  Povidone-iodine Sedation:    Sedation type:  None Anesthesia:    Anesthesia method:  Topical application and local infiltration   Topical anesthetic:  LET   Local anesthetic:  Lidocaine  1% w/o epi Procedure type:    Complexity:  Complex Procedure details:    Ultrasound guidance: yes     Needle aspiration: no     Incision types:  Stab incision   Incision depth:  Dermal   Drainage:  Bloody   Drainage amount:  Scant   Wound treatment:  Wound left open   Packing materials:  None Post-procedure details:    Procedure completion:  Tolerated well, no immediate complications    Medications Ordered in the ED  lidocaine -EPINEPHrine -tetracaine  (LET) topical gel (3 mLs Topical Given 07/28/24 1727)  lidocaine  (PF) (XYLOCAINE ) 1 % injection 10 mL (10 mLs Other Given 07/28/24 1728)  sulfamethoxazole -trimethoprim  (BACTRIM  DS) 800-160 MG per tablet 1 tablet (1 tablet Oral Given 07/28/24 1943)     Medical Decision Making This patient presents to the ED for concern of scrotal abscess, this involves an  extensive number of treatment options, and is a complaint that carries with it a high risk of complications and morbidity.  The differential diagnosis includes scrotal abscess   Co morbidities that complicate the patient evaluation  Diabetes   Additional history obtained:  Additional history and/or information obtained from chart review, notable for Darryle Law, ER visit where patient had previous right sided scrotal abscess drained   Lab Tests:  None   Imaging Studies ordered:  I ordered imaging studies including left hip x-ray I independently visualized and interpreted imaging which showed no acute findings I agree with the radiologist interpretation   Cardiac Monitoring:  The patient was maintained on a cardiac monitor.  I personally viewed and interpreted the cardiac monitored which showed an underlying rhythm of: Normal sinus rhythm   Medicines ordered and prescription drug management:  I ordered medication including let gel and 1% plain lidocaine  for local and infiltrative anesthesia Reevaluation of the patient after these medicines showed that the patient improved I have reviewed the patients home medicines and have made adjustments as needed   Test Considered:  Scrotal ultrasound however, testicles were normal lie in appearance and nontender.  I have a low suspicion at this time for testicular torsion.  This seems to be isolated scrotal wall abscess which I could identify with bedside ultrasound in the emergency department   Critical Interventions:  Local anesthesia for pain control   Consultations Obtained:  None   Problem List / ED Course:  See clinical course   Reevaluation:  After the interventions noted above, I reevaluated the patient and found that they have :improved   Social Determinants of Health:  Married Elderly   Disposition:  After consideration of the diagnostic results and the patients response to treatment, I feel that the  patient would benefit from discharge with possible follow-up with urology.  Placed on Bactrim .  Strict turn precautions were discussed.  He is safe for discharge.   Amount and/or Complexity of Data Reviewed Radiology: ordered. Decision-making details documented in ED Course.  Risk Prescription drug management.     Final diagnoses:  Scrotal abscess    ED Discharge Orders          Ordered    sulfamethoxazole -trimethoprim  (BACTRIM  DS) 800-160 MG tablet  2 times daily        07/28/24 1916               Theotis Cameron HERO, DEVONNA 07/28/24 2107    Ruthe Cornet, DO 07/28/24 2158

## 2024-07-28 NOTE — ED Triage Notes (Signed)
 Abscess also noted on left groin that started draining yesterday. No fevers at home.   Fall 3 weeks ago on left hip with persistent pain to left hip and glute

## 2024-07-31 ENCOUNTER — Other Ambulatory Visit: Payer: Self-pay | Admitting: Urology

## 2024-08-01 ENCOUNTER — Ambulatory Visit (HOSPITAL_COMMUNITY): Admitting: Anesthesiology

## 2024-08-01 ENCOUNTER — Encounter: Admission: RE | Disposition: A | Payer: Self-pay | Attending: Urology

## 2024-08-01 ENCOUNTER — Encounter (HOSPITAL_COMMUNITY): Payer: Self-pay | Admitting: Urology

## 2024-08-01 ENCOUNTER — Ambulatory Visit (HOSPITAL_COMMUNITY): Admission: RE | Admit: 2024-08-01 | Discharge: 2024-08-01 | Disposition: A | Attending: Urology | Admitting: Urology

## 2024-08-01 ENCOUNTER — Other Ambulatory Visit: Payer: Self-pay

## 2024-08-01 DIAGNOSIS — E119 Type 2 diabetes mellitus without complications: Secondary | ICD-10-CM | POA: Diagnosis not present

## 2024-08-01 DIAGNOSIS — K219 Gastro-esophageal reflux disease without esophagitis: Secondary | ICD-10-CM | POA: Diagnosis not present

## 2024-08-01 DIAGNOSIS — M1990 Unspecified osteoarthritis, unspecified site: Secondary | ICD-10-CM | POA: Insufficient documentation

## 2024-08-01 DIAGNOSIS — E1165 Type 2 diabetes mellitus with hyperglycemia: Secondary | ICD-10-CM | POA: Diagnosis not present

## 2024-08-01 DIAGNOSIS — N492 Inflammatory disorders of scrotum: Secondary | ICD-10-CM | POA: Diagnosis present

## 2024-08-01 DIAGNOSIS — F17210 Nicotine dependence, cigarettes, uncomplicated: Secondary | ICD-10-CM | POA: Diagnosis not present

## 2024-08-01 DIAGNOSIS — G709 Myoneural disorder, unspecified: Secondary | ICD-10-CM | POA: Insufficient documentation

## 2024-08-01 DIAGNOSIS — Z8249 Family history of ischemic heart disease and other diseases of the circulatory system: Secondary | ICD-10-CM | POA: Insufficient documentation

## 2024-08-01 DIAGNOSIS — I442 Atrioventricular block, complete: Secondary | ICD-10-CM | POA: Diagnosis not present

## 2024-08-01 DIAGNOSIS — Z950 Presence of cardiac pacemaker: Secondary | ICD-10-CM | POA: Diagnosis not present

## 2024-08-01 DIAGNOSIS — I2510 Atherosclerotic heart disease of native coronary artery without angina pectoris: Secondary | ICD-10-CM

## 2024-08-01 HISTORY — PX: INCISION AND DRAINAGE, ABSCESS, GENITALIA: SHX7441

## 2024-08-01 LAB — CBC
HCT: 53.7 % — ABNORMAL HIGH (ref 39.0–52.0)
Hemoglobin: 17.8 g/dL — ABNORMAL HIGH (ref 13.0–17.0)
MCH: 31.2 pg (ref 26.0–34.0)
MCHC: 33.1 g/dL (ref 30.0–36.0)
MCV: 94.2 fL (ref 80.0–100.0)
Platelets: 207 K/uL (ref 150–400)
RBC: 5.7 MIL/uL (ref 4.22–5.81)
RDW: 12.6 % (ref 11.5–15.5)
WBC: 8.2 K/uL (ref 4.0–10.5)
nRBC: 0 % (ref 0.0–0.2)

## 2024-08-01 LAB — HEMOGLOBIN A1C
Hgb A1c MFr Bld: 11.3 % — ABNORMAL HIGH (ref 4.8–5.6)
Mean Plasma Glucose: 277.61 mg/dL

## 2024-08-01 LAB — BASIC METABOLIC PANEL WITH GFR
Anion gap: 16 — ABNORMAL HIGH (ref 5–15)
BUN: 19 mg/dL (ref 8–23)
CO2: 21 mmol/L — ABNORMAL LOW (ref 22–32)
Calcium: 10.5 mg/dL — ABNORMAL HIGH (ref 8.9–10.3)
Chloride: 101 mmol/L (ref 98–111)
Creatinine, Ser: 1.1 mg/dL (ref 0.61–1.24)
GFR, Estimated: >60 mL/min (ref >60)
Glucose, Bld: 225 mg/dL — ABNORMAL HIGH (ref 70–99)
Potassium: 4.5 mmol/L (ref 3.5–5.1)
Sodium: 137 mmol/L (ref 135–145)

## 2024-08-01 LAB — GLUCOSE, CAPILLARY
Glucose-Capillary: 212 mg/dL — ABNORMAL HIGH (ref 70–99)
Glucose-Capillary: 167 mg/dL — ABNORMAL HIGH (ref 70–99)

## 2024-08-01 SURGERY — INCISION AND DRAINAGE, ABSCESS, GENITALIA
Anesthesia: General | Site: Scrotum

## 2024-08-01 MED ORDER — HYDROMORPHONE HCL 1 MG/ML IJ SOLN
INTRAMUSCULAR | Status: AC
  Filled 2024-08-01: qty 1

## 2024-08-01 MED ORDER — BUPIVACAINE HCL (PF) 0.25 % IJ SOLN
INTRAMUSCULAR | Status: DC | PRN
  Administered 2024-08-01: 20 mL

## 2024-08-01 MED ORDER — CHLORHEXIDINE GLUCONATE 0.12 % MT SOLN
15.0000 mL | Freq: Once | OROMUCOSAL | Status: AC
  Administered 2024-08-01: 15 mL via OROMUCOSAL

## 2024-08-01 MED ORDER — OXYCODONE-ACETAMINOPHEN 5-325 MG PO TABS: 1.0000 | ORAL_TABLET | Freq: Four times a day (QID) | ORAL | 0 refills | Status: AC | PRN

## 2024-08-01 MED ORDER — ONDANSETRON HCL 4 MG/2ML IJ SOLN
INTRAMUSCULAR | Status: AC
  Filled 2024-08-01: qty 2

## 2024-08-01 MED ORDER — PROPOFOL 10 MG/ML IV BOLUS
INTRAVENOUS | Status: AC
  Filled 2024-08-01: qty 20

## 2024-08-01 MED ORDER — PROPOFOL 10 MG/ML IV BOLUS
INTRAVENOUS | Status: DC | PRN
  Administered 2024-08-01: 200 mg via INTRAVENOUS

## 2024-08-01 MED ORDER — ONDANSETRON HCL 4 MG/2ML IJ SOLN: 4.0000 mg | Freq: Once | INTRAMUSCULAR | Status: DC | PRN

## 2024-08-01 MED ORDER — LACTATED RINGERS IV SOLN: INTRAVENOUS | Status: DC | PRN

## 2024-08-01 MED ORDER — OXYCODONE HCL 5 MG PO TABS
ORAL_TABLET | ORAL | Status: AC
  Filled 2024-08-01: qty 1

## 2024-08-01 MED ORDER — ACETAMINOPHEN 10 MG/ML IV SOLN
1000.0000 mg | Freq: Once | INTRAVENOUS | Status: DC | PRN
  Administered 2024-08-01: 1000 mg via INTRAVENOUS

## 2024-08-01 MED ORDER — EPHEDRINE SULFATE (PRESSORS) 25 MG/5ML IV SOSY
PREFILLED_SYRINGE | INTRAVENOUS | Status: DC | PRN
  Administered 2024-08-01: 10 mg via INTRAVENOUS

## 2024-08-01 MED ORDER — LIDOCAINE HCL (CARDIAC) PF 100 MG/5ML IV SOSY
PREFILLED_SYRINGE | INTRAVENOUS | Status: DC | PRN
  Administered 2024-08-01: 60 mg via INTRAVENOUS

## 2024-08-01 MED ORDER — INSULIN ASPART 100 UNIT/ML IJ SOLN
0.0000 [IU] | INTRAMUSCULAR | Status: DC | PRN
  Administered 2024-08-01: 2 [IU] via SUBCUTANEOUS

## 2024-08-01 MED ORDER — BUPIVACAINE HCL (PF) 0.25 % IJ SOLN
INTRAMUSCULAR | Status: AC
  Filled 2024-08-01: qty 30

## 2024-08-01 MED ORDER — HYDROMORPHONE HCL 1 MG/ML IJ SOLN
0.2500 mg | INTRAMUSCULAR | Status: DC | PRN
  Administered 2024-08-01 (×4): 0.5 mg via INTRAVENOUS

## 2024-08-01 MED ORDER — OXYCODONE HCL 5 MG/5ML PO SOLN: 5.0000 mg | Freq: Once | ORAL | Status: AC | PRN

## 2024-08-01 MED ORDER — PIPERACILLIN-TAZOBACTAM 3.375 G IVPB 30 MIN
3.3750 g | Freq: Once | INTRAVENOUS | Status: AC
  Administered 2024-08-01: 3.375 g via INTRAVENOUS
  Filled 2024-08-01: qty 50

## 2024-08-01 MED ORDER — OXYCODONE HCL 5 MG PO TABS
5.0000 mg | ORAL_TABLET | Freq: Once | ORAL | Status: AC | PRN
  Administered 2024-08-01: 5 mg via ORAL

## 2024-08-01 MED ORDER — SENNOSIDES-DOCUSATE SODIUM 8.6-50 MG PO TABS: 1.0000 | ORAL_TABLET | Freq: Two times a day (BID) | ORAL | 0 refills | Status: AC

## 2024-08-01 MED ORDER — FENTANYL CITRATE (PF) 100 MCG/2ML IJ SOLN
INTRAMUSCULAR | Status: AC
  Filled 2024-08-01: qty 2

## 2024-08-01 MED ORDER — INSULIN ASPART 100 UNIT/ML IJ SOLN
INTRAMUSCULAR | Status: AC
  Filled 2024-08-01: qty 2

## 2024-08-01 MED ORDER — AMISULPRIDE (ANTIEMETIC) 5 MG/2ML IV SOLN: 10.0000 mg | Freq: Once | INTRAVENOUS | Status: DC | PRN

## 2024-08-01 MED ORDER — ACETAMINOPHEN 10 MG/ML IV SOLN
INTRAVENOUS | Status: AC
  Filled 2024-08-01: qty 100

## 2024-08-01 MED ORDER — FENTANYL CITRATE (PF) 100 MCG/2ML IJ SOLN
INTRAMUSCULAR | Status: DC | PRN
  Administered 2024-08-01 (×2): 25 ug via INTRAVENOUS
  Administered 2024-08-01: 50 ug via INTRAVENOUS

## 2024-08-01 MED ORDER — ONDANSETRON HCL 4 MG/2ML IJ SOLN
INTRAMUSCULAR | Status: DC | PRN
  Administered 2024-08-01: 4 mg via INTRAVENOUS

## 2024-08-01 MED ORDER — SULFAMETHOXAZOLE-TRIMETHOPRIM 800-160 MG PO TABS: 1.0000 | ORAL_TABLET | Freq: Two times a day (BID) | ORAL | 0 refills | Status: AC

## 2024-08-01 MED ORDER — LIDOCAINE HCL (PF) 2 % IJ SOLN
INTRAMUSCULAR | Status: AC
  Filled 2024-08-01: qty 5

## 2024-08-01 SURGICAL SUPPLY — 30 items
BAG COUNTER SPONGE SURGICOUNT (BAG) IMPLANT
BENZOIN TINCTURE PRP APPL 2/3 (GAUZE/BANDAGES/DRESSINGS) ×1 IMPLANT
BLADE HEX COATED 2.75 (ELECTRODE) ×1 IMPLANT
BLADE SURG 15 STRL LF DISP TIS (BLADE) ×1 IMPLANT
BNDG GAUZE DERMACEA FLUFF 4 (GAUZE/BANDAGES/DRESSINGS) ×1 IMPLANT
DERMABOND ADVANCED .7 DNX12 (GAUZE/BANDAGES/DRESSINGS) ×1 IMPLANT
DRAIN PENROSE 0.25X18 (DRAIN) ×1 IMPLANT
DRAIN PENROSE 0.5X18 (DRAIN) ×1 IMPLANT
DRAPE LAPAROTOMY T 98X78 PEDS (DRAPES) ×1 IMPLANT
ELECT REM PT RETURN 15FT ADLT (MISCELLANEOUS) ×1 IMPLANT
GAUZE PAD ABD 8X10 STRL (GAUZE/BANDAGES/DRESSINGS) IMPLANT
GAUZE SPONGE 4X4 12PLY STRL (GAUZE/BANDAGES/DRESSINGS) ×1 IMPLANT
GLOVE SURG LX STRL 7.5 STRW (GLOVE) ×1 IMPLANT
GOWN SRG XL LVL 4 BRTHBL STRL (GOWNS) ×1 IMPLANT
KIT BASIN OR (CUSTOM PROCEDURE TRAY) ×1 IMPLANT
KIT TURNOVER KIT A (KITS) ×1 IMPLANT
NDL HYPO 22X1.5 SAFETY MO (MISCELLANEOUS) IMPLANT
NEEDLE HYPO 22X1.5 SAFETY MO (MISCELLANEOUS) IMPLANT
NS IRRIG 1000ML POUR BTL (IV SOLUTION) IMPLANT
PACK BASIC VI WITH GOWN DISP (CUSTOM PROCEDURE TRAY) ×1 IMPLANT
PENCIL SMOKE EVACUATOR (MISCELLANEOUS) IMPLANT
SET HNDPC FAN SPRY TIP SCT (DISPOSABLE) IMPLANT
SPONGE T-LAP 4X18 ~~LOC~~+RFID (SPONGE) ×2 IMPLANT
SUPPORTER AHLETIC TETRA LG (SOFTGOODS) ×1 IMPLANT
SUT MNCRL AB 4-0 PS2 18 (SUTURE) ×1 IMPLANT
SUT SILK 0 30XBRD TIE 6 (SUTURE) ×1 IMPLANT
SUT VIC AB 3-0 SH 27XBRD (SUTURE) ×1 IMPLANT
SYR 20ML LL LF (SYRINGE) ×1 IMPLANT
SYR CONTROL 10ML LL (SYRINGE) IMPLANT
WATER STERILE IRR 1000ML POUR (IV SOLUTION) IMPLANT

## 2024-08-01 NOTE — Discharge Instructions (Addendum)
 1 - You may have drainage from area of wound on / off x several weeks as slowly heals. This is normal. Clean area by showering at least daily allowing warm soapy water to flow over wound.   2 - Please keep track of sugars, when they run high it drastically increases infection risk  3- Call MD or go to ER for fever >102, severe pain / nausea / vomiting not relieved by medications, or acute change in medical status

## 2024-08-01 NOTE — Op Note (Unsigned)
 NAME: Carl Barnes, Carl Barnes MEDICAL RECORD NO: [id number] ACCOUNT NO: [id number] DATE OF BIRTH: 1951-04-27 FACILITY: [facility] LOCATION: [facility] PHYSICIAN: Ricardo Likens, MD  Operative Report   DATE OF PROCEDURE: 08/01/2024  SURGEON:  Ricardo Likens, MD  PREOPERATIVE DIAGNOSES:  Recurrent scrotal abscesses with recurrence.  PROCEDURE PERFORMED:  Incision and drainage of scrotal abscess.  ESTIMATED BLOOD LOSS:  10 mL.  COMPLICATIONS:  None.  SPECIMENS:  Abscess fluid for Gram stain and culture.  FINDINGS:  Approximately 50 mL volume left lateral scrotal abscess, without evidence of necrotizing infection.   DRAINS:  *** Penrose drain 2 wound drainage.  INDICATIONS:  The patient is a 73 year old man with history of diabetes that is very poorly controlled; his A1c tends to range in the 10 range.  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  He was noted on evaluation of recurrent perineal scrotal pain to have a significant abscess.  In the ER most recently, he was sent home on oral antibiotics.  He presented to our office yesterday for follow up of this and was  noted to have a large fluctuant abscess that clearly oral medicine alone would have a very unlikely chance of healing.  Options discussed including recommended path of incision and drainage.  He presents for this today.   Informed consent was obtained  and placed in the medical record.  DESCRIPTION OF PROCEDURE:  The patient being Carl Barnes identified and verified and the procedure being incision and drainage of large scrotal abscess was confirmed.  Procedure timeout was performed.  Intravenous antibiotics were administered.  General  LMA anesthesia was administered.  The patient was placed into a low lithotomy position.  Sterile field was created, prepped and draped the patient's penis, perineum, proximal thigh, and scrotum using iodine after  clipper shaving.  Point of maximum  fluctuance was noted in the left lateral scrotal area in an ovoid abscess cavity.  The inferior aspect of this was incised for approximately 1.5 cm.  Copious purulent fluid immediately escaped.  A sample of this was set aside for Gram stain and culture.   A counterincision was made at the superior aspect of this approximately 4 cm superior to the superior aspect of the prior inferior incision.  This was connected using a Kelly forceps.  Then the abscess cavity was copiously drained using approximately  300 mL of sterile saline until all aggressively purulent material had been easily irrigated.  Additional hemostasis was achieved with point coagulation current Bovie.  Palpation revealed no evidence of necrotizing infection or tracking to the thighs,  buttocks, or abdominal wall or penile structures.  This appeared to be a focal cavity.  A 0.5-inch Penrose drain was then brought through the 2 incisions and anchored in place using 3-0 nylon at each site.  This was trimmed to length.  Hemostasis was  excellent.  We achieved the goals of the procedure today.  A dressing of ABD pad followed by mesh underwear was placed.  The procedure was terminated.  The patient tolerated the procedure well; no immediate periprocedural complications.  The patient was  taken to postanesthesia care unit in stable condition.  Plan for discharge home.    MUK D: 08/01/2024 6:08:28 pm T: 08/01/2024 10:18:00 pm  JOB: [id number]/ [id number]

## 2024-08-01 NOTE — Transfer of Care (Signed)
 Immediate Anesthesia Transfer of Care Note  Patient: Carl Barnes  Procedure(s) Performed: INCISION AND DRAINAGE, ABSCESS, GENITALIA (Scrotum)  Patient Location: PACU  Anesthesia Type:General  Level of Consciousness: awake and alert   Airway & Oxygen Therapy: Patient Spontanous Breathing and Patient connected to face mask oxygen  Post-op Assessment: Report given to RN and Post -op Vital signs reviewed and stable  Post vital signs: Reviewed and stable  Last Vitals:  Vitals Value Taken Time  BP 142/77 08/01/24 18:22  Temp    Pulse 63 08/01/24 18:25  Resp 12 08/01/24 18:25  SpO2 97 % 08/01/24 18:25  Vitals shown include unfiled device data.  Last Pain:  Vitals:   08/01/24 1454  TempSrc:   PainSc: 2          Complications: No notable events documented.

## 2024-08-01 NOTE — Anesthesia Procedure Notes (Signed)
 Procedure Name: LMA Insertion Date/Time: 08/01/2024 5:54 PM  Performed by: Vincenzo Show, CRNAPre-anesthesia Checklist: Patient identified, Emergency Drugs available, Suction available, Patient being monitored and Timeout performed Patient Re-evaluated:Patient Re-evaluated prior to induction Oxygen Delivery Method: Circle system utilized Preoxygenation: Pre-oxygenation with 100% oxygen Induction Type: IV induction Ventilation: Mask ventilation without difficulty LMA: LMA with gastric port inserted LMA Size: 4.0 Tube size: 4.0 mm Number of attempts: 1 Placement Confirmation: positive ETCO2 and breath sounds checked- equal and bilateral Tube secured with: Tape Dental Injury: Teeth and Oropharynx as per pre-operative assessment

## 2024-08-01 NOTE — Brief Op Note (Signed)
 08/01/2024  6:04 PM  PATIENT:  Carl Barnes  73 y.o. male  PRE-OPERATIVE DIAGNOSIS:  ABSCESS OF SCROTUM  POST-OPERATIVE DIAGNOSIS:  ABSCESS OF SCROTUM  PROCEDURE:  Procedure(s): INCISION AND DRAINAGE, ABSCESS, GENITALIA (N/A)  SURGEON:  Surgeons and Role:    * Manny, Ricardo KATHEE Raddle., MD - Primary  PHYSICIAN ASSISTANT:   ASSISTANTS: none   ANESTHESIA:   local and general  EBL:  10mL   BLOOD ADMINISTERED:none  DRAINS: Penrose drain in the left lateral scrotum   LOCAL MEDICATIONS USED:  MARCAINE      SPECIMEN:  Source of Specimen:  abscess fluid  DISPOSITION OF SPECIMEN:  microbiology  COUNTS:  YES  TOURNIQUET:  * No tourniquets in log *  DICTATION: .Other Dictation: Dictation Number [id number]  PLAN OF CARE: Discharge to home after PACU  PATIENT DISPOSITION:  PACU - hemodynamically stable.   Delay start of Pharmacological VTE agent (>24hrs) due to surgical blood loss or risk of bleeding: not applicable

## 2024-08-01 NOTE — Anesthesia Postprocedure Evaluation (Signed)
 Anesthesia Post Note  Patient: Carl Barnes  Procedure(s) Performed: INCISION AND DRAINAGE, ABSCESS, GENITALIA (Scrotum)     Patient location during evaluation: PACU Anesthesia Type: General Level of consciousness: awake and alert Pain management: pain level controlled Vital Signs Assessment: post-procedure vital signs reviewed and stable Respiratory status: spontaneous breathing, nonlabored ventilation, respiratory function stable and patient connected to nasal cannula oxygen Cardiovascular status: blood pressure returned to baseline and stable Postop Assessment: no apparent nausea or vomiting Anesthetic complications: no   No notable events documented.  Last Vitals:  Vitals:   08/01/24 1915 08/01/24 1930  BP: (!) 147/85 (!) 159/81  Pulse: 64 60  Resp: 15 (!) 7  Temp:  36.4 C  SpO2: 92% 93%    Last Pain:  Vitals:   08/01/24 1917  TempSrc:   PainSc: 4                  Epifanio Lamar BRAVO

## 2024-08-01 NOTE — H&P (Signed)
 Carl Barnes is an 73 y.o. male.    Chief Complaint: Pre-OP I+D Recurrent Scrotal Abscess  HPI:   1 - Recurrent Scrotal Abscess - admittedly poorly controlled diabetic with every few years cutaneous abscesses in various locations now with left scrotal abscess. NO fevers. Placed on Bactrim  by ER. Prior CX's pan-sensitive strep.  PMH sig for IDDM2 (A1c 10s), complete heart block / pacer (follows Medford Hanson MD [facility]), no ischemic CV disease.   Today Carl Barnes is seen to proceed with scrotal I+D. NO interval fevers. A1c 10, Cr 0.8 most recently.      Past Medical History:  Diagnosis Date   Arthritis    Cholangitis (HCC) 07/2010   [facility] admission - ERCP for stone removal per GI   Diabetes mellitus without complication (HCC)    Elevated glucose 10/16 to 06/09/2004   [facility] admission, mild elevated cholesterol   GERD (gastroesophageal reflux disease)    History of echocardiogram    a. 05/2017 Echo: EF 55-60%, mild LVH, no rwma, mild MR.   Hyperlipidemia 12/21/1997   Non-obstructive CAD (coronary artery disease)    a. 05/2016 Abnl St Test: intermediate risk, large, partially reversible inf/infsept defect consistent w/ ischemia/? scar;  b. 06/2016 Cath: LM nl, LAD 50m, RI nl, LCX nl, OM1/2/3 nl, RCA 40p/m, 30d, RPDA/RPAV nl.   Poisoning by black widow spider bite 1979   RBBB    Renal stones    Syncope    Tobacco abuse     Past Surgical History:  Procedure Laterality Date   CARDIAC CATHETERIZATION Left 07/06/2016   Procedure: Left Heart Cath and Coronary Angiography;  Surgeon: Lonni Hanson, MD;  Location: [facility] INVASIVE CV LAB;  Service: Cardiovascular;  Laterality: Left;   Cardiolite  06/08/2004   WNL   CATARACT EXTRACTION W/ INTRAOCULAR LENS IMPLANT Right 10/13/2017   Dr. Patrcia   CATARACT EXTRACTION W/ INTRAOCULAR LENS IMPLANT Left 09/08/2017   Dr.Tanner   Chest CT w/contrast  06/08/2004   Negative PE   CHOLECYSTECTOMY  01/2011   Diseased GB.  [facility]   EYE SURGERY      Head MRI  07/1998   Normal (R/O acoustic neuroma)   PACEMAKER IMPLANT N/A 03/07/2023   Procedure: PACEMAKER IMPLANT;  Surgeon: Cindie Ole DASEN, MD;  Location: [facility] INVASIVE CV LAB;  Service: Cardiovascular;  Laterality: N/A;    Family History  Problem Relation Age of Onset   Cancer Mother        Pancreatic   Hyperlipidemia Mother    CVA Father    COPD Father    Hearing loss Father    Stroke Father    Stroke Brother    Heart disease Brother    Heart disease Paternal Grandfather        MI   Diabetes Neg Hx    Alcohol abuse Neg Hx    Drug abuse Neg Hx    Depression Neg Hx    Colon cancer Neg Hx    Prostate cancer Neg Hx    Social History:  reports that he has been smoking cigarettes. He has a 17.5 pack-year smoking history. He has never used smokeless tobacco. He reports current alcohol use. He reports that he does not use drugs.  Allergies:  Allergies  Allergen Reactions   Chantix [Varenicline] Other (See Comments)    Intolerant    Codeine Other (See Comments)    GI upset   Metformin  And Related Diarrhea    Max dose 500mg -1000mg  a day  No medications prior to admission.    No results found for this or any previous visit (from the past 48 hours). No results found.  Review of Systems  Constitutional:  Positive for fatigue.  Genitourinary:  Positive for genital sores and scrotal swelling.  All other systems reviewed and are negative.   There were no vitals taken for this visit. Physical Exam Vitals reviewed.  HENT:     Head: Normocephalic.     Mouth/Throat:     Mouth: Mucous membranes are moist.  Eyes:     Pupils: Pupils are equal, round, and reactive to light.  Cardiovascular:     Rate and Rhythm: Normal rate.  Pulmonary:     Effort: Pulmonary effort is normal.  Abdominal:     General: Bowel sounds are normal.  Genitourinary:    Comments: 6cm left lateral scrotal area of significant induration and fluctuecne w/o crepitus.  Musculoskeletal:         General: Normal range of motion.     Cervical back: Normal range of motion.  Skin:    General: Skin is warm.  Neurological:     General: No focal deficit present.     Mental Status: He is alert.  Psychiatric:        Mood and Affect: Mood normal.      Assessment/Plan  Proceed as planned with scrotal I+D. Risks, benefits, alternatives, need for staged debridement, need for possible chronic wound packing / management, discussed previously and reiterated today. Frankly discussed with pt and wife that this is a dangerous sequelae of his diabetes and urged better glycemic control and dietary adherence. His CV and metabolic comorbidity increases risk of ALL peri-op complications including mortality.    Ricardo KATHEE Alvaro Mickey., MD 08/01/2024, 6:22 AM

## 2024-08-01 NOTE — Anesthesia Preprocedure Evaluation (Signed)
 Anesthesia Evaluation  Patient identified by MRN, date of birth, ID band Patient awake    Reviewed: Allergy & Precautions, NPO status , Patient's Chart, lab work & pertinent test results  Airway Mallampati: II  TM Distance: >3 FB Neck ROM: Full    Dental no notable dental hx. (+) Teeth Intact, Dental Advisory Given   Pulmonary Current SmokerPatient did not abstain from smoking.   Pulmonary exam normal breath sounds clear to auscultation       Cardiovascular (-) hypertension(-) angina (-) Past MI Normal cardiovascular exam+ pacemaker (for compleet HB)  Rhythm:Regular Rate:Normal     Neuro/Psych  Neuromuscular disease    GI/Hepatic ,GERD  ,,  Endo/Other  diabetes, Well Controlled, Type 2, Oral Hypoglycemic Agents    Renal/GU Renal disease     Musculoskeletal  (+) Arthritis ,    Abdominal   Peds  Hematology   Anesthesia Other Findings   Reproductive/Obstetrics                              Anesthesia Physical Anesthesia Plan  ASA: 3  Anesthesia Plan: General   Post-op Pain Management: Tylenol  PO (pre-op)*   Induction: Intravenous  PONV Risk Score and Plan: Treatment may vary due to age or medical condition, Midazolam , Ondansetron  and Dexamethasone  Airway Management Planned: LMA  Additional Equipment: None  Intra-op Plan:   Post-operative Plan: Extubation in OR  Informed Consent: I have reviewed the patients History and Physical, chart, labs and discussed the procedure including the risks, benefits and alternatives for the proposed anesthesia with the patient or authorized representative who has indicated his/her understanding and acceptance.     Dental advisory given  Plan Discussed with: CRNA and Surgeon  Anesthesia Plan Comments:          Anesthesia Quick Evaluation

## 2024-08-02 ENCOUNTER — Ambulatory Visit: Payer: Self-pay | Admitting: Family Medicine

## 2024-08-02 ENCOUNTER — Encounter (HOSPITAL_COMMUNITY): Payer: Self-pay | Admitting: Urology

## 2024-08-08 LAB — AEROBIC/ANAEROBIC CULTURE W GRAM STAIN (SURGICAL/DEEP WOUND): Culture: NO GROWTH

## 2024-09-03 ENCOUNTER — Ambulatory Visit (INDEPENDENT_AMBULATORY_CARE_PROVIDER_SITE_OTHER): Payer: Medicare Other

## 2024-09-03 DIAGNOSIS — I442 Atrioventricular block, complete: Secondary | ICD-10-CM

## 2024-09-04 LAB — CUP PACEART REMOTE DEVICE CHECK
Battery Remaining Longevity: 125 mo
Battery Voltage: 3.03 V
Brady Statistic AP VP Percent: 44.99 %
Brady Statistic AP VS Percent: 0 %
Brady Statistic AS VP Percent: 54.99 %
Brady Statistic AS VS Percent: 0.01 %
Brady Statistic RA Percent Paced: 44.98 %
Brady Statistic RV Percent Paced: 99.99 %
Date Time Interrogation Session: 20260111235158
Implantable Lead Connection Status: 753985
Implantable Lead Connection Status: 753985
Implantable Lead Implant Date: 20240715
Implantable Lead Implant Date: 20240715
Implantable Lead Location: 753859
Implantable Lead Location: 753860
Implantable Lead Model: 3830
Implantable Lead Model: 5076
Implantable Pulse Generator Implant Date: 20240715
Lead Channel Impedance Value: 323 Ohm
Lead Channel Impedance Value: 361 Ohm
Lead Channel Impedance Value: 494 Ohm
Lead Channel Impedance Value: 627 Ohm
Lead Channel Pacing Threshold Amplitude: 0.625 V
Lead Channel Pacing Threshold Amplitude: 1.75 V
Lead Channel Pacing Threshold Pulse Width: 0.4 ms
Lead Channel Pacing Threshold Pulse Width: 0.4 ms
Lead Channel Sensing Intrinsic Amplitude: 1.5 mV
Lead Channel Sensing Intrinsic Amplitude: 1.5 mV
Lead Channel Sensing Intrinsic Amplitude: 25.5 mV
Lead Channel Sensing Intrinsic Amplitude: 25.5 mV
Lead Channel Setting Pacing Amplitude: 1.5 V
Lead Channel Setting Pacing Amplitude: 2.75 V
Lead Channel Setting Pacing Pulse Width: 0.4 ms
Lead Channel Setting Sensing Sensitivity: 1.2 mV
Zone Setting Status: 755011

## 2024-09-06 NOTE — Progress Notes (Signed)
 Remote PPM Transmission

## 2024-09-09 ENCOUNTER — Ambulatory Visit: Payer: Self-pay | Admitting: Cardiology

## 2024-09-14 NOTE — Progress Notes (Signed)
 Carl Barnes                                          MRN: [id number]   09/14/2024   The [facility] Quality Team Specialist reviewed this patient medical record for the purposes of chart review for care gap closure. The following were reviewed: chart review for care gap closure-glycemic status assessment.    [facility] Quality Team

## 2024-09-20 ENCOUNTER — Other Ambulatory Visit (INDEPENDENT_AMBULATORY_CARE_PROVIDER_SITE_OTHER)

## 2024-09-20 DIAGNOSIS — Z794 Long term (current) use of insulin: Secondary | ICD-10-CM

## 2024-09-20 DIAGNOSIS — E1169 Type 2 diabetes mellitus with other specified complication: Secondary | ICD-10-CM

## 2024-09-20 LAB — HEMOGLOBIN A1C: Hgb A1c MFr Bld: 10.7 % — ABNORMAL HIGH (ref 4.6–6.5)

## 2024-09-23 LAB — FRUCTOSAMINE: Fructosamine: 400 umol/L — ABNORMAL HIGH (ref 205–285)

## 2024-09-27 ENCOUNTER — Ambulatory Visit: Admitting: Family Medicine

## 2024-10-05 ENCOUNTER — Ambulatory Visit: Admitting: Family Medicine

## 2024-12-19 ENCOUNTER — Ambulatory Visit
